# Patient Record
Sex: Female | Born: 1965 | Race: White | Hispanic: No | Marital: Married | Smoking: Current every day smoker
Health system: Southern US, Community
[De-identification: ages and names within clinical notes are randomized; demographics above are authoritative.]

## PROBLEM LIST (undated history)

## (undated) DIAGNOSIS — R319 Hematuria, unspecified: Secondary | ICD-10-CM

## (undated) DIAGNOSIS — A6000 Herpesviral infection of urogenital system, unspecified: Secondary | ICD-10-CM

## (undated) DIAGNOSIS — K219 Gastro-esophageal reflux disease without esophagitis: Secondary | ICD-10-CM

## (undated) DIAGNOSIS — N3281 Overactive bladder: Secondary | ICD-10-CM

## (undated) DIAGNOSIS — E669 Obesity, unspecified: Secondary | ICD-10-CM

## (undated) DIAGNOSIS — R0683 Snoring: Secondary | ICD-10-CM

## (undated) DIAGNOSIS — N879 Dysplasia of cervix uteri, unspecified: Secondary | ICD-10-CM

## (undated) DIAGNOSIS — N898 Other specified noninflammatory disorders of vagina: Secondary | ICD-10-CM

## (undated) DIAGNOSIS — N3946 Mixed incontinence: Secondary | ICD-10-CM

## (undated) DIAGNOSIS — I2699 Other pulmonary embolism without acute cor pulmonale: Secondary | ICD-10-CM

## (undated) DIAGNOSIS — M797 Fibromyalgia: Secondary | ICD-10-CM

## (undated) DIAGNOSIS — Z8639 Personal history of other endocrine, nutritional and metabolic disease: Secondary | ICD-10-CM

## (undated) DIAGNOSIS — I639 Cerebral infarction, unspecified: Secondary | ICD-10-CM

## (undated) DIAGNOSIS — G35 Multiple sclerosis: Secondary | ICD-10-CM

## (undated) DIAGNOSIS — J449 Chronic obstructive pulmonary disease, unspecified: Secondary | ICD-10-CM

## (undated) DIAGNOSIS — I82409 Acute embolism and thrombosis of unspecified deep veins of unspecified lower extremity: Secondary | ICD-10-CM

## (undated) DIAGNOSIS — R3129 Other microscopic hematuria: Principal | ICD-10-CM

## (undated) DIAGNOSIS — Z862 Personal history of diseases of the blood and blood-forming organs and certain disorders involving the immune mechanism: Secondary | ICD-10-CM

## (undated) DIAGNOSIS — I10 Essential (primary) hypertension: Secondary | ICD-10-CM

## (undated) DIAGNOSIS — F419 Anxiety disorder, unspecified: Secondary | ICD-10-CM

## (undated) HISTORY — DX: Personal history of other endocrine, nutritional and metabolic disease: Z86.39

## (undated) HISTORY — PX: BLADDER SUSPENSION: SHX72

## (undated) HISTORY — DX: Other microscopic hematuria: R31.29

## (undated) HISTORY — DX: Dysplasia of cervix uteri, unspecified: N87.9

## (undated) HISTORY — PX: LASER ABLATION CONDYLOMA CERVICAL / VULVAR: SUR819

## (undated) HISTORY — PX: OTHER SURGICAL HISTORY: SHX169

## (undated) HISTORY — DX: Other pulmonary embolism without acute cor pulmonale: I26.99

## (undated) HISTORY — DX: Multiple sclerosis: G35

## (undated) HISTORY — DX: Hematuria, unspecified: R31.9

## (undated) HISTORY — DX: Fibromyalgia: M79.7

## (undated) HISTORY — DX: Snoring: R06.83

## (undated) HISTORY — PX: DILATION AND CURETTAGE OF UTERUS: SHX78

## (undated) HISTORY — PX: ABDOMINAL HYSTERECTOMY: SHX81

## (undated) HISTORY — DX: Mixed incontinence: N39.46

## (undated) HISTORY — DX: Obesity, unspecified: E66.9

## (undated) HISTORY — DX: Overactive bladder: N32.81

## (undated) HISTORY — DX: Chronic obstructive pulmonary disease, unspecified: J44.9

## (undated) HISTORY — DX: Other specified noninflammatory disorders of vagina: N89.8

## (undated) HISTORY — DX: Herpesviral infection of urogenital system, unspecified: A60.00

## (undated) HISTORY — PX: CHOLECYSTECTOMY: SHX55

---

## 2013-08-22 ENCOUNTER — Ambulatory Visit: Payer: Self-pay | Admitting: Obstetrics and Gynecology

## 2013-09-30 ENCOUNTER — Emergency Department: Payer: Self-pay | Admitting: Emergency Medicine

## 2014-03-20 ENCOUNTER — Ambulatory Visit: Payer: Self-pay | Admitting: Oncology

## 2014-03-30 ENCOUNTER — Inpatient Hospital Stay: Payer: Self-pay | Admitting: Internal Medicine

## 2014-03-30 LAB — CBC WITH DIFFERENTIAL/PLATELET
BASOS ABS: 0 10*3/uL (ref 0.0–0.1)
BASOS PCT: 0.3 %
Eosinophil #: 0.1 10*3/uL (ref 0.0–0.7)
Eosinophil %: 0.7 %
HCT: 39.9 % (ref 35.0–47.0)
HGB: 13.2 g/dL (ref 12.0–16.0)
LYMPHS PCT: 24.5 %
Lymphocyte #: 2.5 10*3/uL (ref 1.0–3.6)
MCH: 30.9 pg (ref 26.0–34.0)
MCHC: 33 g/dL (ref 32.0–36.0)
MCV: 94 fL (ref 80–100)
Monocyte #: 0.7 x10 3/mm (ref 0.2–0.9)
Monocyte %: 6.9 %
NEUTROS ABS: 7 10*3/uL — AB (ref 1.4–6.5)
NEUTROS PCT: 67.6 %
PLATELETS: 150 10*3/uL (ref 150–440)
RBC: 4.27 10*6/uL (ref 3.80–5.20)
RDW: 13.4 % (ref 11.5–14.5)
WBC: 10.3 10*3/uL (ref 3.6–11.0)

## 2014-03-30 LAB — CBC
HCT: 41.1 % (ref 35.0–47.0)
HGB: 13.5 g/dL (ref 12.0–16.0)
MCH: 30.9 pg (ref 26.0–34.0)
MCHC: 32.9 g/dL (ref 32.0–36.0)
MCV: 94 fL (ref 80–100)
Platelet: 165 10*3/uL (ref 150–440)
RBC: 4.38 10*6/uL (ref 3.80–5.20)
RDW: 13.3 % (ref 11.5–14.5)
WBC: 11.5 10*3/uL — ABNORMAL HIGH (ref 3.6–11.0)

## 2014-03-30 LAB — COMPREHENSIVE METABOLIC PANEL
ALBUMIN: 3.1 g/dL — AB (ref 3.4–5.0)
ALK PHOS: 73 U/L
Anion Gap: 10 (ref 7–16)
BUN: 13 mg/dL (ref 7–18)
Bilirubin,Total: 0.4 mg/dL (ref 0.2–1.0)
CALCIUM: 8.5 mg/dL (ref 8.5–10.1)
CO2: 26 mmol/L (ref 21–32)
CREATININE: 0.94 mg/dL (ref 0.60–1.30)
Chloride: 104 mmol/L (ref 98–107)
Glucose: 88 mg/dL (ref 65–99)
Osmolality: 279 (ref 275–301)
POTASSIUM: 3.1 mmol/L — AB (ref 3.5–5.1)
SGOT(AST): 21 U/L (ref 15–37)
SGPT (ALT): 26 U/L
Sodium: 140 mmol/L (ref 136–145)
TOTAL PROTEIN: 7.1 g/dL (ref 6.4–8.2)

## 2014-03-30 LAB — PROTIME-INR
INR: 0.9
Prothrombin Time: 12.3 secs (ref 11.5–14.7)

## 2014-03-30 LAB — TROPONIN I

## 2014-03-30 LAB — APTT: Activated PTT: 115.1 secs — ABNORMAL HIGH (ref 23.6–35.9)

## 2014-03-31 DIAGNOSIS — I369 Nonrheumatic tricuspid valve disorder, unspecified: Secondary | ICD-10-CM

## 2014-03-31 LAB — BASIC METABOLIC PANEL
Anion Gap: 9 (ref 7–16)
BUN: 18 mg/dL (ref 7–18)
CO2: 26 mmol/L (ref 21–32)
Calcium, Total: 8.3 mg/dL — ABNORMAL LOW (ref 8.5–10.1)
Chloride: 104 mmol/L (ref 98–107)
Creatinine: 0.83 mg/dL (ref 0.60–1.30)
EGFR (Non-African Amer.): >60
Glucose: 111 mg/dL — ABNORMAL HIGH (ref 65–99)
Osmolality: 280 (ref 275–301)
Potassium: 3.6 mmol/L (ref 3.5–5.1)
Sodium: 139 mmol/L (ref 136–145)

## 2014-03-31 LAB — CBC WITH DIFFERENTIAL/PLATELET
BASOS PCT: 0.8 %
Basophil #: 0.1 10*3/uL (ref 0.0–0.1)
EOS ABS: 0.1 10*3/uL (ref 0.0–0.7)
Eosinophil %: 1.1 %
HCT: 39 % (ref 35.0–47.0)
HGB: 12.9 g/dL (ref 12.0–16.0)
Lymphocyte #: 2.4 10*3/uL (ref 1.0–3.6)
Lymphocyte %: 25.2 %
MCH: 31 pg (ref 26.0–34.0)
MCHC: 33.1 g/dL (ref 32.0–36.0)
MCV: 94 fL (ref 80–100)
Monocyte #: 0.7 x10 3/mm (ref 0.2–0.9)
Monocyte %: 7 %
NEUTROS PCT: 65.9 %
Neutrophil #: 6.3 10*3/uL (ref 1.4–6.5)
Platelet: 149 10*3/uL — ABNORMAL LOW (ref 150–440)
RBC: 4.18 10*6/uL (ref 3.80–5.20)
RDW: 13.4 % (ref 11.5–14.5)
WBC: 9.6 10*3/uL (ref 3.6–11.0)

## 2014-03-31 LAB — HEPARIN LEVEL (UNFRACTIONATED): Anti-Xa(Unfractionated): 0.41 IU/mL (ref 0.30–0.70)

## 2014-04-01 LAB — CBC WITH DIFFERENTIAL/PLATELET
Basophil #: 0 10*3/uL (ref 0.0–0.1)
Basophil %: 0.5 %
Eosinophil #: 0.1 10*3/uL (ref 0.0–0.7)
Eosinophil %: 2.1 %
HCT: 37.9 % (ref 35.0–47.0)
HGB: 12.9 g/dL (ref 12.0–16.0)
Lymphocyte #: 1.2 10*3/uL (ref 1.0–3.6)
Lymphocyte %: 17.6 %
MCH: 31.2 pg (ref 26.0–34.0)
MCHC: 34 g/dL (ref 32.0–36.0)
MCV: 92 fL (ref 80–100)
MONO ABS: 0.5 x10 3/mm (ref 0.2–0.9)
Monocyte %: 7 %
NEUTROS PCT: 72.8 %
Neutrophil #: 5 10*3/uL (ref 1.4–6.5)
Platelet: 144 10*3/uL — ABNORMAL LOW (ref 150–440)
RBC: 4.12 10*6/uL (ref 3.80–5.20)
RDW: 13.1 % (ref 11.5–14.5)
WBC: 6.9 10*3/uL (ref 3.6–11.0)

## 2014-04-02 LAB — CBC WITH DIFFERENTIAL/PLATELET
Basophil #: 0 10*3/uL (ref 0.0–0.1)
Basophil %: 0.3 %
EOS ABS: 0.2 10*3/uL (ref 0.0–0.7)
Eosinophil %: 1.9 %
HCT: 39 % (ref 35.0–47.0)
HGB: 13.5 g/dL (ref 12.0–16.0)
LYMPHS ABS: 1.3 10*3/uL (ref 1.0–3.6)
Lymphocyte %: 12.6 %
MCH: 32 pg (ref 26.0–34.0)
MCHC: 34.6 g/dL (ref 32.0–36.0)
MCV: 92 fL (ref 80–100)
Monocyte #: 0.6 x10 3/mm (ref 0.2–0.9)
Monocyte %: 5.7 %
NEUTROS ABS: 8.2 10*3/uL — AB (ref 1.4–6.5)
NEUTROS PCT: 79.5 %
Platelet: 144 10*3/uL — ABNORMAL LOW (ref 150–440)
RBC: 4.22 10*6/uL (ref 3.80–5.20)
RDW: 13.2 % (ref 11.5–14.5)
WBC: 10.3 10*3/uL (ref 3.6–11.0)

## 2014-04-04 LAB — CULTURE, BLOOD (SINGLE)

## 2014-04-06 DIAGNOSIS — R0683 Snoring: Secondary | ICD-10-CM | POA: Insufficient documentation

## 2014-04-06 DIAGNOSIS — I2699 Other pulmonary embolism without acute cor pulmonale: Secondary | ICD-10-CM | POA: Insufficient documentation

## 2014-04-06 DIAGNOSIS — Z8639 Personal history of other endocrine, nutritional and metabolic disease: Secondary | ICD-10-CM

## 2014-04-06 DIAGNOSIS — J449 Chronic obstructive pulmonary disease, unspecified: Secondary | ICD-10-CM | POA: Insufficient documentation

## 2014-04-06 HISTORY — DX: Personal history of other endocrine, nutritional and metabolic disease: Z86.39

## 2014-04-12 ENCOUNTER — Emergency Department: Payer: Self-pay | Admitting: Student

## 2014-04-12 LAB — CBC WITH DIFFERENTIAL/PLATELET
BASOS PCT: 1.3 %
Basophil #: 0.1 10*3/uL (ref 0.0–0.1)
EOS ABS: 0.2 10*3/uL (ref 0.0–0.7)
Eosinophil %: 3.3 %
HCT: 37 % (ref 35.0–47.0)
HGB: 12.5 g/dL (ref 12.0–16.0)
Lymphocyte #: 1.5 10*3/uL (ref 1.0–3.6)
Lymphocyte %: 22.3 %
MCH: 30.5 pg (ref 26.0–34.0)
MCHC: 33.7 g/dL (ref 32.0–36.0)
MCV: 91 fL (ref 80–100)
MONO ABS: 0.4 x10 3/mm (ref 0.2–0.9)
Monocyte %: 6.2 %
NEUTROS PCT: 66.9 %
Neutrophil #: 4.4 10*3/uL (ref 1.4–6.5)
PLATELETS: 278 10*3/uL (ref 150–440)
RBC: 4.08 10*6/uL (ref 3.80–5.20)
RDW: 13 % (ref 11.5–14.5)
WBC: 6.6 10*3/uL (ref 3.6–11.0)

## 2014-04-12 LAB — BASIC METABOLIC PANEL
Anion Gap: 8 (ref 7–16)
BUN: 13 mg/dL (ref 7–18)
CREATININE: 0.74 mg/dL (ref 0.60–1.30)
Calcium, Total: 9.1 mg/dL (ref 8.5–10.1)
Chloride: 103 mmol/L (ref 98–107)
Co2: 28 mmol/L (ref 21–32)
EGFR (African American): >60
GLUCOSE: 98 mg/dL (ref 65–99)
OSMOLALITY: 278 (ref 275–301)
Potassium: 3.9 mmol/L (ref 3.5–5.1)
SODIUM: 139 mmol/L (ref 136–145)

## 2014-04-12 LAB — WET PREP, GENITAL

## 2014-04-12 LAB — URINALYSIS, COMPLETE
Bilirubin,UR: NEGATIVE
GLUCOSE, UR: NEGATIVE mg/dL (ref 0–75)
Ketone: NEGATIVE
Nitrite: NEGATIVE
Ph: 5 (ref 4.5–8.0)
Protein: NEGATIVE
RBC,UR: 3 /HPF (ref 0–5)
SPECIFIC GRAVITY: 1.01 (ref 1.003–1.030)
Squamous Epithelial: <1
WBC UR: 7 /HPF (ref 0–5)

## 2014-04-12 LAB — PROTIME-INR
INR: 1.5
Prothrombin Time: 17.8 secs — ABNORMAL HIGH (ref 11.5–14.7)

## 2014-04-13 LAB — GC/CHLAMYDIA PROBE AMP

## 2014-04-14 LAB — URINE CULTURE

## 2014-04-19 ENCOUNTER — Ambulatory Visit: Payer: Self-pay | Admitting: Oncology

## 2014-04-24 ENCOUNTER — Ambulatory Visit: Payer: Self-pay | Admitting: Oncology

## 2014-05-20 ENCOUNTER — Ambulatory Visit: Payer: Self-pay | Admitting: Oncology

## 2014-05-29 ENCOUNTER — Ambulatory Visit: Payer: Self-pay | Admitting: Urology

## 2014-05-30 ENCOUNTER — Ambulatory Visit: Payer: Self-pay | Admitting: Specialist

## 2014-07-25 ENCOUNTER — Emergency Department: Payer: Self-pay | Admitting: Emergency Medicine

## 2014-11-10 NOTE — Consult Note (Signed)
Note Type Consult   HPI: This 49 year old Female patient presents to the clinic for initial evaluation of  pulmonary embolism, DVT.  Subjective: Chief Complaint/Diagnosis:   bilateral pulmonary embolism, DVT HPI:   Patient is a 49 year old female with past medical history significant for multiple sclerosis as well as a CVA at the age of 36. Patient taste she is noted swelling and pain in her right lower generally several weeks ago. She was evaluated at 2 different hospitals for possible DVT but reports that the ultrasound was negative. Over the past week, she became acutely short of breath and and weak. Upon further evaluation, she was noted to have bilateral PEs. Currently, she feels well and is asymptomatic. She has no neurologic complaints. She denies any recent fevers. She has a good appetite and denies weight loss. She has no nausea, vomiting, constipation, or diarrhea. Patient otherwise feels well and offers no further specific complaints.   Review of Systems:  Performance Status (ECOG): 0  Review of Systems:   As per HPI. Otherwise, 10 point system review was negative.   Allergies:  Cefaclor: Hives, Angioedema  Penicillin: Hives  Erythromycin: GI Distress  Biaxin: GI Distress  PFSH: Additional Past Medical and Surgical History: multiple sclerosis, CVA age 74, partial hysterectomy, cholecystectomy, fibromyalgia.    Family history: Negative and noncontributory. No reported history of clotting disorder.    Social history: Positive tobacco, unclear how much. Denies alcohol.   Home Medications: Medication Instructions Last Modified Date/Time  Xarelto Starter Pack 15 mg-20 mg oral tablet 1 unit(s) orally  12-Sep-15 09:34  Tylenol Caplet 325 mg oral tablet 2 tab(s) orally every 4 hours, As Needed - for Pain 11-Sep-15 13:28  Mag-Ox 400 1 tab(s)  once a day 11-Sep-15 13:26  cholecalciferol  orally  11-Sep-15 13:27  cyanocobalamin  orally  11-Sep-15 13:27  niacin  orally   11-Sep-15 13:27  albuterol 90 mcg/inh inhalation powder 2 puff(s) inhaled every 4 hours, As Needed - for Shortness of Breath 11-Sep-15 13:28  Flonase 50 mcg/inh nasal spray 1 spray(s) nasal once a day 11-Sep-15 13:28  VESIcare 5 mg oral tablet 1 tab(s) orally once a day 11-Sep-15 13:28  NexIUM 40 mg oral delayed release capsule 1 cap(s) orally once a day 11-Sep-15 13:28  Lyrica 150 mg oral capsule 1 cap(s) orally 2 times a day 11-Sep-15 13:28  Voltaren 75 milligram(s) orally 2 times a day 11-Sep-15 13:28  Zoloft 100 mg oral tablet 1 tab(s) orally once a day 11-Sep-15 13:28  Antivert 25 mg oral tablet 1 tab(s) orally 3 times a day, As Needed 11-Sep-15 13:28   Vital Signs:  :: vital signs stable, patient afebrile.   Physical Exam:  General: well developed, well nourished, and in no acute distress  Mental Status: normal affect  Eyes: anicteric sclera  Head, Ears, Nose,Throat: Normocephalic, moist mucous membranes, clear oropharynx without erythema or thrush.  Neck, Thyroid: No palpable lymphadenopathy, thyroid midline without nodules.  Respiratory: clear to auscultation bilaterally  Cardiovascular: regular rate and rhythm, no murmur, rub, or gallop  Gastrointestinal: soft, nondistended, nontender, no organomegaly.  normal active bowel sounds  Musculoskeletal: No edema  Skin: No rash or petechiae noted  Neurological: alert, answering all questions appropriately.  Cranial nerves grossly intact   Medical Imaging Results:   Review Medical Imaging   CT ANGIOGRAPHY Chest with for PE 30-Mar-2014 15:47:00: IMPRESSION:  Moderate-large volume bilateral pulmonary emboli with CT evidence of  right heart strain (RV/LV Ratio = 1.1) consistent with at  least  submassive (intermediate risk) PE. The presence of right heart  strain has been associated with an increased risk of morbidity and  mortality.    Scattered ground-glass and airspace opacities compatible with  changes from pulmonary emboli, some likely  representing developing  infarction.    Possible mild splenomegaly.    Critical Value/emergent results were called by telephone at the time  of interpretation on 03/30/2014 at 4:03 pm to Dr. Glennie Isle ,  who verbally acknowledged these results.      Electronically Signed    By: Laveda Abbe M.D.    On: 03/30/2014 16:03         Verified By: Rosendo Gros, M.D., 31-Mar-2014 12:01:00: (no IMPRESSION found) Color Flow Doppler Low Extrem Bilat (Legs) 31-Mar-2014 12:01:00: IMPRESSION:  1. No evidence of lower extremity deep vein thrombosis,left lower  extremity.    2.  Nonocclusive right popliteal vein DVT.      Electronically Signed    By: Oley Balm M.D.    On: 03/31/2014 12:19         Verified By: Philis Fendt, M.D.,  Assessment and Plan: Impression:   Bilateral pulmonary embolism with right lower generally DVT Plan:   1. Bilateral PE/DVT: Patient does have a history of stroke at age 22, but may have been secondary to birth control she was taking at the time. She has had no intermittent history of DVT or clot. She has a transient risk factor of multiple trips to [place] ranging from 3-8 hours. She also smoke cigarettes. She does not appear to have any other risk factors. A full hypercoagulable workup has been ordered and is currently pending. Agree with Xarelto. Patient will require a total of 6 months of treatment. [facility] 3 weeks to discuss her results and further evaluation. She expressed understanding and was in agreement with this plan. consult, call with questions.  Fax to Physician:  [facility] To Rec[facility] Fax: Glennie Isle, MD - [phone].  Electronic Signatures: Gerarda Fraction (MD)  (Signed 12-Sep-15 14:57)  Authored: Note Type, History of Present Illness, CC/HPI, Review of Systems, ALLERGIES, Patient Family Social History, HOME MEDICATIONS, Vital Signs, Physical Exam, Rad Results Review, Assessment and Plan, Fax to  Physician   Last Updated: 12-Sep-15 14:57 by Gerarda Fraction (MD)

## 2014-11-10 NOTE — H&P (Signed)
PATIENT NAME:  Tracey Chandler, Tracey Chandler MR#:  [id number] DATE OF BIRTH:  14-Oct-1965  DATE OF ADMISSION:  03/30/2014  PRIMARY CARE PHYSICIAN:  In [place].   REFERRING EMERGENCY ROOM PHYSICIAN: Glennie Isle, MD   CHIEF COMPLAINT: Shortness of breath with exertion.   HISTORY OF PRESENT ILLNESS: A 49 year old female who has history of stroke at 49 years of age with oral contraceptive pills.  Had a history of hypertension, but she was very obese and she lost 63 pounds and then never required the blood pressure medication; has depression and multiple sclerosis.  She went to see her family in [place] 8 days ago, and since then she was feeling short of breath over there, they drove from here, then it was only a 3-hour drive. As her insurance was from [place], [place] border to [place], [place], which is only 35 miles from where they were in [place]; that was last week and went to the Emergency Room for this shortness of breath. After doing initial work-up and chest x-ray, as everything was negative, she was told that because she is a smoker for many years, maybe she has COPD and was given inhalers and oral steroids and sent home.   In spite of taking the treatment for the COPD, the patient continued to feel worse and, as per her,  as long as she is sitting in on the couch she is feeling fine but, when she gets up and tries to do some work, she feels extremely short of breath and cannot do it, which is very unusual for her. Other than that, she denies any feeling of chest pain or dizziness. She denies any swelling on the legs or pain. She noticed some palpitations sometimes with this shortness of breath, but denies any losing weight or noticing any blood loss.  So, came to Emergency Room finally as she returned back home today and ER physician did a chest x-ray, which showed some questionable pneumonia or atelectasis and, to get further diagnosis, she did a CT  scan of the chest, which shows bilateral submassive pulmonary embolism, so given to hospitalist team as the patient is very symptomatic.   REVIEW OF SYSTEMS:   CONSTITUTIONAL: Positive for fatigue and generalized weakness with shortness of breath. No weight loss or weight gain. No pain.  EYES: No blurring, double vision, discharge, or redness.  EARS, NOSE, THROAT: No tinnitus, ear pain, or hearing loss.  RESPIRATORY: No cough, wheezing, hemoptysis, but the patient has shortness of breath.  CARDIOVASCULAR: No chest pain, orthopnea, edema, or arrhythmia, but has some palpitations feelings with shortness of breath sometimes.   GASTROINTESTINAL: No nausea, vomiting, diarrhea, abdominal pain.  GENITOURINARY: No dysuria, hematuria, or increased frequency.  ENDOCRINE: No heat or cold intolerance. No excessive sweating. No acne or rashes.  MUSCULOSKELETAL: No pain or swelling in the joints.  NEUROLOGICAL: No numbness, weakness, tremor, or vertigo.  PSYCHIATRIC: No anxiety, insomnia, bipolar disorder.   PAST MEDICAL HISTORY: 1.  Stroke at the age of 52 years when she was on oral contraceptive pills for 6 weeks and after that she was told never to take oral contraceptive pills again.   2.  Hypertension.  She was on medication for a few years, but she was morbidly obese and she lost 63 pounds and after that, she was able to come off the medication and her pressure is remaining fine.  3.  Depression.  4.  Multiple sclerosis.  She mentions  that multiple sclerosis is not very highly symptomatic issue with her.  It is  like slow progressive, and does not have many exacerbations.  5.  She had 2 miscarriages, but the reason for that was her thin cervix,  as per her.   PAST SURGICAL HISTORY:  1.  D and C was done.  2.  Cervical laser surgery was done.   3.  Hysterectomy.  4.  Bladder sling surgery.   SOCIAL HISTORY: She is a smoker of 1/2 pack of cigarettes per day for many years.   FAMILY HISTORY: No  history of blood clots in the family, but her aunt had breast cancer.   SOCIAL HISTORY: She denies alcohol or illegal drug use, and she is on disability since almost 20 years because of her multiple sclerosis.   HOME MEDICATIONS:  1.  Zoloft 100 mg oral tablet once a day.  3.  VESIcare 5 mg oral tablet once a day.  4.  Tylenol 325 mg 2 tablets every 4 hours.  5.  Nexium 40 mg oral once a day.  6.  Magnesium oxide 400 mg oral once a day.  7.  Lyrica 150 mg oral capsule 2 times a day.  8.  Flonase 1 spray once a day.  9.  Vitamin B12 once a day.  10.  Cholecalciferol 3 times a day.  11.  Antivert 25 mg oral tablet 3 times a day.  12. Albuterol inhaler 2 puff inhalation every 4 hours as needed.   PHYSICAL EXAMINATION:  VITAL SIGNS: In the ER, temperature 97.9, pulse 91, respirations 20, blood pressure was 145/72, pulse oximetry 98% on room air.  GENERAL: The patient is fully alert and oriented to time, place, and person, slightly obese, does not appear in any acute distress.  HEENT: Head and neck atraumatic. Conjunctivae pink. Oral mucosa moist.  NECK: Supple. No JVD.  RESPIRATORY: Bilateral equal and clear air entry.  CARDIOVASCULAR: S1, S2 present, regular. No murmur.  ABDOMEN: Soft, nontender, bowel sounds present. No organomegaly.  SKIN: No rashes.  JOINTS: No swelling or tenderness.  LEGS:  No edema. No calf tenderness.  NEUROLOGICAL: Power 5/5. Follows commands. Moves all 4 limbs. No tremor or rigidity. No numbness.  PSYCHIATRIC: Does not appear in any acute psychiatric illness at this time.   IMAGING: Chest x-ray, PA and lateral, shows hazy right upper lobe and left mid-lung air space disease. No other focal parenchymal opacity.  CT angiogram of the chest shows moderate large volume bilateral pulmonary emboli, right heart strain consistent with submassive pulmonary embolism, scattered ground-glass airspace opacity, compatible with changes from pulmonary emboli, possible mild  splenomegaly.   LABORATORY DATA: Glucose 88. BUN 13, creatinine 0.94, sodium 140, potassium 3.1, chloride 104, CO2 of 26, calcium 8.5. Total protein 7.1, albumin 3.1, bilirubin 0.4, alkaline phosphatase 73, SGOT 21 and SGPT 26. Troponin less than 0.02. WBC 11.5, came down to 10.3 and hemoglobin 13.2, platelet count of 150,000. INR is 0.9 and prothrombin time 12.3.   ASSESSMENT AND PLAN: A 49 year old female with history of stroke on oral contraceptive pills in a very young age has hypertension, 2 miscarriages in the past and depression, and multiple sclerosis, came to Emergency Room because of severe shortness of breath on exertion and found having submassive pulmonary embolism.  1.  Pulmonary embolism. The patient is started on hepatin IV drip by Emergency Room physician and I will continue that for now and maybe from tomorrow we will switch her to Xarelto for further  management as outpatient. Meanwhile, we need to do some work-up over here including her echocardiogram.  We will call oncology consult for further management as the patient had history of 2 miscarriages, had a stroke at a young age on oral contraceptives, and has multiple sclerosis. Thrombotic work-up has been ordered. The patient already had an appointment as outpatient with Dr. Meredeth Ide on the 28th of September. I encouraged her to continue to follow with that appointment for further management as she does not have any primary medical doctor in [place] yet.  2.  Exertional shortness of breath and right heart strain on CAT scan. We will get echocardiogram to evaluate further and, if needed, then we might have to call cardiology, but right now there is no need.   3.  History of hypertension. Blood pressure is stable. No need for medication at this time.  4.  Depression. We will continue Zoloft and Lyrica.   5.  Tobacco abuse. Smoking cessation counseling is done for 4 minutes and the patient agreed not to smoke anymore now. I encouraged her  to use nicotine patch if she requires, and she understands and agrees. Total time spent for smoking cessation counseling 4 minutes.   I discussed the plan with the patient and her husband and explained to him about the outcome.   TIME SPENT IN THIS ADMISSION:  Is 50 minutes.     ____________________________ Hope Pigeon Elisabeth Pigeon, MD [id number] D: 03/30/2014 17:03:20 ET T: 03/30/2014 19:20:09 ET JOB#: [id number]  cc: Hope Pigeon. Elisabeth Pigeon, MD, <Dictator> Altamese Dilling MD ELECTRONICALLY SIGNED 04/21/2014 12:47

## 2014-11-10 NOTE — Discharge Summary (Signed)
PATIENT NAME:  Tracey Chandler, Tracey Chandler MR#:  [id number] DATE OF BIRTH:  [birth year]  DATE OF ADMISSION:  03/30/2014  DATE OF DISCHARGE:  04/02/2014   DISCHARGE DIAGNOSES:  1.  Bilateral pulmonary embolism.  2.  Tobacco abuse.  3.  Pulmonary infarction.  4.  Acute respiratory failure.   CONSULTANTS: Dr. Orlie Dakin with hematology.   IMAGING STUDIES: Include a: 1.   Chest x-ray which showed bilateral infiltrates similar to the infarctions on CT scan.   2.  CT scan of the chest showed bilateral pulmonary embolism.   ADMITTING HISTORY AND PHYSICAL: Please see detailed H and P dictated by Dr. Elisabeth Pigeon.   In brief, a 49 year old female patient with history of prior miscarriages, a stroke at a young age, presented to the hospital complaining of shortness of breath with exertion.  The patient had a CT done which showed bilateral pulmonary embolism and admitted to hospitalist service.    HOSPITAL COURSE:   1.  Bilateral PE with pulmonary infarctions. The patient initially was started on a heparin drip which is transitioned to Xarelto. Hypercoagulable work-up has been ordered, which is pending at this time and she will follow up with hematology, Dr. Orlie Dakin as outpatient who has seen in the hospital for her hypercoagulability and further anticoagulation management. The patient did have acute respiratory failure secondary to pulmonary embolism which is slowly improving, but still needing oxygen at time of discharge due to saturation of 85% on room air on ambulation and this has been set up.  The patient does feel significantly better.  Lower extremity ultrasound was done which showed nonocclusive right popliteal deep vein thrombosis.  2.  Echocardiogram showed ejection fraction of 60% to 65%, without any RV strain.  3.  Tobacco abuse. The patient was counseled to quit smoking during the admission.   PHYSICAL EXAMINATION:  Prior to discharge, the patient's lungs sound clear. S1, S2 heard without any murmurs. No edema  in the lower extremities, has ambulated without any assistance.   DISCHARGE MEDICATIONS:  1.  Zoloft 100 mg oral once a day.  2.  Voltaren 75 mg oral 2 times a day.  3.  Lyrica 150 mg oral 2 times a day.  4.  Nexium 40 mg daily.  5.  VESIcare 5 mg daily.  6.  Antivert 25 mg oral 3 times a day as needed.  7.  Tylenol 325 mg 2 tablets every 4 hours as needed for pain.  8.  Magnesium oxide 400 mg daily.  9.  Cholecalciferol 1 tablet daily.  10. Cyanocobalamin 1 tablet daily.  11. Niacin 1 tablet daily.  12. Albuterol 2 puffs inhaled every 4 hours as needed for shortness of breath.  13. Flonase 50 mcg inhaled once a day.  14. Xarelto starter pack given. Further prescription per Dr. Orlie Dakin.  14. Norco 5/325 1 tablet every 6 hours as needed for pain.   DISCHARGE INSTRUCTIONS: Home oxygen 2 liters nasal cannula continuous, low fat, low cholesterol diet. Activity as tolerated.  Follow up with Dr. Orlie Dakin and Dr. Meredeth Ide in 1 to 2 weeks.   Time Spent ON day of discharge in discharge activity: 40 minutes.     ____________________________ Molinda Bailiff Jacody Beneke, MD srs:DT D: 04/03/2014 14:31:42 ET T: 04/03/2014 15:13:40 ET JOB#: [id number]  cc: Wardell Heath R. Randal Yepiz, MD, <Dictator> Tollie Pizza. Orlie Dakin, MD Herbon E. Meredeth Ide, MD Orie Fisherman MD ELECTRONICALLY SIGNED 04/07/2014 14:31

## 2014-11-19 DIAGNOSIS — N898 Other specified noninflammatory disorders of vagina: Secondary | ICD-10-CM

## 2014-11-19 HISTORY — DX: Other specified noninflammatory disorders of vagina: N89.8

## 2015-02-19 DIAGNOSIS — G35 Multiple sclerosis: Secondary | ICD-10-CM | POA: Diagnosis not present

## 2015-02-19 DIAGNOSIS — J449 Chronic obstructive pulmonary disease, unspecified: Secondary | ICD-10-CM | POA: Diagnosis not present

## 2015-02-19 DIAGNOSIS — Z8639 Personal history of other endocrine, nutritional and metabolic disease: Secondary | ICD-10-CM | POA: Diagnosis not present

## 2015-02-19 DIAGNOSIS — I2699 Other pulmonary embolism without acute cor pulmonale: Secondary | ICD-10-CM | POA: Diagnosis not present

## 2015-02-28 DIAGNOSIS — Z6836 Body mass index (BMI) 36.0-36.9, adult: Secondary | ICD-10-CM | POA: Diagnosis not present

## 2015-02-28 DIAGNOSIS — J00 Acute nasopharyngitis [common cold]: Secondary | ICD-10-CM | POA: Diagnosis not present

## 2015-02-28 DIAGNOSIS — R635 Abnormal weight gain: Secondary | ICD-10-CM | POA: Diagnosis not present

## 2015-02-28 DIAGNOSIS — M545 Low back pain: Secondary | ICD-10-CM | POA: Diagnosis not present

## 2015-03-03 DIAGNOSIS — J449 Chronic obstructive pulmonary disease, unspecified: Secondary | ICD-10-CM | POA: Diagnosis not present

## 2015-03-03 DIAGNOSIS — F0390 Unspecified dementia without behavioral disturbance: Secondary | ICD-10-CM | POA: Diagnosis not present

## 2015-03-03 DIAGNOSIS — I10 Essential (primary) hypertension: Secondary | ICD-10-CM | POA: Diagnosis not present

## 2015-03-03 DIAGNOSIS — F17200 Nicotine dependence, unspecified, uncomplicated: Secondary | ICD-10-CM | POA: Diagnosis not present

## 2015-04-02 DIAGNOSIS — Z6836 Body mass index (BMI) 36.0-36.9, adult: Secondary | ICD-10-CM | POA: Diagnosis not present

## 2015-04-02 DIAGNOSIS — R635 Abnormal weight gain: Secondary | ICD-10-CM | POA: Diagnosis not present

## 2015-04-30 DIAGNOSIS — Z23 Encounter for immunization: Secondary | ICD-10-CM | POA: Diagnosis not present

## 2015-04-30 DIAGNOSIS — Z0001 Encounter for general adult medical examination with abnormal findings: Secondary | ICD-10-CM | POA: Diagnosis not present

## 2015-04-30 DIAGNOSIS — G35 Multiple sclerosis: Secondary | ICD-10-CM | POA: Diagnosis not present

## 2015-04-30 DIAGNOSIS — D6852 Prothrombin gene mutation: Secondary | ICD-10-CM | POA: Diagnosis not present

## 2015-04-30 DIAGNOSIS — N938 Other specified abnormal uterine and vaginal bleeding: Secondary | ICD-10-CM | POA: Diagnosis not present

## 2015-04-30 DIAGNOSIS — E6609 Other obesity due to excess calories: Secondary | ICD-10-CM | POA: Diagnosis not present

## 2015-04-30 DIAGNOSIS — M545 Low back pain: Secondary | ICD-10-CM | POA: Diagnosis not present

## 2015-04-30 DIAGNOSIS — H9193 Unspecified hearing loss, bilateral: Secondary | ICD-10-CM | POA: Diagnosis not present

## 2015-04-30 DIAGNOSIS — Z86711 Personal history of pulmonary embolism: Secondary | ICD-10-CM | POA: Diagnosis not present

## 2015-05-10 ENCOUNTER — Other Ambulatory Visit: Payer: Self-pay | Admitting: Physician Assistant

## 2015-05-10 DIAGNOSIS — R319 Hematuria, unspecified: Secondary | ICD-10-CM

## 2015-05-16 ENCOUNTER — Ambulatory Visit
Admission: RE | Admit: 2015-05-16 | Discharge: 2015-05-16 | Disposition: A | Discharge status: Home / Self Care | Payer: Commercial Managed Care - HMO | Source: Ambulatory Visit | Attending: Physician Assistant | Admitting: Physician Assistant

## 2015-05-16 DIAGNOSIS — R3129 Other microscopic hematuria: Secondary | ICD-10-CM | POA: Diagnosis not present

## 2015-05-16 DIAGNOSIS — R319 Hematuria, unspecified: Secondary | ICD-10-CM | POA: Insufficient documentation

## 2015-05-29 ENCOUNTER — Other Ambulatory Visit: Payer: Self-pay

## 2015-05-29 DIAGNOSIS — N3281 Overactive bladder: Secondary | ICD-10-CM

## 2015-05-29 MED ORDER — SOLIFENACIN SUCCINATE 5 MG PO TABS: 5.0000 mg | ORAL_TABLET | Freq: Every day | ORAL | Status: DC

## 2015-05-29 NOTE — Progress Notes (Signed)
Pt has not had a f/u in over a year. 1 month refill was given to get pt to appt. Pt was transferred to the front to make appt.

## 2015-05-31 DIAGNOSIS — R319 Hematuria, unspecified: Secondary | ICD-10-CM | POA: Diagnosis not present

## 2015-05-31 DIAGNOSIS — D519 Vitamin B12 deficiency anemia, unspecified: Secondary | ICD-10-CM | POA: Diagnosis not present

## 2015-05-31 DIAGNOSIS — Z86711 Personal history of pulmonary embolism: Secondary | ICD-10-CM | POA: Diagnosis not present

## 2015-05-31 DIAGNOSIS — N390 Urinary tract infection, site not specified: Secondary | ICD-10-CM | POA: Diagnosis not present

## 2015-05-31 DIAGNOSIS — E6609 Other obesity due to excess calories: Secondary | ICD-10-CM | POA: Diagnosis not present

## 2015-05-31 DIAGNOSIS — Z0001 Encounter for general adult medical examination with abnormal findings: Secondary | ICD-10-CM | POA: Diagnosis not present

## 2015-06-03 ENCOUNTER — Ambulatory Visit (INDEPENDENT_AMBULATORY_CARE_PROVIDER_SITE_OTHER): Payer: Commercial Managed Care - HMO | Admitting: Obstetrics and Gynecology

## 2015-06-03 ENCOUNTER — Encounter: Payer: Self-pay | Admitting: *Deleted

## 2015-06-03 VITALS — BP 125/85 | HR 87 | Resp 16 | Ht 63.0 in | Wt 207.0 lb

## 2015-06-03 DIAGNOSIS — R3129 Other microscopic hematuria: Secondary | ICD-10-CM | POA: Diagnosis not present

## 2015-06-03 DIAGNOSIS — G35 Multiple sclerosis: Secondary | ICD-10-CM | POA: Insufficient documentation

## 2015-06-03 DIAGNOSIS — N3941 Urge incontinence: Secondary | ICD-10-CM | POA: Diagnosis not present

## 2015-06-03 DIAGNOSIS — R32 Unspecified urinary incontinence: Secondary | ICD-10-CM | POA: Diagnosis not present

## 2015-06-03 DIAGNOSIS — M797 Fibromyalgia: Secondary | ICD-10-CM | POA: Insufficient documentation

## 2015-06-03 DIAGNOSIS — N879 Dysplasia of cervix uteri, unspecified: Secondary | ICD-10-CM | POA: Insufficient documentation

## 2015-06-03 DIAGNOSIS — R87619 Unspecified abnormal cytological findings in specimens from cervix uteri: Secondary | ICD-10-CM | POA: Insufficient documentation

## 2015-06-03 HISTORY — DX: Multiple sclerosis: G35

## 2015-06-03 LAB — MICROSCOPIC EXAMINATION
Bacteria, UA: NONE SEEN
RBC, UA: NONE SEEN /hpf (ref 0–?)

## 2015-06-03 LAB — URINALYSIS, COMPLETE
BILIRUBIN UA: NEGATIVE
Glucose, UA: NEGATIVE
Ketones, UA: NEGATIVE
NITRITE UA: NEGATIVE
PH UA: 6 (ref 5.0–7.5)
Protein, UA: NEGATIVE
RBC UA: NEGATIVE
Specific Gravity, UA: 1.02 (ref 1.005–1.030)
UUROB: 0.2 mg/dL (ref 0.2–1.0)

## 2015-06-03 LAB — BLADDER SCAN AMB NON-IMAGING

## 2015-06-03 MED ORDER — MIRABEGRON ER 25 MG PO TB24: 25.0000 mg | ORAL_TABLET | Freq: Every day | ORAL | Status: DC

## 2015-06-03 NOTE — Progress Notes (Signed)
06/03/2015 2:20 PM   Tracey Chandler [birth year] [id number]  Referring provider: Geoffery Lyons, PA [address] [place], [place] [address]  Chief Complaint  Patient presents with  . Urinary Incontinence    HPI: Patient is a 49 year old female with MS and a history of GU issues including urethral discharge/bleeding and OAB symptoms including mixed urinary incontinence.  Symptoms have been well controlled with 5 mg of Vesicare daily in the past. She presents today for her yearly follow-up.  She states that she was told by her PCP recently that there was blood in her UA.  She was asymptomatic and treated with antibiotics.  She reports continued urinary urgency and feels that her Assunta Found is not working as well. She is also complaining of severe dry mouth.  Negative hematuria workup completed 1 year ago.  See below.  Previous History: She reports no significant urinary frequency, voids 3-4 times daily and nocturia x 1. She does have occasional urge incontinence, approximately 1-2 times per month as well as some mild stress incontience. She is currently on Vesicare 5 mg daily x several years, helps with fewer accidents. She does experience dry mouth related to this medication. She sometimes does have sensation of incomplete bladder empyting. PVR at last visit 105 cc.  She has developed urethral discharge x 1 year and more recently urethral bleeding after starting blood thinners. She decribes the discharge as greenish/yellow in color without a significant odor. She denies flank pain, dysuria, or UTIs (rarely, approximately once per year). She denies dysuria, fevers or chills. Recently she was admitted for DVT/ PE started on Xerelto.   She has ha history of TOT midurethral sling 2009. She also has h/o UDS in 2009 (see records, fairly unremarkable).  Ob/GYN at [facility] clinic (Dr. Saintclair Halsted). She is s/p hysterectomy.  She is a former smoker.  CT Urogram 05/29/14 IMPRESSION: No renal,  ureteral, or bladder calculi. No hydronephrosis. 10 mm posterior right upper pole renal cyst. No enhancing renal lesions. Bladder is unremarkable by CT.  Negative cystoscopy 06/05/14  PMH: Past Medical History  Diagnosis Date  . Mixed incontinence   . OAB (overactive bladder)   . Hematuria   . Herpes, genital   . Snores   . Obesity   . COPD (chronic obstructive pulmonary disease) (HCC)   . Pulmonary embolism (HCC)   . Fibromyalgia   . MS (multiple sclerosis) (HCC)   . Cervical dysplasia     Surgical History: Past Surgical History  Procedure Laterality Date  . Abdominal hysterectomy    . Bladder mesh    . Chonca batosa    . Dilation and curettage of uterus    . Laser ablation condyloma cervical / vulvar    . Cholecystectomy      Home Medications:    Medication List       This list is accurate as of: 06/03/15  2:20 PM.  Always use your most recent med list.               [employer] SALINE NASAL SPRAY 0.65 % nasal spray  Generic drug:  sodium chloride  1 spray 2 (two) times daily.     fluticasone 50 MCG/ACT nasal spray  Commonly known as:  FLONASE  Place into the nose.     HYDROcodone-acetaminophen 5-325 MG tablet  Commonly known as:  NORCO/VICODIN  Take 1 tablet by mouth every 8 (eight) hours as needed. for pain     LYRICA 150 MG capsule  Generic drug:  pregabalin  Take 150 mg by mouth 2 (two) times daily.     Magnesium 250 MG Tabs  Take by mouth.     meclizine 25 MG tablet  Commonly known as:  ANTIVERT  Take by mouth.     mirabegron ER 25 MG Tb24 tablet  Commonly known as:  MYRBETRIQ  Take 1 tablet (25 mg total) by mouth daily.     niacin 100 MG tablet  Take by mouth.     pantoprazole 40 MG tablet  Commonly known as:  PROTONIX     Phendimetrazine Tartrate 105 MG Cp24  TAKE ONE CAPSULE BY MOUTH ONCE A DAY FOR WEIGHT LOSS     phentermine 37.5 MG tablet  Commonly known as:  ADIPEX-P  TAKE 1 TABLET BY MOUTH EVERY DAY FOR WEIGHT LOSS     PROAIR  HFA 108 (90 BASE) MCG/ACT inhaler  Generic drug:  albuterol  Inhale into the lungs.     RA VITAMIN B-12 TR 1000 MCG Tbcr  Generic drug:  Cyanocobalamin  Take by mouth.     sertraline 100 MG tablet  Commonly known as:  ZOLOFT  Take by mouth.     solifenacin 5 MG tablet  Commonly known as:  VESICARE  Take 1 tablet (5 mg total) by mouth daily.     valACYclovir 500 MG tablet  Commonly known as:  VALTREX  Take by mouth.     Vitamin D3 2000 UNITS capsule  Take by mouth.     XARELTO 20 MG Tabs tablet  Generic drug:  rivaroxaban  Take 20 mg by mouth daily.        Allergies:  Allergies  Allergen Reactions  . Cefaclor     Other reaction(s): Unknown  . Penicillins     Other reaction(s): Unknown  . Phenytoin     Other reaction(s): Unknown    Family History: Family History  Problem Relation Age of Onset  . Hypertension Mother   . Hypertension Father     Social History:  reports that she has quit smoking. She does not have any smokeless tobacco history on file. She reports that she does not drink alcohol. Her drug history is not on file.  ROS: UROLOGY Frequent Urination?: No Hard to postpone urination?: Yes Burning/pain with urination?: No Get up at night to urinate?: No Leakage of urine?: No Urine stream starts and stops?: No Trouble starting stream?: No Do you have to strain to urinate?: No Blood in urine?: Yes Urinary tract infection?: No Sexually transmitted disease?: No Injury to kidneys or bladder?: No Painful intercourse?: No Weak stream?: No Currently pregnant?: No Vaginal bleeding?: No Last menstrual period?: n  Gastrointestinal Nausea?: No Vomiting?: No Indigestion/heartburn?: No Diarrhea?: No Constipation?: No  Constitutional Fever: No Night sweats?: No Weight loss?: No Fatigue?: No  Skin Skin rash/lesions?: No Itching?: No  Eyes Blurred vision?: No Double vision?: No  Ears/Nose/Throat Sore throat?: No Sinus problems?:  No  Hematologic/Lymphatic Swollen glands?: No Easy bruising?: No  Cardiovascular Leg swelling?: No Chest pain?: No  Respiratory Cough?: No Shortness of breath?: No  Endocrine Excessive thirst?: No  Musculoskeletal Back pain?: No Joint pain?: No  Neurological Headaches?: No Dizziness?: No  Psychologic Depression?: No Anxiety?: No  Physical Exam: BP 125/85 mmHg  Pulse 87  Resp 16  Ht 5\' 3"  (1.6 m)  Wt 207 lb (93.895 kg)  BMI 36.68 kg/m2  Constitutional:  Alert and oriented, No acute distress. HEENT: [place] AT, moist mucus membranes.  Trachea midline, no masses. Cardiovascular:  No clubbing, cyanosis, or edema. Respiratory: Normal respiratory effort, no increased work of breathing. Skin: No rashes, bruises or suspicious lesions. Lymph: No cervical or inguinal adenopathy. Neurologic: Grossly intact, no focal deficits, moving all 4 extremities. Psychiatric: Normal mood and affect.  Laboratory Data:   Urinalysis    Component Value Date/Time   COLORURINE Yellow 04/12/2014 1937   APPEARANCEUR Clear 04/12/2014 1937   LABSPEC 1.010 04/12/2014 1937   PHURINE 5.0 04/12/2014 1937   GLUCOSEU Negative 06/03/2015 1103   GLUCOSEU Negative 04/12/2014 1937   HGBUR 2+ 04/12/2014 1937   BILIRUBINUR Negative 06/03/2015 1103   BILIRUBINUR Negative 04/12/2014 1937   KETONESUR Negative 04/12/2014 1937   PROTEINUR Negative 04/12/2014 1937   NITRITE Negative 06/03/2015 1103   NITRITE Negative 04/12/2014 1937   LEUKOCYTESUR Trace* 06/03/2015 1103   LEUKOCYTESUR 1+ 04/12/2014 1937    Pertinent Imaging:   Assessment & Plan:    1. Urinary urge incontinence- Patient reports increasing urinary urgency with associated intermittent urge incontinence. She reports that she does not feel that Assunta Found is working as well as it used to. She has also been experiencing bothersome side effects of dry mouth. We will stop Vesicare and start Myrbetriq and have patient f/u in 3 weeks for  reassessment.   - Urinalysis, Complete - BLADDER SCAN AMB NON-IMAGING  2. Microscopic hematuria-  Patient reports she was told by her PCP that she had blood in her urine.  UA today showing 0-2 RBCs. Negative hematuria work up 1 year ago. There is no indication for further hematuria evaluation at this time. Patient understands that should she develop gross hematuria repeat imaging and cystoscopy would be indicated.  Return in about 3 weeks (around 06/24/2015).  These notes generated with voice recognition software. I apologize for typographical errors.  Earlie Lou, FNP  [facility] [address] [place], [place] [address] [phone]

## 2015-06-10 DIAGNOSIS — S61219A Laceration without foreign body of unspecified finger without damage to nail, initial encounter: Secondary | ICD-10-CM | POA: Diagnosis not present

## 2015-06-24 ENCOUNTER — Other Ambulatory Visit: Payer: Self-pay | Admitting: Obstetrics and Gynecology

## 2015-06-24 ENCOUNTER — Ambulatory Visit: Payer: Commercial Managed Care - HMO | Admitting: Obstetrics and Gynecology

## 2015-11-19 ENCOUNTER — Ambulatory Visit (INDEPENDENT_AMBULATORY_CARE_PROVIDER_SITE_OTHER): Payer: Medicare HMO | Admitting: Urology

## 2015-11-19 ENCOUNTER — Encounter: Payer: Self-pay | Admitting: Urology

## 2015-11-19 VITALS — BP 106/70 | HR 79 | Ht 63.0 in | Wt 214.8 lb

## 2015-11-19 DIAGNOSIS — N3941 Urge incontinence: Secondary | ICD-10-CM

## 2015-11-19 DIAGNOSIS — R3129 Other microscopic hematuria: Secondary | ICD-10-CM

## 2015-11-19 LAB — URINALYSIS, COMPLETE
BILIRUBIN UA: NEGATIVE
GLUCOSE, UA: NEGATIVE
KETONES UA: NEGATIVE
NITRITE UA: NEGATIVE
Protein, UA: NEGATIVE
SPEC GRAV UA: 1.025 (ref 1.005–1.030)
Urobilinogen, Ur: 0.2 mg/dL (ref 0.2–1.0)
pH, UA: 5 (ref 5.0–7.5)

## 2015-11-19 LAB — MICROSCOPIC EXAMINATION
Epithelial Cells (non renal): >10 /hpf — AB (ref 0–10)
WBC, UA: >30 /hpf — AB (ref 0–?)

## 2015-11-19 LAB — BLADDER SCAN AMB NON-IMAGING: Scan Result: 24

## 2015-11-19 NOTE — Progress Notes (Signed)
2:39 PM   Tracey Chandler 06/02/1966 [id number]  Referring provider: Geoffery Lyons, PA [address] [place], [place] [address]  Chief Complaint  Patient presents with  . Urinary Incontinence    urge urinary incontinence    HPI: Patient is a 49 year old Caucasian female with MS and a history of GU issues including urethral discharge/bleeding and OAB symptoms including mixed urinary incontinence who was given a trial Myrbetriq 25 mg daily who presents today for follow-up.  She had been on Vesicare 5 mg daily in the past with good symptom control, but started to lose its effectiveness and the dry mouth was becoming bothersome to her.  She was then given samples of Myrbetriq 25 mg daily and was instructed to follow-up in 3 weeks.  She could not keep her December appointment.    She states that she did not find the Myrbetriq samples beneficial.  She felt that the samples did not curb her urinary urge incontinence.  She felt the Vesicare provided more benefit.  Negative hematuria workup completed 2 years ago.  See below.  CT Urogram 05/29/14 IMPRESSION: No renal, ureteral, or bladder calculi. No hydronephrosis. 10 mm posterior right upper pole renal cyst. No enhancing renal lesions. Bladder is unremarkable by CT.  Negative cystoscopy 06/05/14  She has ha history of TOT midurethral sling 2009. She also has h/o UDS in 2009 (see records, fairly unremarkable).  Ob/GYN at [facility] clinic (Dr. Saintclair Halsted). She is s/p hysterectomy.   She is a smoker.  She had a negative renal ultrasound in October 2016   PMH: Past Medical History  Diagnosis Date  . Mixed incontinence   . OAB (overactive bladder)   . Hematuria   . Herpes, genital   . Snores   . Obesity   . COPD (chronic obstructive pulmonary disease) (HCC)   . Pulmonary embolism (HCC)   . Fibromyalgia   . MS (multiple sclerosis) (HCC)   . Cervical dysplasia     Surgical History: Past Surgical History  Procedure  Laterality Date  . Abdominal hysterectomy    . Bladder mesh    . Chonca batosa    . Dilation and curettage of uterus    . Laser ablation condyloma cervical / vulvar    . Cholecystectomy      Home Medications:    Medication List       This list is accurate as of: 11/19/15  2:39 PM.  Always use your most recent med list.               [employer] SALINE NASAL SPRAY 0.65 % nasal spray  Generic drug:  sodium chloride  1 spray 2 (two) times daily.     fluticasone 50 MCG/ACT nasal spray  Commonly known as:  FLONASE  Place into the nose.     HYDROcodone-acetaminophen 5-325 MG tablet  Commonly known as:  NORCO/VICODIN  Take 1 tablet by mouth every 8 (eight) hours as needed. for pain     LYRICA 150 MG capsule  Generic drug:  pregabalin  Take 150 mg by mouth 2 (two) times daily.     Magnesium 250 MG Tabs  Take by mouth.     meclizine 12.5 MG tablet  Commonly known as:  ANTIVERT  TAKE 1 TABLET BY MOUTH 3 TIMES A DAY AS NEEDED FOR DIZZINESS     MYRBETRIQ 25 MG Tb24 tablet  Generic drug:  mirabegron ER  Take by mouth.     niacin 100 MG tablet  Take by mouth.  pantoprazole 40 MG tablet  Commonly known as:  PROTONIX     Phendimetrazine Tartrate 105 MG Cp24  Reported on 11/19/2015     phentermine 37.5 MG tablet  Commonly known as:  ADIPEX-P  Reported on 11/19/2015     PROAIR HFA 108 (90 Base) MCG/ACT inhaler  Generic drug:  albuterol  Inhale into the lungs.     RA VITAMIN B-12 TR 1000 MCG Tbcr  Generic drug:  Cyanocobalamin  Take by mouth. Reported on 11/19/2015     sertraline 100 MG tablet  Commonly known as:  ZOLOFT  Take by mouth.     valACYclovir 500 MG tablet  Commonly known as:  VALTREX  Take by mouth.     Vitamin D3 2000 units capsule  Take by mouth.     XARELTO 20 MG Tabs tablet  Generic drug:  rivaroxaban  Take 20 mg by mouth daily.        Allergies:  Allergies  Allergen Reactions  . Cefaclor Other (See Comments)    Other reaction(s): Unknown  .  Penicillins     Other reaction(s): Unknown  . Phenytoin     Other reaction(s): Unknown  . Phenytoin Sodium Extended Other (See Comments)    Family History: Family History  Problem Relation Age of Onset  . Hypertension Mother   . Hypertension Father     Social History:  reports that she has been smoking Cigarettes.  She has been smoking about 0.50 packs per day. She does not have any smokeless tobacco history on file. She reports that she does not drink alcohol or use illicit drugs.  ROS: UROLOGY Frequent Urination?: Yes Hard to postpone urination?: Yes Burning/pain with urination?: No Get up at night to urinate?: Yes Leakage of urine?: No Urine stream starts and stops?: No Trouble starting stream?: No Do you have to strain to urinate?: No Blood in urine?: No Urinary tract infection?: No Sexually transmitted disease?: No Injury to kidneys or bladder?: No Painful intercourse?: No Weak stream?: No Currently pregnant?: No Vaginal bleeding?: No Last menstrual period?: n  Gastrointestinal Nausea?: No Vomiting?: No Indigestion/heartburn?: Yes Diarrhea?: No Constipation?: No  Constitutional Fever: No Night sweats?: No Weight loss?: No Fatigue?: Yes  Skin Skin rash/lesions?: No Itching?: No  Eyes Blurred vision?: No Double vision?: No  Ears/Nose/Throat Sore throat?: No Sinus problems?: Yes  Hematologic/Lymphatic Swollen glands?: No Easy bruising?: Yes  Cardiovascular Leg swelling?: No Chest pain?: No  Respiratory Cough?: Yes Shortness of breath?: No  Endocrine Excessive thirst?: No  Musculoskeletal Back pain?: Yes Joint pain?: Yes  Neurological Headaches?: No Dizziness?: No  Psychologic Depression?: No Anxiety?: Yes  Physical Exam: BP 106/70 mmHg  Pulse 79  Ht 5\' 3"  (1.6 m)  Wt 214 lb 12.8 oz (97.433 kg)  BMI 38.06 kg/m2  Constitutional:  Alert and oriented, No acute distress. HEENT: [place] AT, moist mucus membranes.  Trachea midline,  no masses. Cardiovascular: No clubbing, cyanosis, or edema. Respiratory: Normal respiratory effort, no increased work of breathing. Skin: No rashes, bruises or suspicious lesions. Lymph: No cervical or inguinal adenopathy. Neurologic: Grossly intact, no focal deficits, moving all 4 extremities. Psychiatric: Normal mood and affect.  Laboratory Data:  Urinalysis Results for orders placed or performed in visit on 11/19/15  Microscopic Examination  Result Value Ref Range   WBC, UA >30 (A) 0 -  5 /hpf   RBC, UA 3-10 (A) 0 -  2 /hpf   Epithelial Cells (non renal) >10 (A) 0 - 10 /hpf  Bacteria, UA Few None seen/Few  Urinalysis, Complete  Result Value Ref Range   Specific Gravity, UA 1.025 1.005 - 1.030   pH, UA 5.0 5.0 - 7.5   Color, UA Yellow Yellow   Appearance Ur Cloudy (A) Clear   Leukocytes, UA 2+ (A) Negative   Protein, UA Negative Negative/Trace   Glucose, UA Negative Negative   Ketones, UA Negative Negative   RBC, UA 1+ (A) Negative   Bilirubin, UA Negative Negative   Urobilinogen, Ur 0.2 0.2 - 1.0 mg/dL   Nitrite, UA Negative Negative   Microscopic Examination See below:   Bladder Scan (Post Void Residual) in office  Result Value Ref Range   Scan Result 24     Pertinent Imaging: Results for Tracey Chandler, Tracey Chandler (MRN [id number]) as of 11/23/2015 22:38  Ref. Range 11/19/2015 14:06  Scan Result Unknown 24   CLINICAL DATA: Microscopic hematuria.  EXAM: RENAL / URINARY TRACT ULTRASOUND COMPLETE  COMPARISON: Abdomen and pelvis CT dated 05/29/2014.  FINDINGS: Right Kidney:  Length: 11.8 cm. Echogenicity within normal limits. No mass or hydronephrosis visualized.  Left Kidney:  Length: 11.1 cm. Echogenicity within normal limits. No mass or hydronephrosis visualized.  Bladder:  Appears normal for degree of bladder distention.  IMPRESSION: Normal examination.   Electronically Signed  By: Beckie Salts M.D.  On: 05/16/2015 15:26  CLINICAL DATA:  Gross hematuria since 04/13/2014. Status post  hysterectomy, cholecystectomy, and bladder sling.   EXAM:  CT ABDOMEN AND PELVIS WITHOUT AND WITH CONTRAST   TECHNIQUE:  Multidetector CT imaging of the abdomen and pelvis was performed  following the standard protocol before and following the bolus  administration of intravenous contrast.   CONTRAST: 125 mL Isovue 370 IV   COMPARISON: None.   FINDINGS:  Lower chest: Lung bases are clear.   Hepatobiliary: Mild hepatic steatosis.   Status post cholecystectomy. No intrahepatic or extrahepatic ductal  dilatation.   Pancreas: Within normal limits.   Spleen: Within normal limits.   Adrenals/Urinary Tract: Adrenal glands are unremarkable.   10 mm cyst in the posterior right upper kidney. Left kidney is  within normal limits.   No renal, ureteral, or bladder calculi. No hydronephrosis.   On delayed imaging, there are no filling defects in the bilateral  opacified proximal collecting systems, ureters, or bladder.  Mid/distal left ureter remains unopacified.   Bladder is within normal limits.   Stomach/Bowel: Stomach is unremarkable.   No evidence of bowel obstruction.   Normal appendix.   Vascular/Lymphatic: No evidence of abdominal aortic aneurysm.   No suspicious abdominopelvic lymphadenopathy.   Reproductive: Status post hysterectomy.   Bilateral ovarian cysts, measuring up to 2.7 cm on the left, likely  physiologic.   Other: No abdominopelvic ascites.   Musculoskeletal: Degenerative changes of the visualized  thoracolumbar spine, most prominent at L5-S1.   IMPRESSION:  No renal, ureteral, or bladder calculi. No hydronephrosis.   10 mm posterior right upper pole renal cyst. No enhancing renal  lesions.   Bladder is unremarkable by CT.     Assessment & Plan:    1. Urinary urge incontinence:   I discussed with the patient undergoing  a trial of Myrbetriq 50 mg daily, trying a different anti-cholinergic, PTNS, PT or an appointment with Dr. Sherron Monday.  She would like to try a different anticholinergic at this time as well as having an appointment with Dr. Sherron Monday.  I have given her samples of Toviaz 8 mg daily and she'll be scheduled for an appointment with Dr.  MacDiarmid.    - Urinalysis, Complete - BLADDER SCAN AMB NON-IMAGING  2. Microscopic hematuria:  UA today showing 3-11 RBC's/hpf.  Negative hematuria work up 2 years ago. RUS in 04/2015 was negative.  I did offer her a referral to nephrology, but she has declined at this time.  If microscopic hematuria persists, she may need a repeat cystoscopy.    Return for appt. with Dr. Sherron Monday.  These notes generated with voice recognition software. I apologize for typographical errors.  Michiel Cowboy, PA-C  [facility] [address] [place], [place] [address] [phone]

## 2015-11-23 ENCOUNTER — Encounter: Payer: Self-pay | Admitting: Urology

## 2015-11-23 DIAGNOSIS — R3129 Other microscopic hematuria: Secondary | ICD-10-CM

## 2015-11-23 DIAGNOSIS — N3946 Mixed incontinence: Secondary | ICD-10-CM | POA: Insufficient documentation

## 2015-11-23 HISTORY — DX: Other microscopic hematuria: R31.29

## 2015-12-02 IMAGING — US US RENAL
1 series · 14 of 25 positions shown · non-contrast
Comparison: Abdomen and pelvis CT dated 05/29/2014.

CLINICAL DATA: Microscopic hematuria.

EXAM:
RENAL / URINARY TRACT ULTRASOUND COMPLETE

[Series 1: us renal · 0.25mm/px · 14 of 29 slices shown]
[im 1/29]
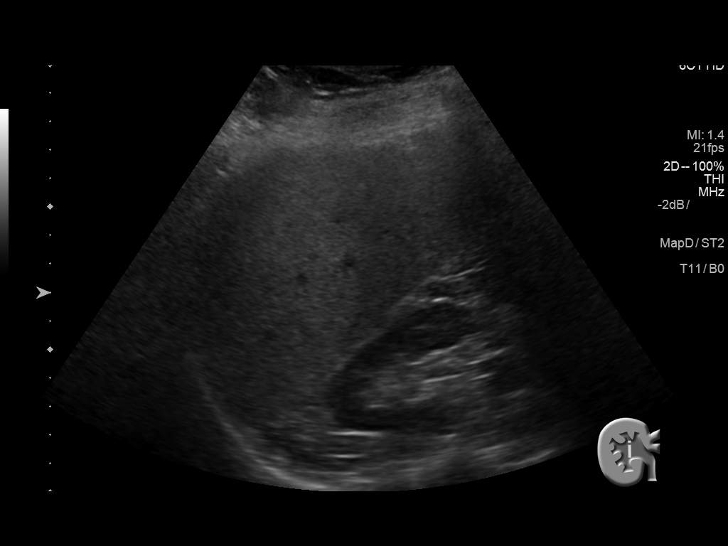
[im 3/29]
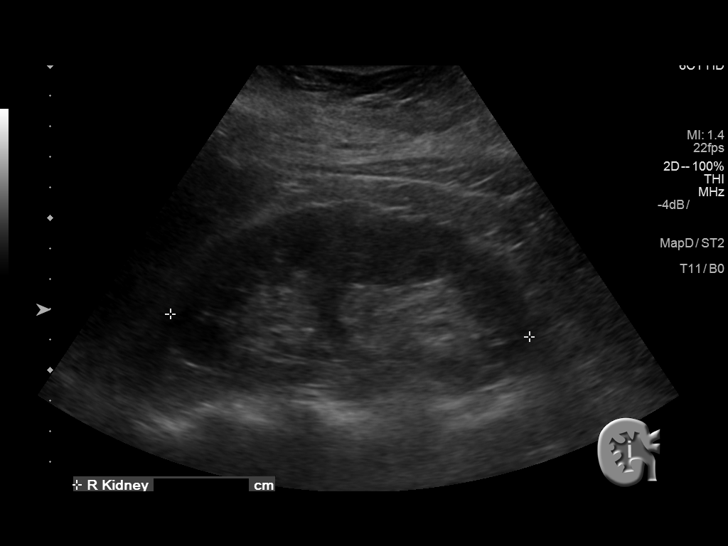
[im 5/29]
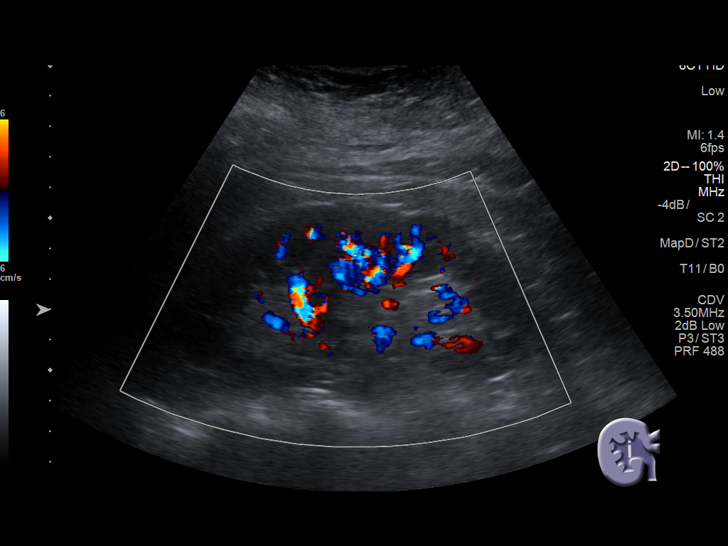
[im 8/29]
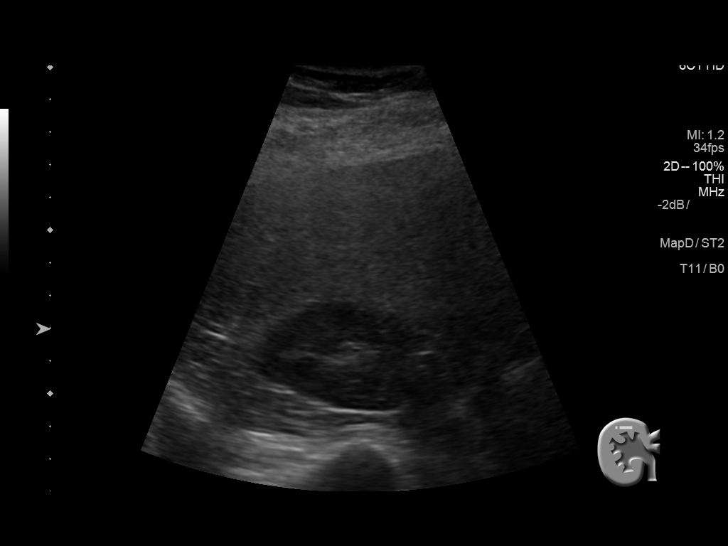
[im 10/29]
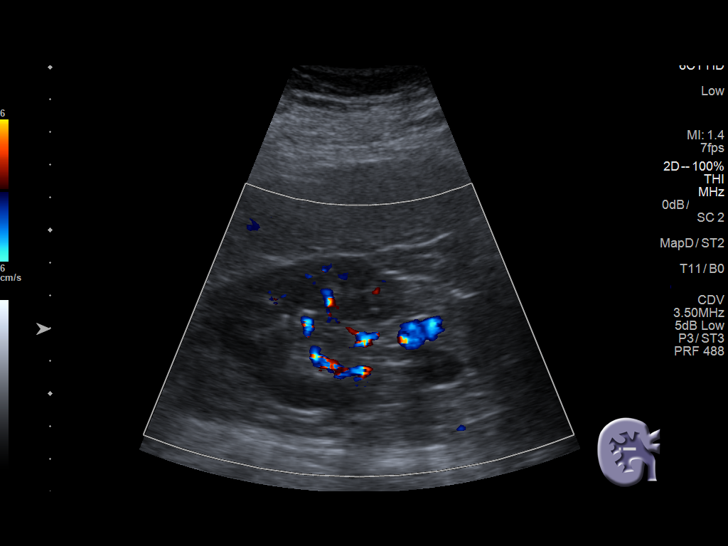
[im 11/29]
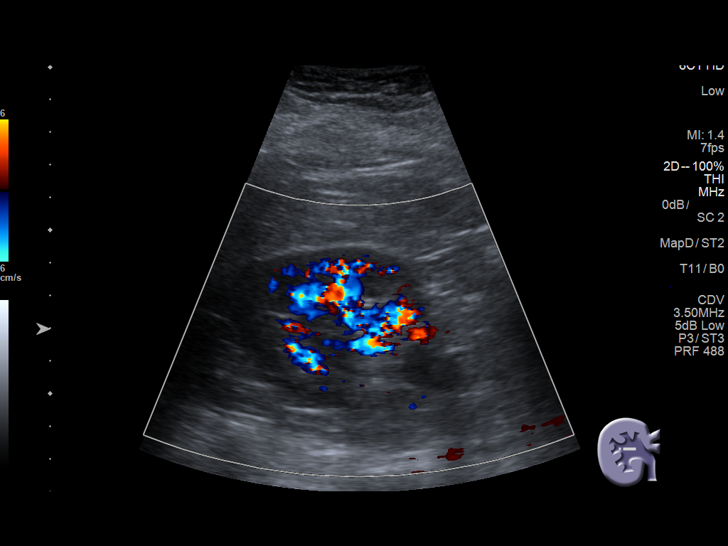
[im 13/29]
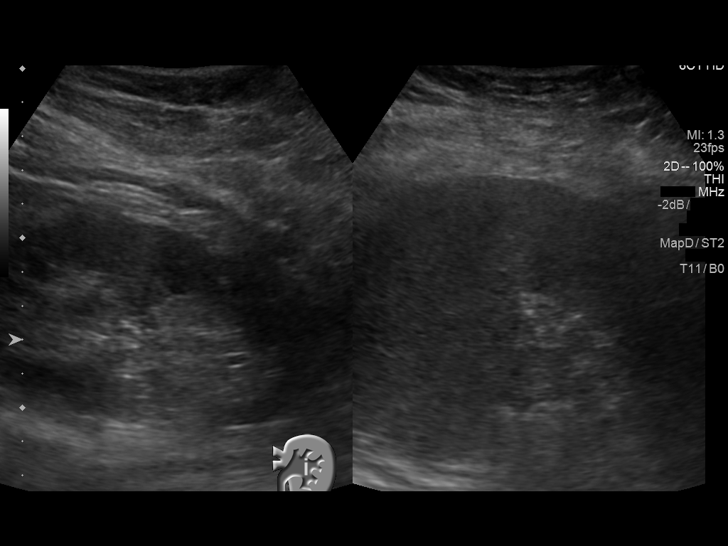
[im 16/29]
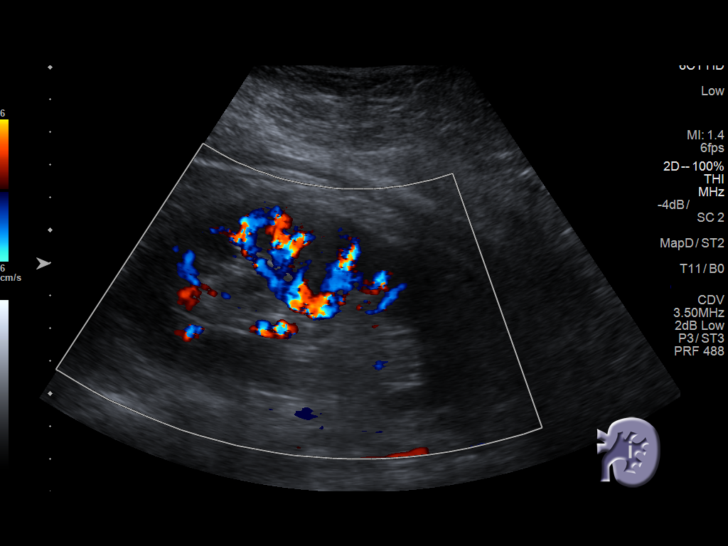
[im 18/29]
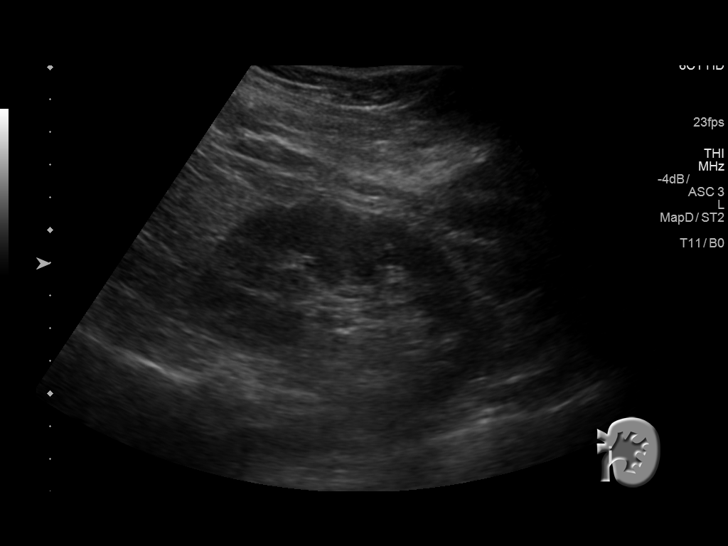
[im 19/29]
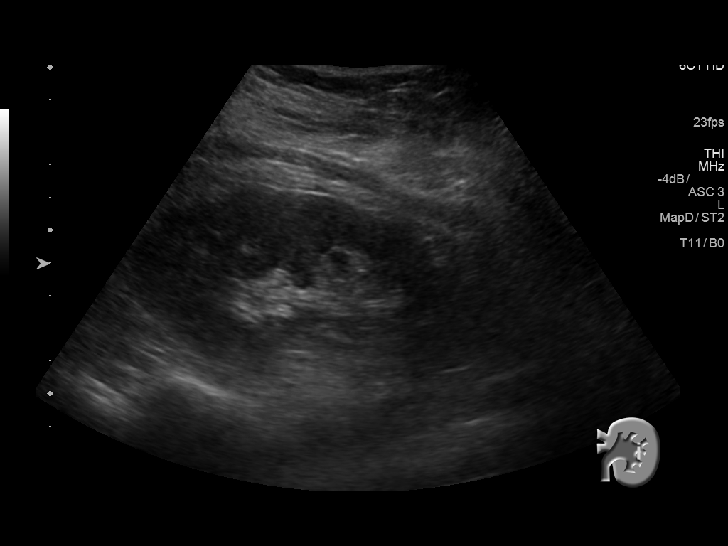
[im 22/29]
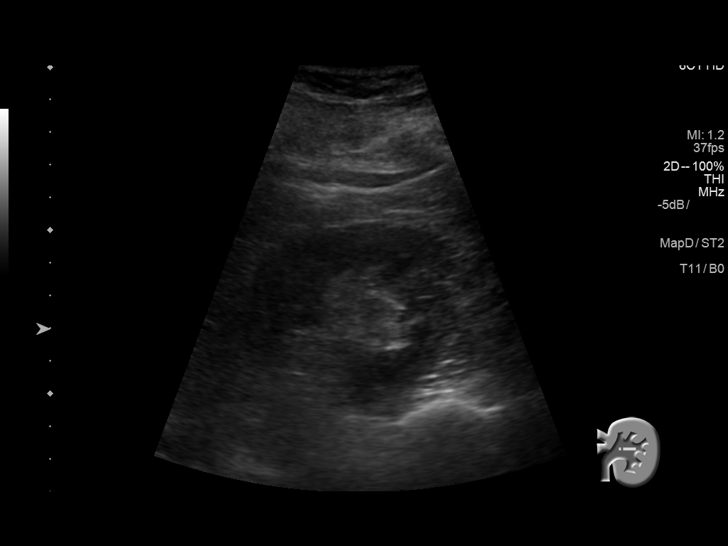
[im 24/29]
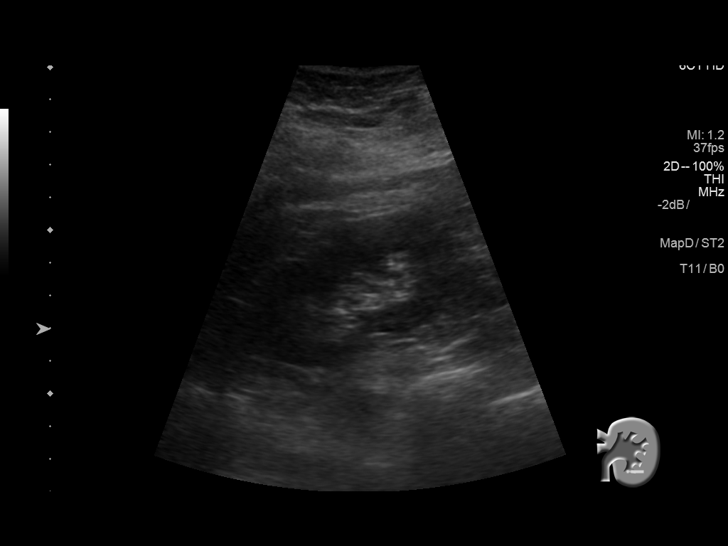
[im 26/29]
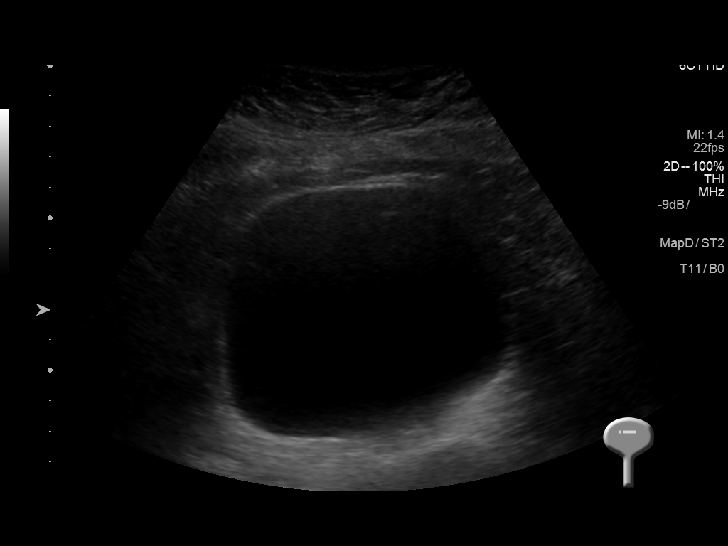
[im 29/29]
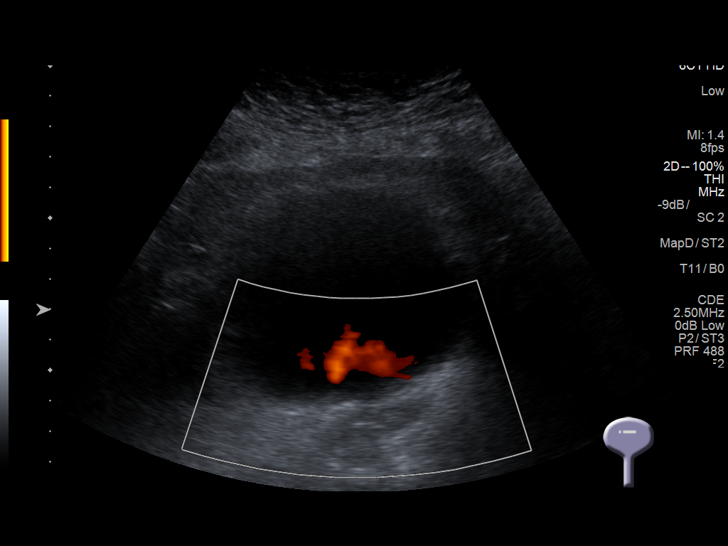

[14 of 25 positions shown; findings below may reference images not displayed]

FINDINGS: Right Kidney:

Length: 11.8 cm. Echogenicity within normal limits. No mass or
hydronephrosis visualized.

Left Kidney:

Length: 11.1 cm. Echogenicity within normal limits. No mass or
hydronephrosis visualized.

Bladder:

Appears normal for degree of bladder distention.
IMPRESSION: Normal examination.

## 2015-12-13 ENCOUNTER — Ambulatory Visit (INDEPENDENT_AMBULATORY_CARE_PROVIDER_SITE_OTHER): Payer: Medicare HMO | Admitting: Urology

## 2015-12-13 ENCOUNTER — Encounter: Payer: Self-pay | Admitting: Urology

## 2015-12-13 VITALS — BP 111/76 | HR 79 | Ht 63.0 in | Wt 218.9 lb

## 2015-12-13 DIAGNOSIS — N3941 Urge incontinence: Secondary | ICD-10-CM

## 2015-12-13 DIAGNOSIS — R3129 Other microscopic hematuria: Secondary | ICD-10-CM | POA: Diagnosis not present

## 2015-12-13 LAB — MICROSCOPIC EXAMINATION: RBC, UA: NONE SEEN /hpf (ref 0–?)

## 2015-12-13 LAB — URINALYSIS, COMPLETE
BILIRUBIN UA: NEGATIVE
GLUCOSE, UA: NEGATIVE
KETONES UA: NEGATIVE
NITRITE UA: NEGATIVE
Protein, UA: NEGATIVE
SPEC GRAV UA: 1.015 (ref 1.005–1.030)
UUROB: 0.2 mg/dL (ref 0.2–1.0)
pH, UA: 5.5 (ref 5.0–7.5)

## 2015-12-13 MED ORDER — FESOTERODINE FUMARATE ER 8 MG PO TB24: 8.0000 mg | ORAL_TABLET | Freq: Every day | ORAL | Status: DC

## 2015-12-13 NOTE — Progress Notes (Signed)
12/13/2015 3:03 PM   Tracey Chandler [birth year] [id number]  Referring provider: Geoffery Lyons, [place] [address] [place], [place] [address]  Chief Complaint  Patient presents with  . Follow-up    urinary urge incontinence, microscopic hematuria    HPI: Tracey Chandler: Patient is a 50 year old Caucasian female with MS and a history of GU issues including urethral discharge/bleeding and OAB symptoms including mixed urinary incontinence who was given a trial Myrbetriq 25 mg daily who presents today for follow-up. She had been on Vesicare 5 mg daily in the past with good symptom control, but started to lose its effectiveness and the dry mouth was becoming bothersome to her. She was then given samples of Myrbetriq 25 mg daily and was instructed to follow-up in 3 weeks. She could not keep her December appointment.  She states that she did not find the Myrbetriq samples beneficial. She felt that the samples did not curb her urinary urge incontinence. She felt the Vesicare provided more benefit.    The patient had a normal CT urogram November 2015. She had a tot mid urethral sling 2009. She just had a normal renal ultrasound. She is here with a trial of Toviaz 8 mg. There was a question with a not she should have a cystoscopy for microscopic hematuria.      PMH: Past Medical History  Diagnosis Date  . Mixed incontinence   . OAB (overactive bladder)   . Hematuria   . Herpes, genital   . Snores   . Obesity   . COPD (chronic obstructive pulmonary disease) (HCC)   . Pulmonary embolism (HCC)   . Fibromyalgia   . MS (multiple sclerosis) (HCC)   . Cervical dysplasia   . Multiple sclerosis (HCC) 06/03/2015  . Microscopic hematuria 11/23/2015  . Discharge from the vagina 11/19/2014  . H/O: obesity 04/06/2014    Surgical History: Past Surgical History  Procedure Laterality Date  . Abdominal hysterectomy    . Bladder mesh    . Chonca batosa    . Dilation and curettage of uterus    . Laser  ablation condyloma cervical / vulvar    . Cholecystectomy      Home Medications:    Medication List       This list is accurate as of: 12/13/15  3:03 PM.  Always use your most recent med list.               [employer] SALINE NASAL SPRAY 0.65 % nasal spray  Generic drug:  sodium chloride  1 spray 2 (two) times daily.     fluticasone 50 MCG/ACT nasal spray  Commonly known as:  FLONASE  Place into the nose.     HYDROcodone-acetaminophen 5-325 MG tablet  Commonly known as:  NORCO/VICODIN  Take 1 tablet by mouth every 8 (eight) hours as needed. for pain     LYRICA 150 MG capsule  Generic drug:  pregabalin  Take 150 mg by mouth 2 (two) times daily.     Magnesium 250 MG Tabs  Take by mouth.     meclizine 12.5 MG tablet  Commonly known as:  ANTIVERT  TAKE 1 TABLET BY MOUTH 3 TIMES A DAY AS NEEDED FOR DIZZINESS     MYRBETRIQ 25 MG Tb24 tablet  Generic drug:  mirabegron ER  Take by mouth.     niacin 100 MG tablet  Take by mouth.     pantoprazole 40 MG tablet  Commonly known as:  PROTONIX  Phendimetrazine Tartrate 105 MG Cp24  Reported on 11/19/2015     phentermine 37.5 MG tablet  Commonly known as:  ADIPEX-P  Reported on 11/19/2015     PROAIR HFA 108 (90 Base) MCG/ACT inhaler  Generic drug:  albuterol  Inhale into the lungs.     RA VITAMIN B-12 TR 1000 MCG Tbcr  Generic drug:  Cyanocobalamin  Take by mouth. Reported on 11/19/2015     sertraline 100 MG tablet  Commonly known as:  ZOLOFT  Take by mouth.     valACYclovir 500 MG tablet  Commonly known as:  VALTREX  Take by mouth.     Vitamin D3 2000 units capsule  Take by mouth.     XARELTO 20 MG Tabs tablet  Generic drug:  rivaroxaban  Take 20 mg by mouth daily.        Allergies:  Allergies  Allergen Reactions  . Cefaclor Other (See Comments)    Other reaction(s): Unknown  . Penicillins     Other reaction(s): Unknown  . Phenytoin     Other reaction(s): Unknown  . Phenytoin Sodium Extended Other (See  Comments)    Family History: Family History  Problem Relation Age of Onset  . Hypertension Mother   . Hypertension Father     Social History:  reports that she has been smoking Cigarettes.  She has been smoking about 0.50 packs per day. She does not have any smokeless tobacco history on file. She reports that she does not drink alcohol or use illicit drugs.  ROS: UROLOGY Frequent Urination?: Yes Hard to postpone urination?: Yes Burning/pain with urination?: No Get up at night to urinate?: Yes Leakage of urine?: Yes Urine stream starts and stops?: Yes Trouble starting stream?: No Do you have to strain to urinate?: No Blood in urine?: No Urinary tract infection?: No Sexually transmitted disease?: No Injury to kidneys or bladder?: No Painful intercourse?: No Weak stream?: No Currently pregnant?: No Vaginal bleeding?: No Last menstrual period?: No  Gastrointestinal Nausea?: No Vomiting?: No Indigestion/heartburn?: Yes Diarrhea?: No Constipation?: No  Constitutional Fever: No Night sweats?: No Weight loss?: No Fatigue?: No  Skin Skin rash/lesions?: No Itching?: No  Eyes Blurred vision?: No Double vision?: No  Ears/Nose/Throat Sore throat?: No Sinus problems?: Yes  Hematologic/Lymphatic Swollen glands?: No Easy bruising?: No  Cardiovascular Leg swelling?: No Chest pain?: No  Respiratory Cough?: No Shortness of breath?: No  Endocrine Excessive thirst?: No  Musculoskeletal Back pain?: No Joint pain?: No  Neurological Headaches?: No Dizziness?: No  Psychologic Depression?: No Anxiety?: Yes  Physical Exam: BP 111/76 mmHg  Pulse 79  Ht 5\' 3"  (1.6 m)  Wt 218 lb 14.4 oz (99.292 kg)  BMI 38.79 kg/m2    Laboratory Data: Lab Results  Component Value Date   WBC 6.6 04/12/2014   HGB 12.5 04/12/2014   HCT 37.0 04/12/2014   MCV 91 04/12/2014   PLT 278 04/12/2014    Lab Results  Component Value Date   CREATININE 0.74 04/12/2014     No results found for: PSA  No results found for: TESTOSTERONE  No results found for: HGBA1C  Urinalysis    Component Value Date/Time   COLORURINE Yellow 04/12/2014 1937   APPEARANCEUR Cloudy* 11/19/2015 1354   APPEARANCEUR Clear 04/12/2014 1937   LABSPEC 1.010 04/12/2014 1937   PHURINE 5.0 04/12/2014 1937   GLUCOSEU Negative 11/19/2015 1354   GLUCOSEU Negative 04/12/2014 1937   HGBUR 2+ 04/12/2014 1937   BILIRUBINUR Negative 11/19/2015 1354   BILIRUBINUR  Negative 04/12/2014 1937   KETONESUR Negative 04/12/2014 1937   PROTEINUR Negative 11/19/2015 1354   PROTEINUR Negative 04/12/2014 1937   NITRITE Negative 11/19/2015 1354   NITRITE Negative 04/12/2014 1937   LEUKOCYTESUR 2+* 11/19/2015 1354   LEUKOCYTESUR 1+ 04/12/2014 1937    Pertinent Imaging: NONE  Assessment & Plan:  The patient is much better and now dry on Toviaz 8 mg. She has much less urgency and frequency. Reassess in 4 months. Samples and prescription given. She no blood in the urine today.  1. Microscopic hematuria  - Urinalysis, Complete  2. Urge incontinence of urine  - Urinalysis, Complete   No Follow-up on file.  Martina Sinner, MD  [facility] [address] [place], [place] [address] [phone]

## 2015-12-27 ENCOUNTER — Other Ambulatory Visit: Payer: Self-pay

## 2015-12-27 DIAGNOSIS — N3941 Urge incontinence: Secondary | ICD-10-CM

## 2015-12-27 DIAGNOSIS — R3129 Other microscopic hematuria: Secondary | ICD-10-CM

## 2015-12-27 MED ORDER — FESOTERODINE FUMARATE ER 8 MG PO TB24: 8.0000 mg | ORAL_TABLET | Freq: Every day | ORAL | Status: DC

## 2016-01-13 ENCOUNTER — Encounter: Payer: Self-pay | Admitting: Emergency Medicine

## 2016-01-13 ENCOUNTER — Emergency Department
Admission: EM | Admit: 2016-01-13 | Discharge: 2016-01-13 | Disposition: A | Discharge status: Home / Self Care | Payer: Commercial Managed Care - HMO | Attending: Emergency Medicine | Admitting: Emergency Medicine

## 2016-01-13 ENCOUNTER — Emergency Department: Payer: Commercial Managed Care - HMO

## 2016-01-13 DIAGNOSIS — M79604 Pain in right leg: Secondary | ICD-10-CM | POA: Insufficient documentation

## 2016-01-13 DIAGNOSIS — Z79899 Other long term (current) drug therapy: Secondary | ICD-10-CM | POA: Diagnosis not present

## 2016-01-13 DIAGNOSIS — J449 Chronic obstructive pulmonary disease, unspecified: Secondary | ICD-10-CM | POA: Insufficient documentation

## 2016-01-13 DIAGNOSIS — Z7951 Long term (current) use of inhaled steroids: Secondary | ICD-10-CM | POA: Diagnosis not present

## 2016-01-13 DIAGNOSIS — F17210 Nicotine dependence, cigarettes, uncomplicated: Secondary | ICD-10-CM | POA: Diagnosis not present

## 2016-01-13 DIAGNOSIS — Z86718 Personal history of other venous thrombosis and embolism: Secondary | ICD-10-CM | POA: Insufficient documentation

## 2016-01-13 DIAGNOSIS — M79661 Pain in right lower leg: Secondary | ICD-10-CM | POA: Diagnosis not present

## 2016-01-13 NOTE — ED Provider Notes (Signed)
[facility] Emergency Department Provider Note   ____________________________________________  Time seen: ~1830  I have reviewed the triage vital signs and the nursing notes.   HISTORY  Chief Complaint Leg Pain   History limited by: Not Limited   HPI Tracey Chandler is a 50 y.o. female with history of clotting disorder, who presents to the emergency department today because of concerns for right leg pain. The pain is been going on for the past 3 days. Slit located primarily behind her knee and then radiates down towards her ankle. She has not noticed any significant swelling of her leg however states she thinks her ankle might be a little more swollen. No redness. The patient denies any fevers. No shortness of breath or chest pain. She has been taking her Xarelto.   Past Medical History  Diagnosis Date  . Mixed incontinence   . OAB (overactive bladder)   . Hematuria   . Herpes, genital   . Snores   . Obesity   . COPD (chronic obstructive pulmonary disease) (HCC)   . Pulmonary embolism (HCC)   . Fibromyalgia   . MS (multiple sclerosis) (HCC)   . Cervical dysplasia   . Multiple sclerosis (HCC) 06/03/2015  . Microscopic hematuria 11/23/2015  . Discharge from the vagina 11/19/2014  . H/O: obesity 04/06/2014    Patient Active Problem List   Diagnosis Date Noted  . Urge urinary incontinence 11/23/2015  . Microscopic hematuria 11/23/2015  . Abnormal cells of cervix 06/03/2015  . Fibromyalgia 06/03/2015  . Multiple sclerosis (HCC) 06/03/2015  . Discharge from the vagina 11/19/2014  . Pulmonary embolism (HCC) 04/06/2014  . Mild chronic obstructive pulmonary disease (HCC) 04/06/2014  . Snores 04/06/2014  . H/O: obesity 04/06/2014    Past Surgical History  Procedure Laterality Date  . Abdominal hysterectomy    . Bladder mesh    . Chonca batosa    . Dilation and curettage of uterus    . Laser ablation condyloma cervical / vulvar    . Cholecystectomy       Current Outpatient Rx  Name  Route  Sig  Dispense  Refill  . EXPIRED: albuterol (PROAIR HFA) 108 (90 BASE) MCG/ACT inhaler   Inhalation   Inhale into the lungs.         . Cholecalciferol (VITAMIN D3) 2000 UNITS capsule   Oral   Take by mouth.         . [facility]S SALINE NASAL SPRAY 0.65 % nasal spray      1 spray 2 (two) times daily.      0     Dispense as written.   . Cyanocobalamin (RA VITAMIN B-12 TR) 1000 MCG TBCR   Oral   Take by mouth. Reported on 11/19/2015         . fesoterodine (TOVIAZ) 8 MG TB24 tablet   Oral   Take 1 tablet (8 mg total) by mouth daily.   30 tablet   11   . fluticasone (FLONASE) 50 MCG/ACT nasal spray   Nasal   Place into the nose.         Marland Kitchen HYDROcodone-acetaminophen (NORCO/VICODIN) 5-325 MG tablet   Oral   Take 1 tablet by mouth every 8 (eight) hours as needed. for pain      0   . LYRICA 150 MG capsule   Oral   Take 150 mg by mouth 2 (two) times daily.      2     Dispense as written.   Marland Kitchen  Magnesium 250 MG TABS   Oral   Take by mouth.         . meclizine (ANTIVERT) 12.5 MG tablet      TAKE 1 TABLET BY MOUTH 3 TIMES A DAY AS NEEDED FOR DIZZINESS      0   . mirabegron ER (MYRBETRIQ) 25 MG TB24 tablet   Oral   Take by mouth.         . niacin 100 MG tablet   Oral   Take by mouth.         . pantoprazole (PROTONIX) 40 MG tablet               . Phendimetrazine Tartrate 105 MG CP24      Reported on 11/19/2015      0   . phentermine (ADIPEX-P) 37.5 MG tablet      Reported on 11/19/2015      0   . sertraline (ZOLOFT) 100 MG tablet   Oral   Take by mouth.         . valACYclovir (VALTREX) 500 MG tablet   Oral   Take by mouth.         Carlena Hurl 20 MG TABS tablet   Oral   Take 20 mg by mouth daily.      5     Dispense as written.     Allergies Cefaclor; Penicillins; Phenytoin; and Phenytoin sodium extended  Family History  Problem Relation Age of Onset  . Hypertension Mother   .  Hypertension Father     Social History Social History  Substance Use Topics  . Smoking status: Current Every Day Smoker -- 0.50 packs/day    Types: Cigarettes  . Smokeless tobacco: None  . Alcohol Use: No    Review of Systems  Constitutional: Negative for fever. Cardiovascular: Negative for chest pain. Respiratory: Negative for shortness of breath. Gastrointestinal: Negative for abdominal pain, vomiting and diarrhea. Genitourinary: Negative for dysuria. Musculoskeletal: Positive for right leg pain Skin: Negative for rash. Neurological: Negative for headaches, focal weakness or numbness.  10-point ROS otherwise negative.  ____________________________________________   PHYSICAL EXAM:  VITAL SIGNS: ED Triage Vitals  Enc Vitals Group     BP 01/13/16 1625 136/80 mmHg     Pulse Rate 01/13/16 1625 92     Resp 01/13/16 1625 20     Temp 01/13/16 1625 98.2 F (36.8 C)     Temp Source 01/13/16 1625 Oral     SpO2 01/13/16 1625 96 %     Weight 01/13/16 1625 210 lb (95.255 kg)     Height 01/13/16 1625 5\' 3"  (1.6 m)     Head Cir --      Peak Flow --      Pain Score 01/13/16 1628 7   Constitutional: Alert and oriented. Well appearing and in no distress. Eyes: Conjunctivae are normal. PERRL. Normal extraocular movements. ENT   Head: Normocephalic and atraumatic.   Nose: No congestion/rhinnorhea.   Mouth/Throat: Mucous membranes are moist.   Neck: No stridor. Hematological/Lymphatic/Immunilogical: No cervical lymphadenopathy. Cardiovascular: Normal rate, regular rhythm.  No murmurs, rubs, or gallops. Respiratory: Normal respiratory effort without tachypnea nor retractions. Breath sounds are clear and equal bilaterally. No wheezes/rales/rhonchi. Gastrointestinal: Soft and nontender. No distention. There is no CVA tenderness. Genitourinary: Deferred Musculoskeletal: Normal range of motion in all extremities. No joint effusions.  No lower extremity tenderness nor  edema. Right leg without any appreciable swelling. No warmth. No tenderness to the calf  or behind the knee. Dorsalis pedis 2+. Neurologic:  Normal speech and language. No gross focal neurologic deficits are appreciated.  Skin:  Skin is warm, dry and intact. No rash noted. Psychiatric: Mood and affect are normal. Speech and behavior are normal. Patient exhibits appropriate insight and judgment.  ____________________________________________    LABS (pertinent positives/negatives)  None  ____________________________________________   EKG  None  ____________________________________________    RADIOLOGY  [place] right lower extremity IMPRESSION: No evidence of right lower extremity deep vein thrombosis.  ____________________________________________   PROCEDURES  Procedure(s) performed: None  Critical Care performed: No  ____________________________________________   INITIAL IMPRESSION / ASSESSMENT AND PLAN / ED COURSE  Pertinent labs & imaging results that were available during my care of the patient were reviewed by me and considered in my medical decision making (see chart for details).  Patient with history of blood clots presents to the emergency department today because of concerns for right leg pain. On exam no swelling, redness, warmth or tenderness. Ultrasound did not show any blood clots. This point I doubt blood clot given negative ultrasound and benign physical exam. Will give patient information on Rice. Did discuss return precautions with the patient.  ____________________________________________   FINAL CLINICAL IMPRESSION(S) / ED DIAGNOSES  Final diagnoses:  Right leg pain     Note: This dictation was prepared with Dragon dictation. Any transcriptional errors that result from this process are unintentional    Phineas Semen, MD 01/13/16 1844

## 2016-01-13 NOTE — Discharge Instructions (Signed)
Apply a compressive ACE bandage. Rest and elevate the affected painful area.  Apply cold compresses intermittently as needed.  As pain recedes, begin normal activities slowly as tolerated.  Please seek medical attention for any high fevers, chest pain, shortness of breath, change in behavior, persistent vomiting, bloody stool or any other new or concerning symptoms.  Elastic Bandage and RICE WHAT DOES AN ELASTIC BANDAGE DO? Elastic bandages come in different shapes and sizes. They generally provide support to your injury and reduce swelling while you are healing, but they can perform different functions. Your health care provider will help you to decide what is best for your protection, recovery, or rehabilitation following an injury. WHAT ARE SOME GENERAL TIPS FOR USING AN ELASTIC BANDAGE?  Use the bandage as directed by the maker of the bandage that you are using.  Do not wrap the bandage too tightly. This may cut off the circulation in the arm or leg in the area below the bandage.  If part of your body beyond the bandage becomes blue, numb, cold, swollen, or is more painful, your bandage is most likely too tight. If this occurs, remove your bandage and reapply it more loosely.  See your health care provider if the bandage seems to be making your problems worse rather than better.  An elastic bandage should be removed and reapplied every 3-4 hours or as directed by your health care provider. WHAT IS RICE? The routine care of many injuries includes rest, ice, compression, and elevation (RICE therapy).  Rest Rest is required to allow your body to heal. Generally, you can resume your routine activities when you are comfortable and have been given permission by your health care provider. Ice Icing your injury helps to keep the swelling down and it reduces pain. Do not apply ice directly to your skin.  Put ice in a plastic bag.  Place a towel between your skin and the bag.  Leave the ice on  for 20 minutes, 2-3 times per day. Do this for as long as you are directed by your health care provider. Compression Compression helps to keep swelling down, gives support, and helps with discomfort. Compression may be done with an elastic bandage. Elevation Elevation helps to reduce swelling and it decreases pain. If possible, your injured area should be placed at or above the level of your heart or the center of your chest. WHEN SHOULD I SEEK MEDICAL CARE? You should seek medical care if:  You have persistent pain and swelling.  Your symptoms are getting worse rather than improving. These symptoms may indicate that further evaluation or further X-rays are needed. Sometimes, X-rays may not show a small broken bone (fracture) until a number of days later. Make a follow-up appointment with your health care provider. Ask when your X-ray results will be ready. Make sure that you get your X-ray results. WHEN SHOULD I SEEK IMMEDIATE MEDICAL CARE? You should seek immediate medical care if:  You have a sudden onset of severe pain at or below the area of your injury.  You develop redness or increased swelling around your injury.  You have tingling or numbness at or below the area of your injury that does not improve after you remove the elastic bandage.   This information is not intended to replace advice given to you by your health care provider. Make sure you discuss any questions you have with your health care provider.   Document Released: 12/26/2001 Document Revised: 03/27/2015 Document Reviewed: 02/19/2014 Elsevier  Interactive Patient Education [employer].

## 2016-01-13 NOTE — ED Notes (Addendum)
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 Pt takes  of xarelto daily.

## 2016-01-29 DIAGNOSIS — R635 Abnormal weight gain: Secondary | ICD-10-CM | POA: Diagnosis not present

## 2016-01-29 DIAGNOSIS — M170 Bilateral primary osteoarthritis of knee: Secondary | ICD-10-CM | POA: Diagnosis not present

## 2016-01-29 DIAGNOSIS — Z6838 Body mass index (BMI) 38.0-38.9, adult: Secondary | ICD-10-CM | POA: Diagnosis not present

## 2016-01-29 DIAGNOSIS — G35 Multiple sclerosis: Secondary | ICD-10-CM | POA: Diagnosis not present

## 2016-01-31 ENCOUNTER — Other Ambulatory Visit: Payer: Self-pay | Admitting: Physician Assistant

## 2016-01-31 ENCOUNTER — Ambulatory Visit
Admission: RE | Admit: 2016-01-31 | Discharge: 2016-01-31 | Disposition: A | Discharge status: Home / Self Care | Payer: Commercial Managed Care - HMO | Source: Ambulatory Visit | Attending: Physician Assistant | Admitting: Physician Assistant

## 2016-01-31 DIAGNOSIS — M170 Bilateral primary osteoarthritis of knee: Secondary | ICD-10-CM | POA: Insufficient documentation

## 2016-01-31 DIAGNOSIS — M25562 Pain in left knee: Principal | ICD-10-CM

## 2016-01-31 DIAGNOSIS — M25761 Osteophyte, right knee: Secondary | ICD-10-CM | POA: Diagnosis not present

## 2016-01-31 DIAGNOSIS — M25561 Pain in right knee: Secondary | ICD-10-CM | POA: Insufficient documentation

## 2016-01-31 DIAGNOSIS — M179 Osteoarthritis of knee, unspecified: Secondary | ICD-10-CM | POA: Diagnosis not present

## 2016-02-03 DIAGNOSIS — Z1231 Encounter for screening mammogram for malignant neoplasm of breast: Secondary | ICD-10-CM | POA: Diagnosis not present

## 2016-02-10 DIAGNOSIS — M1711 Unilateral primary osteoarthritis, right knee: Secondary | ICD-10-CM | POA: Diagnosis not present

## 2016-02-28 DIAGNOSIS — H5210 Myopia, unspecified eye: Secondary | ICD-10-CM | POA: Diagnosis not present

## 2016-02-28 DIAGNOSIS — H5200 Hypermetropia, unspecified eye: Secondary | ICD-10-CM | POA: Diagnosis not present

## 2016-03-03 DIAGNOSIS — Z0100 Encounter for examination of eyes and vision without abnormal findings: Secondary | ICD-10-CM | POA: Diagnosis not present

## 2016-04-02 DIAGNOSIS — R875 Abnormal microbiological findings in specimens from female genital organs: Secondary | ICD-10-CM | POA: Diagnosis not present

## 2016-04-02 DIAGNOSIS — Z01419 Encounter for gynecological examination (general) (routine) without abnormal findings: Secondary | ICD-10-CM | POA: Diagnosis not present

## 2016-04-02 DIAGNOSIS — Z1151 Encounter for screening for human papillomavirus (HPV): Secondary | ICD-10-CM | POA: Diagnosis not present

## 2016-04-02 DIAGNOSIS — Z1211 Encounter for screening for malignant neoplasm of colon: Secondary | ICD-10-CM | POA: Diagnosis not present

## 2016-04-02 DIAGNOSIS — R87820 Cervical low risk human papillomavirus (HPV) DNA test positive: Secondary | ICD-10-CM | POA: Diagnosis not present

## 2016-04-02 DIAGNOSIS — N760 Acute vaginitis: Secondary | ICD-10-CM | POA: Diagnosis not present

## 2016-04-02 DIAGNOSIS — Z124 Encounter for screening for malignant neoplasm of cervix: Secondary | ICD-10-CM | POA: Diagnosis not present

## 2016-04-02 DIAGNOSIS — Z1389 Encounter for screening for other disorder: Secondary | ICD-10-CM | POA: Diagnosis not present

## 2016-04-13 ENCOUNTER — Ambulatory Visit: Payer: Medicare HMO

## 2016-05-14 DIAGNOSIS — R69 Illness, unspecified: Secondary | ICD-10-CM | POA: Diagnosis not present

## 2016-05-18 DIAGNOSIS — Z23 Encounter for immunization: Secondary | ICD-10-CM | POA: Diagnosis not present

## 2016-05-18 DIAGNOSIS — G35 Multiple sclerosis: Secondary | ICD-10-CM | POA: Diagnosis not present

## 2016-05-18 DIAGNOSIS — Z86711 Personal history of pulmonary embolism: Secondary | ICD-10-CM | POA: Diagnosis not present

## 2016-05-18 DIAGNOSIS — Z0001 Encounter for general adult medical examination with abnormal findings: Secondary | ICD-10-CM | POA: Diagnosis not present

## 2016-05-18 DIAGNOSIS — H9193 Unspecified hearing loss, bilateral: Secondary | ICD-10-CM | POA: Diagnosis not present

## 2016-05-18 DIAGNOSIS — M25512 Pain in left shoulder: Secondary | ICD-10-CM | POA: Diagnosis not present

## 2016-06-01 ENCOUNTER — Ambulatory Visit: Payer: Medicare HMO | Admitting: Urology

## 2016-06-09 DIAGNOSIS — J0140 Acute pansinusitis, unspecified: Secondary | ICD-10-CM | POA: Diagnosis not present

## 2016-06-09 DIAGNOSIS — F17210 Nicotine dependence, cigarettes, uncomplicated: Secondary | ICD-10-CM | POA: Diagnosis not present

## 2016-06-09 DIAGNOSIS — H6090 Unspecified otitis externa, unspecified ear: Secondary | ICD-10-CM | POA: Diagnosis not present

## 2016-06-09 DIAGNOSIS — N390 Urinary tract infection, site not specified: Secondary | ICD-10-CM | POA: Diagnosis not present

## 2016-06-30 DIAGNOSIS — N912 Amenorrhea, unspecified: Secondary | ICD-10-CM | POA: Diagnosis not present

## 2016-07-21 ENCOUNTER — Ambulatory Visit (INDEPENDENT_AMBULATORY_CARE_PROVIDER_SITE_OTHER): Payer: Medicare HMO | Admitting: Urology

## 2016-07-21 ENCOUNTER — Emergency Department: Payer: Medicare HMO

## 2016-07-21 ENCOUNTER — Emergency Department
Admission: EM | Admit: 2016-07-21 | Discharge: 2016-07-21 | Disposition: A | Discharge status: Home / Self Care | Payer: Medicare HMO | Attending: Emergency Medicine | Admitting: Emergency Medicine

## 2016-07-21 ENCOUNTER — Encounter: Payer: Self-pay | Admitting: *Deleted

## 2016-07-21 VITALS — BP 116/82 | HR 85 | Ht 63.0 in | Wt 224.8 lb

## 2016-07-21 DIAGNOSIS — Z791 Long term (current) use of non-steroidal anti-inflammatories (NSAID): Secondary | ICD-10-CM | POA: Insufficient documentation

## 2016-07-21 DIAGNOSIS — Z79899 Other long term (current) drug therapy: Secondary | ICD-10-CM | POA: Diagnosis not present

## 2016-07-21 DIAGNOSIS — F17210 Nicotine dependence, cigarettes, uncomplicated: Secondary | ICD-10-CM | POA: Insufficient documentation

## 2016-07-21 DIAGNOSIS — M79605 Pain in left leg: Secondary | ICD-10-CM

## 2016-07-21 DIAGNOSIS — R3129 Other microscopic hematuria: Secondary | ICD-10-CM | POA: Diagnosis not present

## 2016-07-21 DIAGNOSIS — N3941 Urge incontinence: Secondary | ICD-10-CM

## 2016-07-21 DIAGNOSIS — J449 Chronic obstructive pulmonary disease, unspecified: Secondary | ICD-10-CM | POA: Diagnosis not present

## 2016-07-21 MED ORDER — FESOTERODINE FUMARATE ER 8 MG PO TB24: 8.0000 mg | ORAL_TABLET | Freq: Every day | ORAL | 3 refills | Status: DC

## 2016-07-21 NOTE — ED Notes (Signed)
Pt transported to US via stretcher.  

## 2016-07-21 NOTE — ED Triage Notes (Signed)
Pt reports pain in left leg behind the knee.  Pt reports taking xeralto for hx of dvt and pe.  No known injury to leg.  Pt states pain for 3-4 days.  Pt alert.

## 2016-07-21 NOTE — ED Notes (Signed)
Dr. Goodman at bedside.  

## 2016-07-21 NOTE — Discharge Instructions (Signed)
Please seek medical attention for any high fevers, chest pain, shortness of breath, change in behavior, persistent vomiting, bloody stool or any other new or concerning symptoms.  

## 2016-07-21 NOTE — Progress Notes (Signed)
07/21/2016 2:16 PM   Tracey Chandler 08/05/1965 [id number]  Referring provider: Geoffery Lyons, PA [address] [place], [place] [address]  Chief Complaint  Patient presents with  . Follow-up    urinary incontinence     HPI: Tracey Chandler: Patient is a 51 year old Caucasian female with MS and a history of GU issues including urethral discharge/bleeding and OAB symptoms including mixed urinary incontinence who was given a trial Myrbetriq 25 mg daily who presents today for follow-up. She had been on Vesicare 5 mg daily in Tracey past with good symptom control, but started to lose its effectiveness and Tracey dry mouth was becoming bothersome to her.  She states that she did not find Tracey Myrbetriq samples beneficial. She felt that Tracey samples did not curb her urinary urge incontinence. She felt Tracey Vesicare provided more benefit.    Today When I saw Tracey patient in May she was much better on Toviaz 8 mg and had no microscopic hematuria  Tracey Chandler is working but she still can leak when she goes from a sitting to standing position. Her frequency is stable. She does not wear pads but sometimes has to change her underclothes  She understands that we've likely maximize medical therapy and I talked her about percutaneous tibial nerve stimulation with my usual template. She would like to try this therapy in combination with Toviaz. If she does beautifully Tracey Chandler could be stopped  I will order a percutaneous tibial nerve stimulation and reassess her in 3 months   PMH: Past Medical History:  Diagnosis Date  . Cervical dysplasia   . COPD (chronic obstructive pulmonary disease) (HCC)   . Discharge from Tracey vagina 11/19/2014  . Fibromyalgia   . H/O: obesity 04/06/2014  . Hematuria   . Herpes, genital   . Microscopic hematuria 11/23/2015  . Mixed incontinence   . MS (multiple sclerosis) (HCC)   . Multiple sclerosis (HCC) 06/03/2015  . OAB (overactive bladder)   . Obesity   . Pulmonary embolism (HCC)    . Snores     Surgical History: Past Surgical History:  Procedure Laterality Date  . ABDOMINAL HYSTERECTOMY    . Bladder mesh    . CHOLECYSTECTOMY    . Chonca batosa    . DILATION AND CURETTAGE OF UTERUS    . LASER ABLATION CONDYLOMA CERVICAL / VULVAR      Home Medications:  Allergies as of 07/21/2016      Reactions   Cefaclor Other (See Comments)   Other reaction(s): Unknown   Penicillins    Other reaction(s): Unknown   Phenytoin    Other reaction(s): Unknown   Phenytoin Sodium Extended Other (See Comments)      Medication List       Accurate as of 07/21/16  2:16 PM. Always use your most recent med list.          CVS SALINE NASAL SPRAY 0.65 % nasal spray Generic drug:  sodium chloride 1 spray 2 (two) times daily.   fesoterodine 8 MG Tb24 tablet Commonly known as:  TOVIAZ Take 1 tablet (8 mg total) by mouth daily.   fluticasone 50 MCG/ACT nasal spray Commonly known as:  FLONASE Place into Tracey nose.   HYDROcodone-acetaminophen 5-325 MG tablet Commonly known as:  NORCO/VICODIN Take 1 tablet by mouth every 8 (eight) hours as needed. for pain   LYRICA 150 MG capsule Generic drug:  pregabalin Take 150 mg by mouth 2 (two) times daily.   Magnesium 250 MG Tabs Take  by mouth.   meclizine 12.5 MG tablet Commonly known as:  ANTIVERT TAKE 1 TABLET BY MOUTH 3 TIMES A DAY AS NEEDED FOR DIZZINESS   niacin 100 MG tablet Take by mouth.   pantoprazole 40 MG tablet Commonly known as:  PROTONIX   Phendimetrazine Tartrate 105 MG Cp24 Reported on 11/19/2015   phentermine 37.5 MG tablet Commonly known as:  ADIPEX-P Reported on 11/19/2015   PROAIR HFA 108 (90 Base) MCG/ACT inhaler Generic drug:  albuterol Inhale into Tracey lungs.   RA VITAMIN B-12 TR 1000 MCG Tbcr Generic drug:  Cyanocobalamin Take by mouth. Reported on 11/19/2015   sertraline 100 MG tablet Commonly known as:  ZOLOFT Take by mouth.   valACYclovir 500 MG tablet Commonly known as:  VALTREX Take by  mouth.   Vitamin D3 2000 units capsule Take by mouth.   XARELTO 20 MG Tabs tablet Generic drug:  rivaroxaban Take 20 mg by mouth daily.       Allergies:  Allergies  Allergen Reactions  . Cefaclor Other (See Comments)    Other reaction(s): Unknown  . Penicillins     Other reaction(s): Unknown  . Phenytoin     Other reaction(s): Unknown  . Phenytoin Sodium Extended Other (See Comments)    Family History: Family History  Problem Relation Age of Onset  . Hypertension Mother   . Hypertension Father     Social History:  reports that she has been smoking Cigarettes.  She has been smoking about 0.50 packs per day. She does not have any smokeless tobacco history on file. She reports that she does not drink alcohol or use drugs.  ROS:                                        Physical Exam:   Laboratory Data: Lab Results  Component Value Date   WBC 6.6 04/12/2014   HGB 12.5 04/12/2014   HCT 37.0 04/12/2014   MCV 91 04/12/2014   PLT 278 04/12/2014    Lab Results  Component Value Date   CREATININE 0.74 04/12/2014    No results found for: PSA  No results found for: TESTOSTERONE  No results found for: HGBA1C  Urinalysis    Component Value Date/Time   COLORURINE Yellow 04/12/2014 1937   APPEARANCEUR Cloudy (A) 12/13/2015 1453   LABSPEC 1.010 04/12/2014 1937   PHURINE 5.0 04/12/2014 1937   GLUCOSEU Negative 12/13/2015 1453   GLUCOSEU Negative 04/12/2014 1937   HGBUR 2+ 04/12/2014 1937   BILIRUBINUR Negative 12/13/2015 1453   BILIRUBINUR Negative 04/12/2014 1937   KETONESUR Negative 04/12/2014 1937   PROTEINUR Negative 12/13/2015 1453   PROTEINUR Negative 04/12/2014 1937   NITRITE Negative 12/13/2015 1453   NITRITE Negative 04/12/2014 1937   LEUKOCYTESUR 2+ (A) 12/13/2015 1453   LEUKOCYTESUR 1+ 04/12/2014 1937    Pertinent Imaging: none  Assessment & Plan:  See above; Gala Chandler renewed and PTNS ordered  There are no diagnoses  linked to this encounter.  Return in about 3 months (around 10/19/2016).  Martina Sinner, MD  [facility] [address] [place], [place] [address] [phone]

## 2016-07-21 NOTE — ED Provider Notes (Signed)
[facility], [place] Emergency Department Provider Note  ____________________________________________   I have reviewed the triage vital signs and the nursing notes.   HISTORY  Chief Complaint Leg Pain   History limited by: Not Limited   HPI Tracey Chandler is a 51 y.o. female who presents to the emergency department today because of leg pain. It is located on the left side behind her knee. Started 3-4 days ago. Worse when she sits down or puts pressure on it. It reminds her of her pain when she had a dvt in the past. The patient is on Xarelto.    Past Medical History:  Diagnosis Date  . Cervical dysplasia   . COPD (chronic obstructive pulmonary disease) (HCC)   . Discharge from the vagina 11/19/2014  . Fibromyalgia   . H/O: obesity 04/06/2014  . Hematuria   . Herpes, genital   . Microscopic hematuria 11/23/2015  . Mixed incontinence   . MS (multiple sclerosis) (HCC)   . Multiple sclerosis (HCC) 06/03/2015  . OAB (overactive bladder)   . Obesity   . Pulmonary embolism (HCC)   . Snores     Patient Active Problem List   Diagnosis Date Noted  . Urge urinary incontinence 11/23/2015  . Microscopic hematuria 11/23/2015  . Abnormal cells of cervix 06/03/2015  . Fibromyalgia 06/03/2015  . Multiple sclerosis (HCC) 06/03/2015  . Discharge from the vagina 11/19/2014  . Pulmonary embolism (HCC) 04/06/2014  . Mild chronic obstructive pulmonary disease (HCC) 04/06/2014  . Snores 04/06/2014  . H/O: obesity 04/06/2014    Past Surgical History:  Procedure Laterality Date  . ABDOMINAL HYSTERECTOMY    . Bladder mesh    . CHOLECYSTECTOMY    . Chonca batosa    . DILATION AND CURETTAGE OF UTERUS    . LASER ABLATION CONDYLOMA CERVICAL / VULVAR      Prior to Admission medications   Medication Sig Start Date End Date Taking? Authorizing Provider  albuterol (PROAIR HFA) 108 (90 BASE) MCG/ACT inhaler Inhale into the lungs. 07/16/14 07/16/15  Historical Provider, MD   Cholecalciferol (VITAMIN D3) 2000 UNITS capsule Take by mouth.    Historical Provider, MD  CVS SALINE NASAL SPRAY 0.65 % nasal spray 1 spray 2 (two) times daily. 04/30/15   Historical Provider, MD  Cyanocobalamin (RA VITAMIN B-12 TR) 1000 MCG TBCR Take by mouth. Reported on 11/19/2015    Historical Provider, MD  fesoterodine (TOVIAZ) 8 MG TB24 tablet Take 1 tablet (8 mg total) by mouth daily. 07/21/16   Alfredo Martinez, MD  fluticasone (FLONASE) 50 MCG/ACT nasal spray Place into the nose.    Historical Provider, MD  HYDROcodone-acetaminophen (NORCO/VICODIN) 5-325 MG tablet Take 1 tablet by mouth every 8 (eight) hours as needed. for pain 02/28/15   Historical Provider, MD  LYRICA 150 MG capsule Take 150 mg by mouth 2 (two) times daily. 04/30/15   Historical Provider, MD  Magnesium 250 MG TABS Take by mouth.    Historical Provider, MD  meclizine (ANTIVERT) 12.5 MG tablet TAKE 1 TABLET BY MOUTH 3 TIMES A DAY AS NEEDED FOR DIZZINESS 10/18/15   Historical Provider, MD  niacin 100 MG tablet Take by mouth.    Historical Provider, MD  pantoprazole (PROTONIX) 40 MG tablet  06/03/15   Historical Provider, MD  Phendimetrazine Tartrate 105 MG CP24 Reported on 11/19/2015 04/30/15   Historical Provider, MD  phentermine (ADIPEX-P) 37.5 MG tablet Reported on 11/19/2015 04/02/15   Historical Provider, MD  sertraline (ZOLOFT) 100 MG tablet Take by  mouth.    Historical Provider, MD  valACYclovir (VALTREX) 500 MG tablet Take by mouth.    Historical Provider, MD  XARELTO 20 MG TABS tablet Take 20 mg by mouth daily. 05/26/15   Historical Provider, MD    Allergies Cefaclor; Penicillins; Phenytoin; and Phenytoin sodium extended  Family History  Problem Relation Age of Onset  . Hypertension Mother   . Hypertension Father     Social History Social History  Substance Use Topics  . Smoking status: Current Every Day Smoker    Packs/day: 0.50    Types: Cigarettes  . Smokeless tobacco: Never Used  . Alcohol use No     Review of Systems  Constitutional: Negative for fever. Cardiovascular: Negative for chest pain. Respiratory: Negative for shortness of breath. Gastrointestinal: Negative for abdominal pain, vomiting and diarrhea. Musculoskeletal: Positive for left leg pain. Skin: Negative for rash.  10-point ROS otherwise negative.  ____________________________________________   PHYSICAL EXAM:  VITAL SIGNS: ED Triage Vitals  Enc Vitals Group     BP 07/21/16 1803 125/75     Pulse Rate 07/21/16 1803 86     Resp 07/21/16 1803 20     Temp 07/21/16 1803 97.9 F (36.6 C)     Temp Source 07/21/16 1803 Oral     SpO2 07/21/16 1803 97 %     Weight 07/21/16 1805 224 lb (101.6 kg)     Height 07/21/16 1805 5\' 3"  (1.6 m)     Head Circumference --      Peak Flow --      Pain Score 07/21/16 1805 7    Constitutional: Alert and oriented. Well appearing and in no distress. Eyes: Conjunctivae are normal. Normal extraocular movements. ENT   Head: Normocephalic and atraumatic.   Nose: No congestion/rhinnorhea.   Mouth/Throat: Mucous membranes are moist.   Neck: No stridor. Hematological/Lymphatic/Immunilogical: No cervical lymphadenopathy. Cardiovascular: Normal rate, regular rhythm.  No murmurs, rubs, or gallops.  Respiratory: Normal respiratory effort without tachypnea nor retractions. Breath sounds are clear and equal bilaterally. No wheezes/rales/rhonchi. Gastrointestinal: Soft and non tender. No rebound. No guarding.  Genitourinary: Deferred Musculoskeletal: Normal range of motion in all extremities. No lower extremity edema. Neurologic:  Normal speech and language. No gross focal neurologic deficits are appreciated.  Skin:  Skin is warm, dry and intact. No rash noted. Psychiatric: Mood and affect are normal. Speech and behavior are normal. Patient exhibits appropriate insight and judgment.  ____________________________________________    LABS (pertinent  positives/negatives)  None  ____________________________________________   EKG  None  ____________________________________________    RADIOLOGY  US venous LLE  IMPRESSION: No evidence of deep venous thrombosis. ____________________________________________   PROCEDURES  Procedures  ____________________________________________   INITIAL IMPRESSION / ASSESSMENT AND PLAN / ED COURSE  Pertinent labs & imaging results that were available during my care of the patient were reviewed by me and considered in my medical decision making (see chart for details).  Patient with pain in her left leg just behind her knee. Concern for DVT given history of same in the past. [place] however without DVT. No trauma to the leg. At this point think likely soft tissue related. Will have patient follow up with PCP.  ____________________________________________   FINAL CLINICAL IMPRESSION(S) / ED DIAGNOSES  Final diagnoses:  Left leg pain     Note: This dictation was prepared with Dragon dictation. Any transcriptional errors that result from this process are unintentional     Phineas Semen, MD 07/21/16 1946

## 2016-07-21 NOTE — ED Notes (Signed)
Pt returned from [place], stated she needed to urinate. Asked if she was dizzy or weak, she denied both. Asked pt if pain with walking on leg, she denied. Ambulated to bathroom with steady gait, no assistance.

## 2016-07-31 IMAGING — US US EXTREM LOW VENOUS*R*
1 series · 14 of 24 positions shown · non-contrast
Comparison: 07/25/2014

CLINICAL DATA: Pain x3 days.  History of PE.

EXAM:
RIGHT LOWER EXTREMITY VENOUS DOPPLER ULTRASOUND
TECHNIQUE: Gray-scale sonography with compression, as well as color and duplex
ultrasound, were performed to evaluate the deep venous system from
the level of the common femoral vein through the popliteal and
proximal calf veins.

[Series 1: us extrem low venous*right* · 0.08mm/px · 14 of 34 slices shown]
[im 1/34]
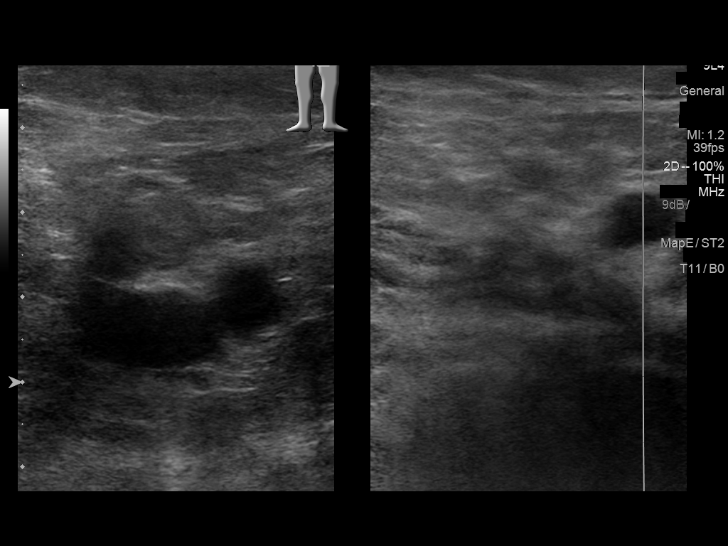
[im 3/34]
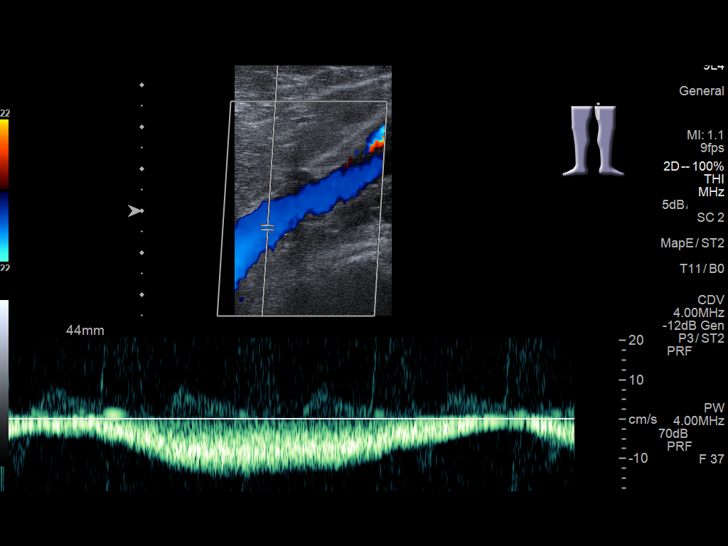
[im 6/34]
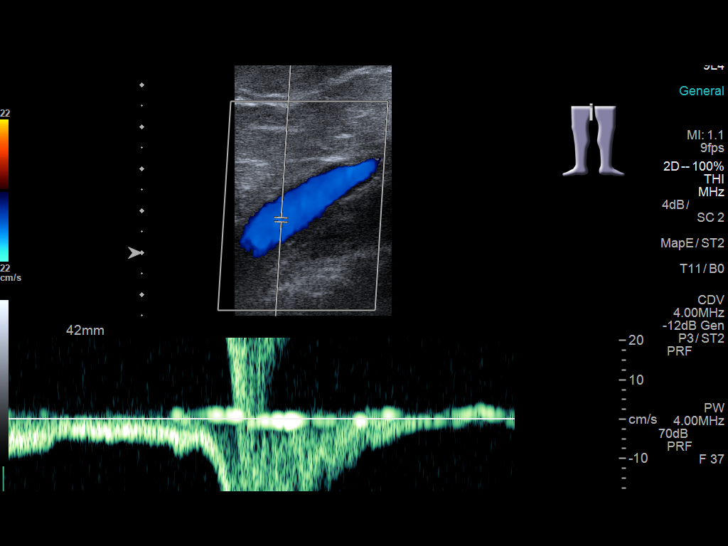
[im 9/34]
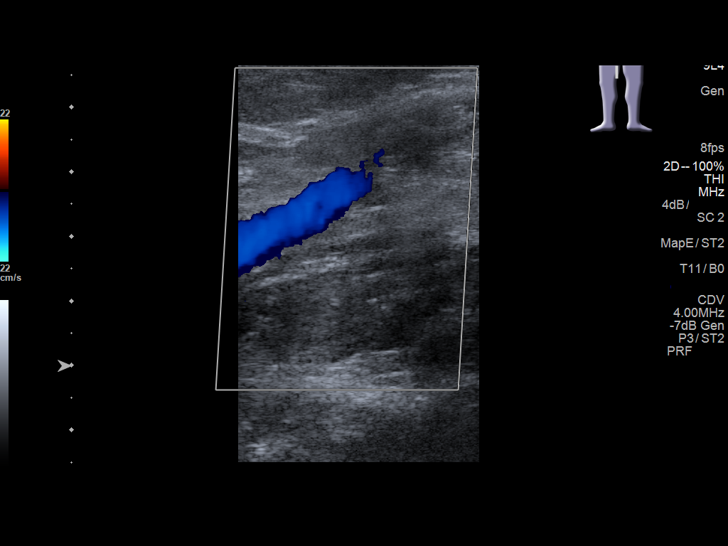
[im 11/34]
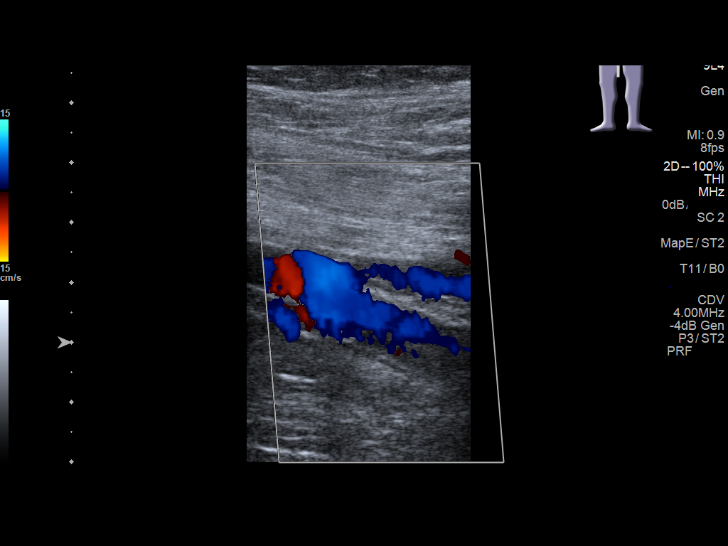
[im 13/34]
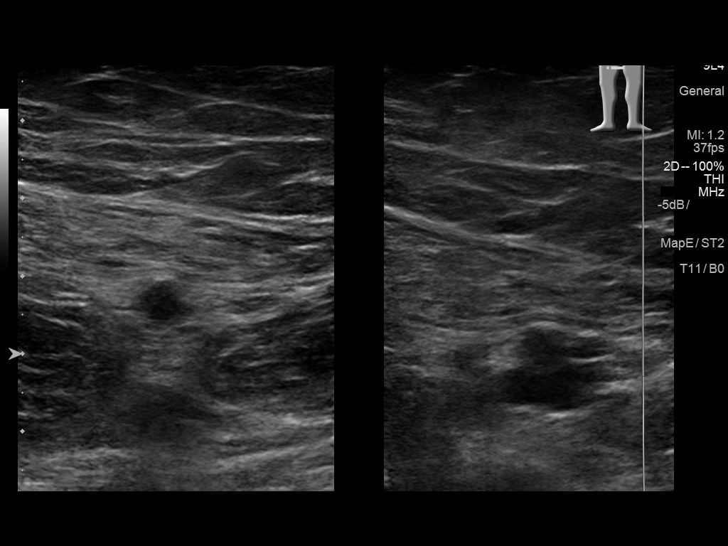
[im 16/34]
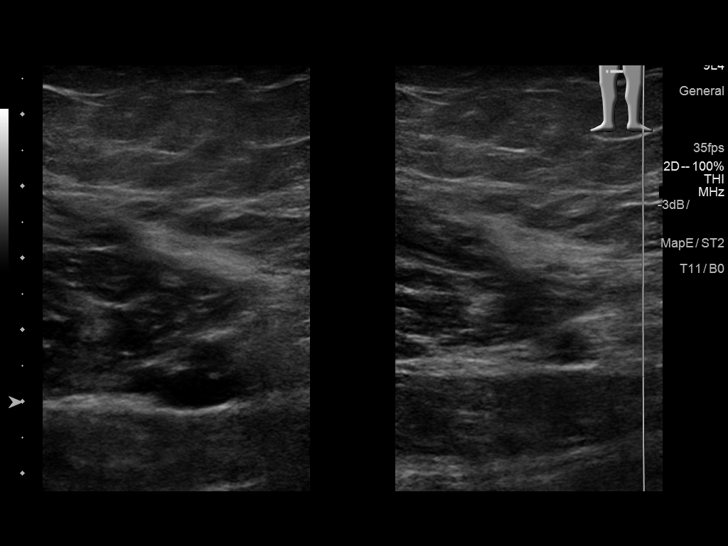
[im 18/34]
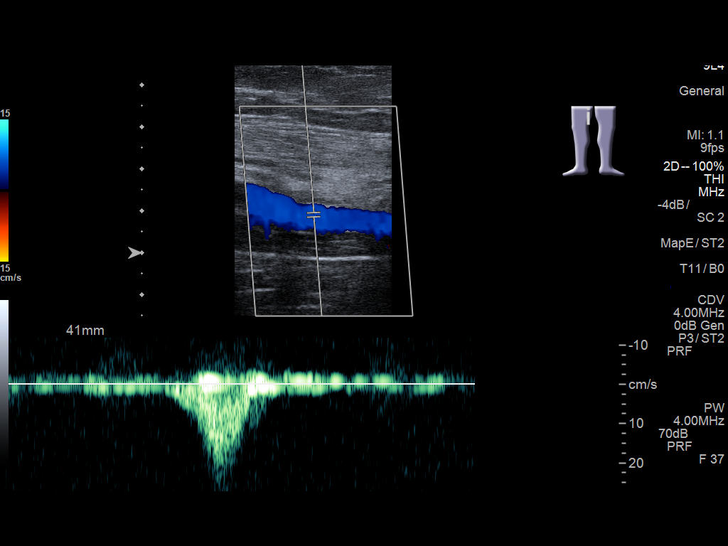
[im 21/34]
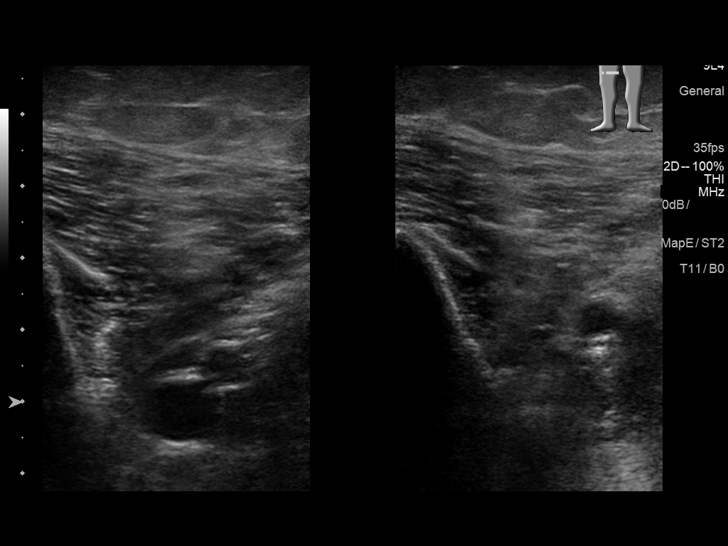
[im 23/34]
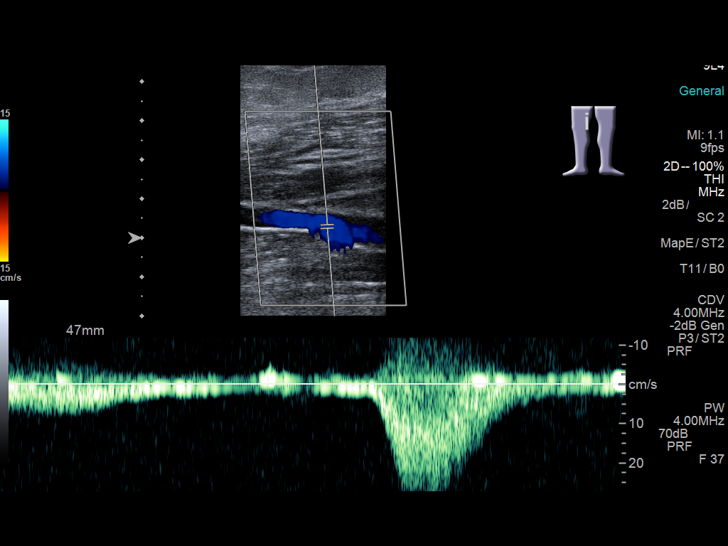
[im 26/34]
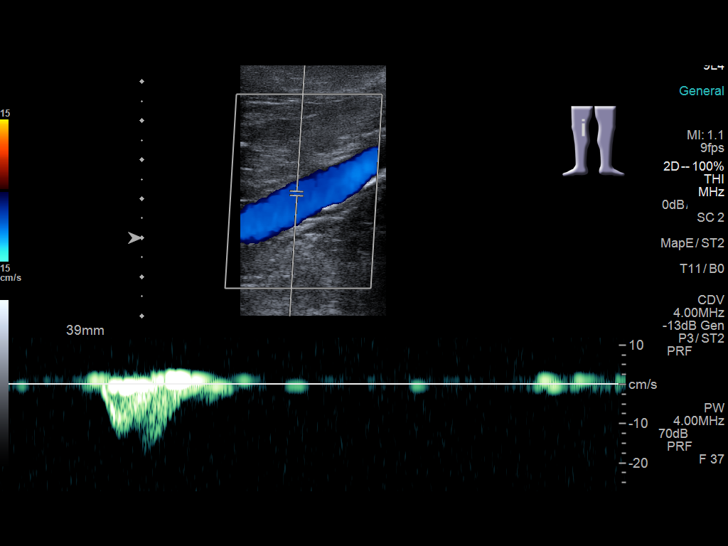
[im 28/34]
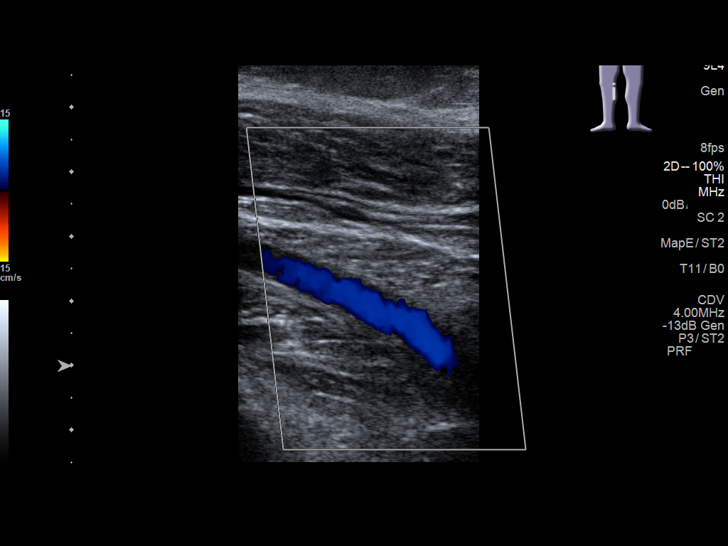
[im 31/34]
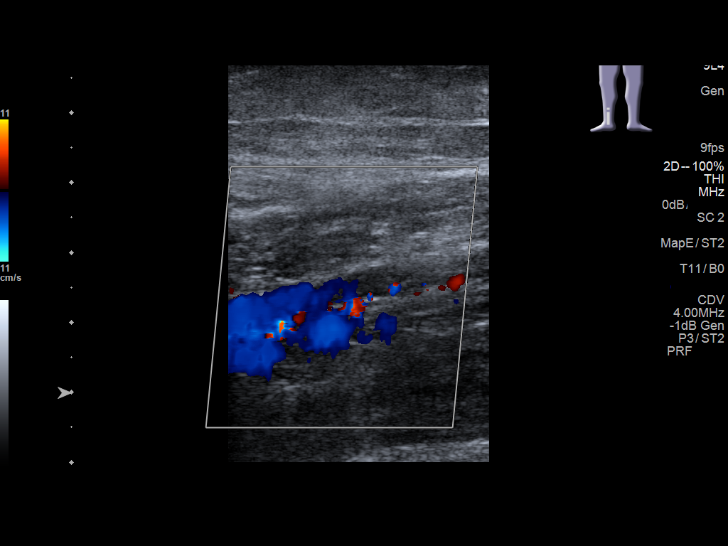
[im 34/34]
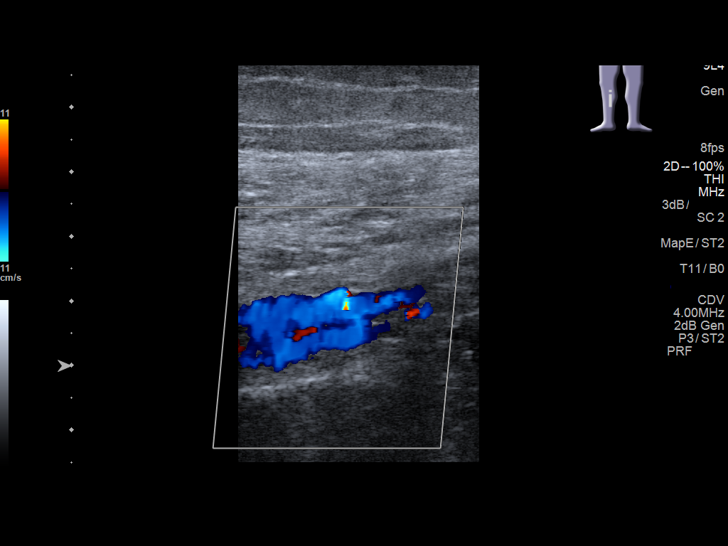

[14 of 24 positions shown; findings below may reference images not displayed]

FINDINGS: Normal compressibility of the common femoral, superficial femoral,
and popliteal veins, as well as the proximal calf veins. No filling
defects to suggest DVT on grayscale or color Doppler imaging.
Doppler waveforms show normal direction of venous flow, normal
respiratory phasicity and response to augmentation. Survey views of
the contralateral common femoral vein are unremarkable.
IMPRESSION: No evidence of right lower extremity deep vein thrombosis.

## 2016-08-03 ENCOUNTER — Telehealth: Payer: Self-pay | Admitting: *Deleted

## 2016-08-03 NOTE — Telephone Encounter (Signed)
Spoke with patient and let her know from incoming call to Tawanna Sat inquiring about PTNS that I was not told that she needed to be set up for the procedure. I let her know that I will start the authorization process and I will call her back when I find out it the procedure is approved or not. Patient ok with the plan.

## 2016-08-07 ENCOUNTER — Telehealth: Payer: Self-pay | Admitting: *Deleted

## 2016-08-07 NOTE — Telephone Encounter (Signed)
Spoke with patient and let her know that I have sent everything needed for the prior auth to her insurance company for her PTNS. I let her know as soon as I hear from them about an approval or denial I will let her know. Patient ok with plan.

## 2016-08-14 ENCOUNTER — Telehealth: Payer: Self-pay | Admitting: *Deleted

## 2016-08-14 NOTE — Telephone Encounter (Signed)
LMOM for patient to call back so we can pick a time on Wednesdays to book her twelve PTNS appointments. I have received approval for her 12 sessions.

## 2016-08-17 NOTE — Telephone Encounter (Signed)
Spoke with patient and let her know I have her approval for PTNS for the 12. Patient will need to be reapproved after the 12 to get more. Wednedays at 10:30 starting next week. Patient Transferred to [facility] to make appointments.

## 2016-08-24 DIAGNOSIS — H6090 Unspecified otitis externa, unspecified ear: Secondary | ICD-10-CM | POA: Diagnosis not present

## 2016-08-24 DIAGNOSIS — J0140 Acute pansinusitis, unspecified: Secondary | ICD-10-CM | POA: Diagnosis not present

## 2016-08-24 DIAGNOSIS — N959 Unspecified menopausal and perimenopausal disorder: Secondary | ICD-10-CM | POA: Diagnosis not present

## 2016-08-24 DIAGNOSIS — F17210 Nicotine dependence, cigarettes, uncomplicated: Secondary | ICD-10-CM | POA: Diagnosis not present

## 2016-08-28 DIAGNOSIS — E559 Vitamin D deficiency, unspecified: Secondary | ICD-10-CM | POA: Diagnosis not present

## 2016-08-28 DIAGNOSIS — Z0001 Encounter for general adult medical examination with abnormal findings: Secondary | ICD-10-CM | POA: Diagnosis not present

## 2016-08-28 DIAGNOSIS — N959 Unspecified menopausal and perimenopausal disorder: Secondary | ICD-10-CM | POA: Diagnosis not present

## 2016-08-28 DIAGNOSIS — D519 Vitamin B12 deficiency anemia, unspecified: Secondary | ICD-10-CM | POA: Diagnosis not present

## 2016-08-28 DIAGNOSIS — I10 Essential (primary) hypertension: Secondary | ICD-10-CM | POA: Diagnosis not present

## 2016-09-02 ENCOUNTER — Ambulatory Visit: Payer: Medicare HMO | Admitting: Urology

## 2016-09-09 ENCOUNTER — Ambulatory Visit (INDEPENDENT_AMBULATORY_CARE_PROVIDER_SITE_OTHER): Payer: Medicare HMO | Admitting: Urology

## 2016-09-09 ENCOUNTER — Encounter: Payer: Self-pay | Admitting: Urology

## 2016-09-09 VITALS — BP 119/87 | HR 94 | Ht 63.0 in | Wt 228.6 lb

## 2016-09-09 DIAGNOSIS — N3941 Urge incontinence: Secondary | ICD-10-CM | POA: Diagnosis not present

## 2016-09-09 LAB — PTNS-PERCUTANEOUS TIBIAL NERVE STIMULATION

## 2016-09-09 NOTE — Progress Notes (Signed)
Chief Complaint: No chief complaint on file.    HPI: 51 yo WF who presents today for her first of twelve weekly PTNS treatments.  Roshad Hack: Patient is a 51 year old Caucasian female with MS and a history of GU issues including urethral discharge/bleeding and OAB symptoms including mixed urinary incontinence who was given a trial Myrbetriq 25 mg daily who presents today for follow-up.  She had been on Vesicare 5 mg daily in the past with good symptom control, but started to lose its effectiveness and the dry mouth was becoming bothersome to her.  She states that she did not find the Myrbetriq samples beneficial. She felt that the samples did not curb her urinary urge incontinence. She felt the Vesicare provided more benefit.  Dr. Sherron Monday When I saw the patient in May she was much better on Toviaz 8 mg and had no microscopic hematuria.  The [name] is working but she still can leak when she goes from a sitting to standing position. Her frequency is stable. She does not wear pads but sometimes has to change her underclothes.  She understands that we've likely maximize medical therapy and I talked her about percutaneous tibial nerve stimulation with my usual template. She would like to try this therapy in combination with Toviaz. If she does beautifully the [name] could be stopped.  I will order a percutaneous tibial nerve stimulation and reassess her in 3 months  She is experiencing 4 daytime voids, 1 night time voids and 0 incontinence over the last week.             Previous Therapy:     Behavioral Modification Techniques            Pelvic Floor Exercises - insurance would not cover           Biofeedback - insurance would not cover           Bladder Training - insurance would not cover           Fluid Management - encourage water intake           Dietary Restrictions - encourage smoking sensation       Contraindications present for PTNS      Pacemaker - NO      Implantable  defibrillator - NO      History of abnormal bleeding - NO      History of neuropathies or nerve damage - NO  Discussed with patient possible complications of procedure, such as discomfort, bleeding at insertion/stimulation site, procedure consent signed  Patient goals:     Reduced urgency  PMH: Past Medical History:  Diagnosis Date  . Cervical dysplasia   . COPD (chronic obstructive pulmonary disease) (HCC)   . Discharge from the vagina 11/19/2014  . Fibromyalgia   . H/O: obesity 04/06/2014  . Hematuria   . Herpes, genital   . Microscopic hematuria 11/23/2015  . Mixed incontinence   . MS (multiple sclerosis) (HCC)   . Multiple sclerosis (HCC) 06/03/2015  . OAB (overactive bladder)   . Obesity   . Pulmonary embolism (HCC)   . Snores     Surgical History: Past Surgical History:  Procedure Laterality Date  . ABDOMINAL HYSTERECTOMY    . Bladder mesh    . CHOLECYSTECTOMY    . Chonca batosa    . DILATION AND CURETTAGE OF UTERUS    . LASER ABLATION CONDYLOMA CERVICAL / VULVAR      Home Medications:  Allergies as of 09/09/2016  Reactions   Cefaclor Other (See Comments)   Other reaction(s): Unknown   Penicillins    Other reaction(s): Unknown   Phenytoin    Other reaction(s): Unknown   Phenytoin Sodium Extended Other (See Comments)      Medication List       Accurate as of 09/09/16 10:51 AM. Always use your most recent med list.          CVS SALINE NASAL SPRAY 0.65 % nasal spray Generic drug:  sodium chloride 1 spray 2 (two) times daily.   fesoterodine 8 MG Tb24 tablet Commonly known as:  TOVIAZ Take 1 tablet (8 mg total) by mouth daily.   fluticasone 50 MCG/ACT nasal spray Commonly known as:  FLONASE Place into the nose.   HYDROcodone-acetaminophen 5-325 MG tablet Commonly known as:  NORCO/VICODIN Take 1 tablet by mouth every 8 (eight) hours as needed. for pain   LYRICA 150 MG capsule Generic drug:  pregabalin Take 150 mg by mouth 2 (two) times  daily.   Magnesium 250 MG Tabs Take by mouth.   meclizine 12.5 MG tablet Commonly known as:  ANTIVERT TAKE 1 TABLET BY MOUTH 3 TIMES A DAY AS NEEDED FOR DIZZINESS   niacin 100 MG tablet Take by mouth.   pantoprazole 40 MG tablet Commonly known as:  PROTONIX   Phendimetrazine Tartrate 105 MG Cp24 Reported on 11/19/2015   phentermine 37.5 MG tablet Commonly known as:  ADIPEX-P Reported on 11/19/2015   PROAIR HFA 108 (90 Base) MCG/ACT inhaler Generic drug:  albuterol Inhale into the lungs.   RA VITAMIN B-12 TR 1000 MCG Tbcr Generic drug:  Cyanocobalamin Take by mouth. Reported on 11/19/2015   sertraline 100 MG tablet Commonly known as:  ZOLOFT Take by mouth.   valACYclovir 500 MG tablet Commonly known as:  VALTREX Take by mouth.   Vitamin D3 2000 units capsule Take by mouth.   XARELTO 20 MG Tabs tablet Generic drug:  rivaroxaban Take 20 mg by mouth daily.       Allergies:  Allergies  Allergen Reactions  . Cefaclor Other (See Comments)    Other reaction(s): Unknown  . Penicillins     Other reaction(s): Unknown  . Phenytoin     Other reaction(s): Unknown  . Phenytoin Sodium Extended Other (See Comments)    Family History: Family History  Problem Relation Age of Onset  . Hypertension Mother   . Hypertension Father     Social History:  reports that she has been smoking Cigarettes.  She has been smoking about 0.50 packs per day. She has never used smokeless tobacco. She reports that she does not drink alcohol or use drugs.  ROS: UROLOGY Frequent Urination?: No Hard to postpone urination?: Yes Burning/pain with urination?: No Get up at night to urinate?: No Leakage of urine?: No Urine stream starts and stops?: Yes Trouble starting stream?: No Do you have to strain to urinate?: No Blood in urine?: No Urinary tract infection?: No Sexually transmitted disease?: No Injury to kidneys or bladder?: No Painful intercourse?: No Weak stream?: No Currently  pregnant?: No Vaginal bleeding?: No Last menstrual period?: n  Gastrointestinal Nausea?: No Vomiting?: No Indigestion/heartburn?: No Diarrhea?: No Constipation?: No  Constitutional Fever: No Night sweats?: No Weight loss?: No Fatigue?: No  Skin Skin rash/lesions?: No Itching?: No  Eyes Blurred vision?: No Double vision?: No  Ears/Nose/Throat Sore throat?: No Sinus problems?: No  Hematologic/Lymphatic Swollen glands?: No Easy bruising?: Yes  Cardiovascular Leg swelling?: No Chest pain?: No  Respiratory  Cough?: No Shortness of breath?: No  Endocrine Excessive thirst?: No  Musculoskeletal Back pain?: No Joint pain?: No  Neurological Headaches?: No Dizziness?: No  Psychologic Depression?: No Anxiety?: Yes   Physical Exam: BP 119/87   Pulse 94   Ht 5\' 3"  (1.6 m)   Wt 228 lb 9.6 oz (103.7 kg)   BMI 40.49 kg/m   Constitutional: Well nourished. Alert and oriented, No acute distress. HEENT: [place] AT, moist mucus membranes. Trachea midline, no masses. Cardiovascular: No clubbing, cyanosis, or edema. Respiratory: Normal respiratory effort, no increased work of breathing. Skin: No rashes, bruises or suspicious lesions. Lymph: No cervical or inguinal adenopathy. Neurologic: Grossly intact, no focal deficits, moving all 4 extremities. Psychiatric: Normal mood and affect.  Laboratory Data: Lab Results  Component Value Date   WBC 6.6 04/12/2014   HGB 12.5 04/12/2014   HCT 37.0 04/12/2014   MCV 91 04/12/2014   PLT 278 04/12/2014    Lab Results  Component Value Date   CREATININE 0.74 04/12/2014    Lab Results  Component Value Date   AST 21 03/30/2014   Lab Results  Component Value Date   ALT 26 03/30/2014    PTNS treatment: The needle electrode was inserted into the lower, inner aspect of the patient's left leg. The surface electrode was placed on the inside arch of the foot on the treatment leg. The lead set was connected to the  stimulator and the needle electrode clip was connected to the needle electrode. The stimulator that produces an adjustable electrical pulse that travels to the sacral nerve plexus via the tibial nerve was increased to 6 and tell the patient received a toe flex and a sensory response.     Assessment & Plan:    1. Urge incontinence  Treatment Plan:  The needle electrode was removed without difficulty to the patient.  Patient tolerated the procedure for 30 minutes.  She will return next week for # 2 out of 12 of their weekly PTNS treatment's    Return in about 1 week (around 09/16/2016) for # 2 PTNS.  These notes generated with voice recognition software. I apologize for typographical errors.  Michiel Cowboy, PA-C  [facility] [address], [address] [place], [place] [address] [phone]

## 2016-09-16 ENCOUNTER — Ambulatory Visit (INDEPENDENT_AMBULATORY_CARE_PROVIDER_SITE_OTHER): Payer: Medicare HMO | Admitting: Urology

## 2016-09-16 ENCOUNTER — Encounter: Payer: Self-pay | Admitting: Urology

## 2016-09-16 VITALS — BP 126/79 | HR 82 | Ht 63.0 in | Wt 230.8 lb

## 2016-09-16 DIAGNOSIS — N3941 Urge incontinence: Secondary | ICD-10-CM

## 2016-09-16 LAB — PTNS-PERCUTANEOUS TIBIAL NERVE STIMULATION: Scan Result: 9

## 2016-09-16 NOTE — Progress Notes (Signed)
Chief Complaint:  Chief Complaint  Patient presents with  . Urinary Incontinence     HPI: 51 yo WF who presents today for her second of twelve weekly PTNS treatments.  Vinay Ertl: Patient is a 51 year old Caucasian female with MS and a history of GU issues including urethral discharge/bleeding and OAB symptoms including mixed urinary incontinence who was given a trial Myrbetriq 25 mg daily who presents today for follow-up.  She had been on Vesicare 5 mg daily in the past with good symptom control, but started to lose its effectiveness and the dry mouth was becoming bothersome to her.  She states that she did not find the Myrbetriq samples beneficial. She felt that the samples did not curb her urinary urge incontinence. She felt the Vesicare provided more benefit.  Dr. Sherron Monday When I saw the patient in May she was much better on Toviaz 8 mg and had no microscopic hematuria.  The [name] is working but she still can leak when she goes from a sitting to standing position. Her frequency is stable. She does not wear pads but sometimes has to change her underclothes.  She understands that we've likely maximize medical therapy and I talked her about percutaneous tibial nerve stimulation with my usual template. She would like to try this therapy in combination with Toviaz. If she does beautifully the [name] could be stopped.  I will order a percutaneous tibial nerve stimulation and reassess her in 3 months  Baseline symptoms are 4 daytime voids, 1 night time voids and 0 incontinence over the last week.    Today, she is complaining of frequency, urgency and intermittency.  She is having 5 day time voids (worse), 1 night time void  (stable) and 1 incontinence episode daily (stable).   She is not having dysuria, gross hematuria and suprapubic pain.  She is not having fevers, chills, nausea and vomiting.             Previous Therapy:     Behavioral Modification Techniques            Pelvic  Floor Exercises - insurance would not cover           Biofeedback - insurance would not cover           Bladder Training - insurance would not cover           Fluid Management - encourage water intake           Dietary Restrictions - encourage smoking sensation       Contraindications present for PTNS      Pacemaker - NO      Implantable defibrillator - NO      History of abnormal bleeding - NO      History of neuropathies or nerve damage - NO  Discussed with patient possible complications of procedure, such as discomfort, bleeding at insertion/stimulation site, procedure consent signed  Patient goals:     Reduced urgency  PMH: Past Medical History:  Diagnosis Date  . Cervical dysplasia   . COPD (chronic obstructive pulmonary disease) (HCC)   . Discharge from the vagina 11/19/2014  . Fibromyalgia   . H/O: obesity 04/06/2014  . Hematuria   . Herpes, genital   . Microscopic hematuria 11/23/2015  . Mixed incontinence   . MS (multiple sclerosis) (HCC)   . Multiple sclerosis (HCC) 06/03/2015  . OAB (overactive bladder)   . Obesity   . Pulmonary embolism (HCC)   . Snores  Surgical History: Past Surgical History:  Procedure Laterality Date  . ABDOMINAL HYSTERECTOMY    . Bladder mesh    . CHOLECYSTECTOMY    . Chonca batosa    . DILATION AND CURETTAGE OF UTERUS    . LASER ABLATION CONDYLOMA CERVICAL / VULVAR      Home Medications:  Allergies as of 09/16/2016      Reactions   Cefaclor Other (See Comments)   Other reaction(s): Unknown   Penicillins    Other reaction(s): Unknown   Phenytoin    Other reaction(s): Unknown   Phenytoin Sodium Extended Other (See Comments)      Medication List       Accurate as of 09/16/16 11:27 AM. Always use your most recent med list.          [facility] SALINE NASAL SPRAY 0.65 % nasal spray Generic drug:  sodium chloride 1 spray 2 (two) times daily.   fesoterodine 8 MG Tb24 tablet Commonly known as:  TOVIAZ Take 1 tablet (8 mg total)  by mouth daily.   fluticasone 50 MCG/ACT nasal spray Commonly known as:  FLONASE Place into the nose.   HYDROcodone-acetaminophen 5-325 MG tablet Commonly known as:  NORCO/VICODIN Take 1 tablet by mouth every 8 (eight) hours as needed. for pain   LYRICA 150 MG capsule Generic drug:  pregabalin Take 150 mg by mouth 2 (two) times daily.   Magnesium 250 MG Tabs Take by mouth.   meclizine 12.5 MG tablet Commonly known as:  ANTIVERT TAKE 1 TABLET BY MOUTH 3 TIMES A DAY AS NEEDED FOR DIZZINESS   niacin 100 MG tablet Take by mouth.   pantoprazole 40 MG tablet Commonly known as:  PROTONIX   Phendimetrazine Tartrate 105 MG Cp24 Reported on 11/19/2015   phentermine 37.5 MG tablet Commonly known as:  ADIPEX-P Reported on 11/19/2015   PROAIR HFA 108 (90 Base) MCG/ACT inhaler Generic drug:  albuterol Inhale into the lungs.   RA VITAMIN B-12 TR 1000 MCG Tbcr Generic drug:  Cyanocobalamin Take by mouth. Reported on 11/19/2015   sertraline 100 MG tablet Commonly known as:  ZOLOFT Take by mouth.   valACYclovir 500 MG tablet Commonly known as:  VALTREX Take by mouth.   Vitamin D3 2000 units capsule Take by mouth.   XARELTO 20 MG Tabs tablet Generic drug:  rivaroxaban Take 20 mg by mouth daily.       Allergies:  Allergies  Allergen Reactions  . Cefaclor Other (See Comments)    Other reaction(s): Unknown  . Penicillins     Other reaction(s): Unknown  . Phenytoin     Other reaction(s): Unknown  . Phenytoin Sodium Extended Other (See Comments)    Family History: Family History  Problem Relation Age of Onset  . Hypertension Mother   . Hypertension Father     Social History:  reports that she has been smoking Cigarettes.  She has been smoking about 0.50 packs per day. She has never used smokeless tobacco. She reports that she does not drink alcohol or use drugs.  ROS: UROLOGY Frequent Urination?: Yes Hard to postpone urination?: Yes Burning/pain with urination?:  No Get up at night to urinate?: No Leakage of urine?: No Urine stream starts and stops?: Yes Trouble starting stream?: No Do you have to strain to urinate?: No Blood in urine?: No Urinary tract infection?: No Sexually transmitted disease?: No Injury to kidneys or bladder?: No Painful intercourse?: No Weak stream?: No Currently pregnant?: No Vaginal bleeding?: No Last menstrual period?:  n  Gastrointestinal Nausea?: No Vomiting?: No Indigestion/heartburn?: No Diarrhea?: No Constipation?: No  Constitutional Fever: No Night sweats?: No Weight loss?: No Fatigue?: Yes  Skin Skin rash/lesions?: No Itching?: No  Eyes Blurred vision?: No Double vision?: No  Ears/Nose/Throat Sore throat?: No Sinus problems?: No  Hematologic/Lymphatic Swollen glands?: No Easy bruising?: Yes  Cardiovascular Leg swelling?: No Chest pain?: No  Respiratory Cough?: No Shortness of breath?: No  Endocrine Excessive thirst?: No  Musculoskeletal Back pain?: Yes Joint pain?: Yes  Neurological Headaches?: No Dizziness?: No  Psychologic Depression?: No Anxiety?: Yes   Physical Exam: BP 126/79 (BP Location: Left Arm, Patient Position: Sitting, Cuff Size: Large)   Pulse 82   Ht 5\' 3"  (1.6 m)   Wt 230 lb 12.8 oz (104.7 kg)   BMI 40.88 kg/m   Constitutional: Well nourished. Alert and oriented, No acute distress. HEENT: [place] AT, moist mucus membranes. Trachea midline, no masses. Cardiovascular: No clubbing, cyanosis, or edema. Respiratory: Normal respiratory effort, no increased work of breathing. Skin: No rashes, bruises or suspicious lesions. Lymph: No cervical or inguinal adenopathy. Neurologic: Grossly intact, no focal deficits, moving all 4 extremities. Psychiatric: Normal mood and affect.  Laboratory Data: Lab Results  Component Value Date   WBC 6.6 04/12/2014   HGB 12.5 04/12/2014   HCT 37.0 04/12/2014   MCV 91 04/12/2014   PLT 278 04/12/2014    Lab Results    Component Value Date   CREATININE 0.74 04/12/2014    Lab Results  Component Value Date   AST 21 03/30/2014   Lab Results  Component Value Date   ALT 26 03/30/2014    PTNS treatment: The needle electrode was inserted into the lower, inner aspect of the patient's right leg. The surface electrode was placed on the inside arch of the foot on the treatment leg. The lead set was connected to the stimulator and the needle electrode clip was connected to the needle electrode. The stimulator that produces an adjustable electrical pulse that travels to the sacral nerve plexus via the tibial nerve was increased to 9 until the patient received a toe flex and sensory response.      Assessment & Plan:    1. Urge incontinence  Treatment Plan:  The needle electrode was removed without difficulty to the patient.  Patient tolerated the procedure for 30 minutes.  She will return next week for # 3 out of 12 of their weekly PTNS treatment's   Return in about 1 week (around 09/23/2016) for # 3 PTNS.  These notes generated with voice recognition software. I apologize for typographical errors.  Michiel Cowboy, PA-C  [facility] [address] [place], [place] [address] [phone]

## 2016-09-23 ENCOUNTER — Ambulatory Visit (INDEPENDENT_AMBULATORY_CARE_PROVIDER_SITE_OTHER): Payer: Medicare HMO | Admitting: Urology

## 2016-09-23 ENCOUNTER — Encounter: Payer: Self-pay | Admitting: Urology

## 2016-09-23 VITALS — BP 120/79 | HR 94 | Ht 63.0 in | Wt 220.0 lb

## 2016-09-23 DIAGNOSIS — N3941 Urge incontinence: Secondary | ICD-10-CM

## 2016-09-23 NOTE — Progress Notes (Signed)
Chief Complaint:  Chief Complaint  Patient presents with  . PTNS    #3     HPI: 51 yo WF who presents today for her third of twelve weekly PTNS treatments.  Jochebed Bills: Patient is a 51 year old Caucasian female with MS and a history of GU issues including urethral discharge/bleeding and OAB symptoms including mixed urinary incontinence who was given a trial Myrbetriq 25 mg daily who presents today for follow-up.  She had been on Vesicare 5 mg daily in the past with good symptom control, but started to lose its effectiveness and the dry mouth was becoming bothersome to her.  She states that she did not find the Myrbetriq samples beneficial. She felt that the samples did not curb her urinary urge incontinence. She felt the Vesicare provided more benefit.  Dr. Sherron Monday When I saw the patient in May she was much better on Toviaz 8 mg and had no microscopic hematuria.  The [name] is working but she still can leak when she goes from a sitting to standing position. Her frequency is stable. She does not wear pads but sometimes has to change her underclothes.  She understands that we've likely maximize medical therapy and I talked her about percutaneous tibial nerve stimulation with my usual template. She would like to try this therapy in combination with Toviaz. If she does beautifully the [name] could be stopped.  I will order a percutaneous tibial nerve stimulation and reassess her in 3 months  Baseline symptoms are 4 daytime voids, 1 night time voids and 0 incontinence over the last week.    Today, she is complaining of frequency, urgency and intermittency.  She is having 5 day time voids (stable), 1 night time void  (stable) and 0 incontinence episode daily (improved).   She is not having dysuria, gross hematuria and suprapubic pain.  She is not having fevers, chills, nausea and vomiting.             Previous Therapy:     Behavioral Modification Techniques            Pelvic Floor  Exercises - insurance would not cover           Biofeedback - insurance would not cover           Bladder Training - insurance would not cover           Fluid Management - encourage water intake           Dietary Restrictions - encourage smoking sensation       Contraindications present for PTNS      Pacemaker - NO      Implantable defibrillator - NO      History of abnormal bleeding - NO      History of neuropathies or nerve damage - NO  Discussed with patient possible complications of procedure, such as discomfort, bleeding at insertion/stimulation site, procedure consent signed  Patient goals:     Reduced urgency  PMH: Past Medical History:  Diagnosis Date  . Cervical dysplasia   . COPD (chronic obstructive pulmonary disease) (HCC)   . Discharge from the vagina 11/19/2014  . Fibromyalgia   . H/O: obesity 04/06/2014  . Hematuria   . Herpes, genital   . Microscopic hematuria 11/23/2015  . Mixed incontinence   . MS (multiple sclerosis) (HCC)   . Multiple sclerosis (HCC) 06/03/2015  . OAB (overactive bladder)   . Obesity   . Pulmonary embolism (HCC)   . Snores  Surgical History: Past Surgical History:  Procedure Laterality Date  . ABDOMINAL HYSTERECTOMY    . Bladder mesh    . CHOLECYSTECTOMY    . Chonca batosa    . DILATION AND CURETTAGE OF UTERUS    . LASER ABLATION CONDYLOMA CERVICAL / VULVAR      Home Medications:  Allergies as of 09/23/2016      Reactions   Cefaclor Other (See Comments)   Other reaction(s): Unknown   Penicillins    Other reaction(s): Unknown   Phenytoin    Other reaction(s): Unknown   Phenytoin Sodium Extended Other (See Comments)      Medication List       Accurate as of 09/23/16 11:59 PM. Always use your most recent med list.          [facility]S SALINE NASAL SPRAY 0.65 % nasal spray Generic drug:  sodium chloride 1 spray 2 (two) times daily.   fesoterodine 8 MG Tb24 tablet Commonly known as:  TOVIAZ Take 1 tablet (8 mg total) by mouth  daily.   fluticasone 50 MCG/ACT nasal spray Commonly known as:  FLONASE Place into the nose.   HYDROcodone-acetaminophen 5-325 MG tablet Commonly known as:  NORCO/VICODIN Take 1 tablet by mouth every 8 (eight) hours as needed. for pain   LYRICA 150 MG capsule Generic drug:  pregabalin Take 150 mg by mouth 2 (two) times daily.   Magnesium 250 MG Tabs Take by mouth.   meclizine 12.5 MG tablet Commonly known as:  ANTIVERT TAKE 1 TABLET BY MOUTH 3 TIMES A DAY AS NEEDED FOR DIZZINESS   niacin 100 MG tablet Take by mouth.   pantoprazole 40 MG tablet Commonly known as:  PROTONIX   Phendimetrazine Tartrate 105 MG Cp24 Reported on 11/19/2015   phentermine 37.5 MG tablet Commonly known as:  ADIPEX-P Reported on 11/19/2015   PROAIR HFA 108 (90 Base) MCG/ACT inhaler Generic drug:  albuterol Inhale into the lungs.   RA VITAMIN B-12 TR 1000 MCG Tbcr Generic drug:  Cyanocobalamin Take by mouth. Reported on 11/19/2015   sertraline 100 MG tablet Commonly known as:  ZOLOFT Take by mouth.   valACYclovir 500 MG tablet Commonly known as:  VALTREX Take by mouth.   Vitamin D3 2000 units capsule Take by mouth.   XARELTO 20 MG Tabs tablet Generic drug:  rivaroxaban Take 20 mg by mouth daily.       Allergies:  Allergies  Allergen Reactions  . Cefaclor Other (See Comments)    Other reaction(s): Unknown  . Penicillins     Other reaction(s): Unknown  . Phenytoin     Other reaction(s): Unknown  . Phenytoin Sodium Extended Other (See Comments)    Family History: Family History  Problem Relation Age of Onset  . Hypertension Mother   . Hypertension Father     Social History:  reports that she has been smoking Cigarettes.  She has been smoking about 0.50 packs per day. She has never used smokeless tobacco. She reports that she does not drink alcohol or use drugs.  ROS: UROLOGY Frequent Urination?: Yes Hard to postpone urination?: Yes Burning/pain with urination?: No Get  up at night to urinate?: No Leakage of urine?: No Urine stream starts and stops?: Yes Trouble starting stream?: No Do you have to strain to urinate?: No Blood in urine?: No Urinary tract infection?: No Sexually transmitted disease?: No Injury to kidneys or bladder?: No Painful intercourse?: No Weak stream?: No Currently pregnant?: No Vaginal bleeding?: No Last menstrual period?:  n  Gastrointestinal Nausea?: No Vomiting?: No Indigestion/heartburn?: No Diarrhea?: No Constipation?: No  Constitutional Fever: No Night sweats?: No Weight loss?: No Fatigue?: Yes  Skin Skin rash/lesions?: No Itching?: No  Eyes Blurred vision?: No Double vision?: No  Ears/Nose/Throat Sore throat?: Yes Sinus problems?: Yes  Hematologic/Lymphatic Swollen glands?: No Easy bruising?: Yes  Cardiovascular Leg swelling?: No Chest pain?: No  Respiratory Cough?: Yes Shortness of breath?: No  Endocrine Excessive thirst?: No  Musculoskeletal Back pain?: Yes Joint pain?: Yes  Neurological Headaches?: No Dizziness?: No  Psychologic Depression?: No Anxiety?: Yes   Physical Exam: BP 120/79   Pulse 94   Ht 5\' 3"  (1.6 m)   Wt 220 lb (99.8 kg)   BMI 38.97 kg/m   Constitutional: Well nourished. Alert and oriented, No acute distress. HEENT: [place] AT, moist mucus membranes. Trachea midline, no masses. Cardiovascular: No clubbing, cyanosis, or edema. Respiratory: Normal respiratory effort, no increased work of breathing. Skin: No rashes, bruises or suspicious lesions. Lymph: No cervical or inguinal adenopathy. Neurologic: Grossly intact, no focal deficits, moving all 4 extremities. Psychiatric: Normal mood and affect.  Laboratory Data: Lab Results  Component Value Date   WBC 6.6 04/12/2014   HGB 12.5 04/12/2014   HCT 37.0 04/12/2014   MCV 91 04/12/2014   PLT 278 04/12/2014    Lab Results  Component Value Date   CREATININE 0.74 04/12/2014    Lab Results  Component  Value Date   AST 21 03/30/2014   Lab Results  Component Value Date   ALT 26 03/30/2014    PTNS treatment: The needle electrode was inserted into the lower, inner aspect of the patient's left leg. The surface electrode was placed on the inside arch of the foot on the treatment leg. The lead set was connected to the stimulator and the needle electrode clip was connected to the needle electrode. The stimulator that produces an adjustable electrical pulse that travels to the sacral nerve plexus via the tibial nerve was increased to 1 until the patient received a toe flex and sensory response.      Assessment & Plan:    1. Urge incontinence  Treatment Plan:  The needle electrode was removed without difficulty to the patient.  Patient tolerated the procedure for 30 minutes.  She will return next week for # 4 out of 12 of their weekly PTNS treatment's   Return in about 1 week (around 09/30/2016) for # 4 PTNS.  These notes generated with voice recognition software. I apologize for typographical errors.  Michiel Cowboy, PA-C  [facility] [address], [address] [place], [place] [address] [phone]

## 2016-09-23 NOTE — Progress Notes (Signed)
PTNS  Session # 3  Health & Social Factors: 0 Caffeine: 3-4 Alcohol: 0 Daytime voids #per day: 5 Night-time voids #per night: 0-1 Urgency: strong Incontinence Episodes #per day: 0 Ankle used: left Treatment Setting: 1 Feeling/ Response: feeling   Preformed By: Rupert Stacks, LPN,, Juanell Fairly, [employer] student    Follow Up: 1 week

## 2016-09-29 NOTE — Progress Notes (Signed)
Chief Complaint:  Chief Complaint  Patient presents with  . PTNS    Urge Incontinence     HPI: 51 yo WF who presents today for her fourth of twelve weekly PTNS treatments.  Zehava Turski: Patient is a 51 year old Caucasian female with MS and a history of GU issues including urethral discharge/bleeding and OAB symptoms including mixed urinary incontinence who was given a trial Myrbetriq 25 mg daily who presents today for follow-up.  She had been on Vesicare 5 mg daily in the past with good symptom control, but started to lose its effectiveness and the dry mouth was becoming bothersome to her.  She states that she did not find the Myrbetriq samples beneficial. She felt that the samples did not curb her urinary urge incontinence. She felt the Vesicare provided more benefit.  Dr. Sherron Monday When I saw the patient in May she was much better on Toviaz 8 mg and had no microscopic hematuria.  The [name] is working but she still can leak when she goes from a sitting to standing position. Her frequency is stable. She does not wear pads but sometimes has to change her underclothes.  She understands that we've likely maximize medical therapy and I talked her about percutaneous tibial nerve stimulation with my usual template. She would like to try this therapy in combination with Toviaz. If she does beautifully the [name] could be stopped.  I will order a percutaneous tibial nerve stimulation and reassess her in 3 months  Baseline symptoms are 4 daytime voids, 1 night time voids and 0 incontinence over the last week.    Today, she is complaining of frequency, urgency and intermittency.  She is having 6 day time voids (worse), 0 night time void  (improved) and 0 incontinence episode daily (improved).   She is not having dysuria, gross hematuria and suprapubic pain.  She is not having fevers, chills, nausea and vomiting.             Previous Therapy:     Behavioral Modification Techniques   Pelvic Floor Exercises - insurance would not cover           Biofeedback - insurance would not cover           Bladder Training - insurance would not cover           Fluid Management - encourage water intake           Dietary Restrictions - encourage smoking sensation       Contraindications present for PTNS      Pacemaker - NO      Implantable defibrillator - NO      History of abnormal bleeding - NO      History of neuropathies or nerve damage - NO  Discussed with patient possible complications of procedure, such as discomfort, bleeding at insertion/stimulation site, procedure consent signed  Patient goals:     Reduced urgency  PMH: Past Medical History:  Diagnosis Date  . Cervical dysplasia   . COPD (chronic obstructive pulmonary disease) (HCC)   . Discharge from the vagina 11/19/2014  . Fibromyalgia   . H/O: obesity 04/06/2014  . Hematuria   . Herpes, genital   . Microscopic hematuria 11/23/2015  . Mixed incontinence   . MS (multiple sclerosis) (HCC)   . Multiple sclerosis (HCC) 06/03/2015  . OAB (overactive bladder)   . Obesity   . Pulmonary embolism (HCC)   . Snores     Surgical History: Past Surgical History:  Procedure Laterality Date  . ABDOMINAL HYSTERECTOMY    . Bladder mesh    . CHOLECYSTECTOMY    . Chonca batosa    . DILATION AND CURETTAGE OF UTERUS    . LASER ABLATION CONDYLOMA CERVICAL / VULVAR      Home Medications:  Allergies as of 09/30/2016      Reactions   Cefaclor Other (See Comments)   Other reaction(s): Unknown   Penicillins    Other reaction(s): Unknown   Phenytoin    Other reaction(s): Unknown   Phenytoin Sodium Extended Other (See Comments)      Medication List       Accurate as of 09/30/16 10:53 AM. Always use your most recent med list.          [facility] SALINE NASAL SPRAY 0.65 % nasal spray Generic drug:  sodium chloride 1 spray 2 (two) times daily.   fesoterodine 8 MG Tb24 tablet Commonly known as:  TOVIAZ Take 1 tablet (8 mg  total) by mouth daily.   fluticasone 50 MCG/ACT nasal spray Commonly known as:  FLONASE Place into the nose.   HYDROcodone-acetaminophen 5-325 MG tablet Commonly known as:  NORCO/VICODIN Take 1 tablet by mouth every 8 (eight) hours as needed. for pain   LYRICA 150 MG capsule Generic drug:  pregabalin Take 150 mg by mouth 2 (two) times daily.   Magnesium 250 MG Tabs Take by mouth.   meclizine 12.5 MG tablet Commonly known as:  ANTIVERT TAKE 1 TABLET BY MOUTH 3 TIMES A DAY AS NEEDED FOR DIZZINESS   niacin 100 MG tablet Take by mouth.   pantoprazole 40 MG tablet Commonly known as:  PROTONIX   Phendimetrazine Tartrate 105 MG Cp24 Reported on 11/19/2015   phentermine 37.5 MG tablet Commonly known as:  ADIPEX-P Reported on 11/19/2015   PROAIR HFA 108 (90 Base) MCG/ACT inhaler Generic drug:  albuterol Inhale into the lungs.   RA VITAMIN B-12 TR 1000 MCG Tbcr Generic drug:  Cyanocobalamin Take by mouth. Reported on 11/19/2015   sertraline 100 MG tablet Commonly known as:  ZOLOFT Take by mouth.   valACYclovir 500 MG tablet Commonly known as:  VALTREX Take by mouth.   Vitamin D3 2000 units capsule Take by mouth.   XARELTO 20 MG Tabs tablet Generic drug:  rivaroxaban Take 20 mg by mouth daily.       Allergies:  Allergies  Allergen Reactions  . Cefaclor Other (See Comments)    Other reaction(s): Unknown  . Penicillins     Other reaction(s): Unknown  . Phenytoin     Other reaction(s): Unknown  . Phenytoin Sodium Extended Other (See Comments)    Family History: Family History  Problem Relation Age of Onset  . Hypertension Mother   . Hypertension Father   . Prostate cancer Paternal Uncle   . Kidney cancer Neg Hx   . Bladder Cancer Neg Hx     Social History:  reports that she has been smoking Cigarettes.  She has been smoking about 0.50 packs per day. She has never used smokeless tobacco. She reports that she does not drink alcohol or use  drugs.  ROS: UROLOGY Frequent Urination?: Yes Hard to postpone urination?: Yes Burning/pain with urination?: No Get up at night to urinate?: No Leakage of urine?: No Urine stream starts and stops?: Yes Trouble starting stream?: No Do you have to strain to urinate?: No Blood in urine?: No Urinary tract infection?: No Sexually transmitted disease?: No Injury to kidneys or bladder?: No  Painful intercourse?: No Weak stream?: No Currently pregnant?: No Vaginal bleeding?: No Last menstrual period?: n  Gastrointestinal Nausea?: No Vomiting?: No Indigestion/heartburn?: No Diarrhea?: No Constipation?: No  Constitutional Fever: No Night sweats?: No Weight loss?: No Fatigue?: Yes  Skin Skin rash/lesions?: No Itching?: No  Eyes Blurred vision?: No Double vision?: No  Ears/Nose/Throat Sore throat?: Yes Sinus problems?: Yes  Hematologic/Lymphatic Swollen glands?: No Easy bruising?: Yes  Cardiovascular Leg swelling?: No Chest pain?: No  Respiratory Cough?: Yes Shortness of breath?: No  Endocrine Excessive thirst?: No  Musculoskeletal Back pain?: Yes Joint pain?: Yes  Neurological Headaches?: No Dizziness?: No  Psychologic Depression?: No Anxiety?: Yes   Physical Exam: BP 124/75   Pulse 86   Ht 5\' 3"  (1.6 m)   Wt 229 lb 6.4 oz (104.1 kg)   BMI 40.64 kg/m   Constitutional: Well nourished. Alert and oriented, No acute distress. HEENT: [place] AT, moist mucus membranes. Trachea midline, no masses. Cardiovascular: No clubbing, cyanosis, or edema. Respiratory: Normal respiratory effort, no increased work of breathing. Skin: No rashes, bruises or suspicious lesions. Lymph: No cervical or inguinal adenopathy. Neurologic: Grossly intact, no focal deficits, moving all 4 extremities. Psychiatric: Normal mood and affect.  Laboratory Data: Lab Results  Component Value Date   WBC 6.6 04/12/2014   HGB 12.5 04/12/2014   HCT 37.0 04/12/2014   MCV 91  04/12/2014   PLT 278 04/12/2014    Lab Results  Component Value Date   CREATININE 0.74 04/12/2014    Lab Results  Component Value Date   AST 21 03/30/2014   Lab Results  Component Value Date   ALT 26 03/30/2014    PTNS treatment: The needle electrode was inserted into the lower, inner aspect of the patient's left leg. The surface electrode was placed on the inside arch of the foot on the treatment leg. The lead set was connected to the stimulator and the needle electrode clip was connected to the needle electrode. The stimulator that produces an adjustable electrical pulse that travels to the sacral nerve plexus via the tibial nerve was increased to  until the patient received a toe flex and sensory response.      Assessment & Plan:    1. Urge incontinence  Treatment Plan:  The needle electrode was removed without difficulty to the patient.  Patient tolerated the procedure for 30 minutes.  She will return next week for # 5 out of 12 of their weekly PTNS treatment's   Return in about 1 week (around 10/07/2016) for # 5 PTNS.  These notes generated with voice recognition software. I apologize for typographical errors.  Michiel Cowboy, PA-C  [facility] [address] [place], [place] [address] [phone]

## 2016-09-30 ENCOUNTER — Encounter: Payer: Self-pay | Admitting: Urology

## 2016-09-30 ENCOUNTER — Ambulatory Visit (INDEPENDENT_AMBULATORY_CARE_PROVIDER_SITE_OTHER): Payer: Medicare HMO | Admitting: Urology

## 2016-09-30 VITALS — BP 124/75 | HR 86 | Ht 63.0 in | Wt 229.4 lb

## 2016-09-30 DIAGNOSIS — N3941 Urge incontinence: Secondary | ICD-10-CM

## 2016-09-30 LAB — PTNS-PERCUTANEOUS TIBIAL NERVE STIMULATION: SCAN RESULT: 5

## 2016-10-06 NOTE — Progress Notes (Signed)
Chief Complaint:  Chief Complaint  Patient presents with  . PTNS    urge incontinence     HPI: 51 yo WF who presents today for her fifth of twelve weekly PTNS treatments.  Tracey Chandler: Patient is a 51 year old Caucasian female with MS and a history of GU issues including urethral discharge/bleeding and OAB symptoms including mixed urinary incontinence who was given a trial Myrbetriq 25 mg daily who presents today for follow-up.  She had been on Vesicare 5 mg daily in the past with good symptom control, but started to lose its effectiveness and the dry mouth was becoming bothersome to her.  She states that she did not find the Myrbetriq samples beneficial. She felt that the samples did not curb her urinary urge incontinence. She felt the Vesicare provided more benefit.  Dr. Sherron Monday When I saw the patient in May she was much better on Toviaz 8 mg and had no microscopic hematuria.  The [name] is working but she still can leak when she goes from a sitting to standing position. Her frequency is stable. She does not wear pads but sometimes has to change her underclothes.  She understands that we've likely maximize medical therapy and I talked her about percutaneous tibial nerve stimulation with my usual template. She would like to try this therapy in combination with Toviaz. If she does beautifully the [name] could be stopped.  I will order a percutaneous tibial nerve stimulation and reassess her in 3 months  Baseline symptoms are 4 daytime voids, 1 night time voids and 0 incontinence over the last week.    Today, she is complaining of frequency, urgency and intermittency.    She is having 7 day time voids (worseing), 1 night time void  (stable) and 1 incontinence episode daily (worsening).   She is not having dysuria, gross hematuria and suprapubic pain.  She is not having fevers, chills, nausea and vomiting.             Previous Therapy:     Behavioral Modification Techniques     Pelvic Floor Exercises - insurance would not cover           Biofeedback - insurance would not cover           Bladder Training - insurance would not cover           Fluid Management - encourage water intake           Dietary Restrictions - encourage smoking sensation       Contraindications present for PTNS      Pacemaker - NO      Implantable defibrillator - NO      History of abnormal bleeding - NO      History of neuropathies or nerve damage - NO  Discussed with patient possible complications of procedure, such as discomfort, bleeding at insertion/stimulation site, procedure consent signed  Patient goals:     Reduced urgency  PMH: Past Medical History:  Diagnosis Date  . Cervical dysplasia   . COPD (chronic obstructive pulmonary disease) (HCC)   . Discharge from the vagina 11/19/2014  . Fibromyalgia   . H/O: obesity 04/06/2014  . Hematuria   . Herpes, genital   . Microscopic hematuria 11/23/2015  . Mixed incontinence   . MS (multiple sclerosis) (HCC)   . Multiple sclerosis (HCC) 06/03/2015  . OAB (overactive bladder)   . Obesity   . Pulmonary embolism (HCC)   . Snores     Surgical  History: Past Surgical History:  Procedure Laterality Date  . ABDOMINAL HYSTERECTOMY    . Bladder mesh    . CHOLECYSTECTOMY    . Chonca batosa    . DILATION AND CURETTAGE OF UTERUS    . LASER ABLATION CONDYLOMA CERVICAL / VULVAR      Home Medications:  Allergies as of 10/07/2016      Reactions   Cefaclor Other (See Comments)   Other reaction(s): Unknown   Penicillins    Other reaction(s): Unknown   Phenytoin    Other reaction(s): Unknown   Phenytoin Sodium Extended Other (See Comments)      Medication List       Accurate as of 10/07/16 11:08 AM. Always use your most recent med list.          [facility] SALINE NASAL SPRAY 0.65 % nasal spray Generic drug:  sodium chloride 1 spray 2 (two) times daily.   fesoterodine 8 MG Tb24 tablet Commonly known as:  TOVIAZ Take 1 tablet (8  mg total) by mouth daily.   fluticasone 50 MCG/ACT nasal spray Commonly known as:  FLONASE Place into the nose.   HYDROcodone-acetaminophen 5-325 MG tablet Commonly known as:  NORCO/VICODIN Take 1 tablet by mouth every 8 (eight) hours as needed. for pain   LYRICA 150 MG capsule Generic drug:  pregabalin Take 150 mg by mouth 2 (two) times daily.   Magnesium 250 MG Tabs Take by mouth.   meclizine 12.5 MG tablet Commonly known as:  ANTIVERT TAKE 1 TABLET BY MOUTH 3 TIMES A DAY AS NEEDED FOR DIZZINESS   niacin 100 MG tablet Take by mouth.   pantoprazole 40 MG tablet Commonly known as:  PROTONIX   Phendimetrazine Tartrate 105 MG Cp24 Reported on 11/19/2015   phentermine 37.5 MG tablet Commonly known as:  ADIPEX-P Reported on 11/19/2015   PROAIR HFA 108 (90 Base) MCG/ACT inhaler Generic drug:  albuterol Inhale into the lungs.   RA VITAMIN B-12 TR 1000 MCG Tbcr Generic drug:  Cyanocobalamin Take by mouth. Reported on 11/19/2015   sertraline 100 MG tablet Commonly known as:  ZOLOFT Take by mouth.   valACYclovir 500 MG tablet Commonly known as:  VALTREX Take by mouth.   Vitamin D3 2000 units capsule Take by mouth.   XARELTO 20 MG Tabs tablet Generic drug:  rivaroxaban Take 20 mg by mouth daily.       Allergies:  Allergies  Allergen Reactions  . Cefaclor Other (See Comments)    Other reaction(s): Unknown  . Penicillins     Other reaction(s): Unknown  . Phenytoin     Other reaction(s): Unknown  . Phenytoin Sodium Extended Other (See Comments)    Family History: Family History  Problem Relation Age of Onset  . Hypertension Mother   . Hypertension Father   . Prostate cancer Paternal Uncle   . Kidney cancer Neg Hx   . Bladder Cancer Neg Hx     Social History:  reports that she has been smoking Cigarettes.  She has been smoking about 0.50 packs per day. She has never used smokeless tobacco. She reports that she does not drink alcohol or use  drugs.  ROS: UROLOGY Frequent Urination?: Yes Hard to postpone urination?: Yes Burning/pain with urination?: No Get up at night to urinate?: No Leakage of urine?: No Urine stream starts and stops?: Yes Trouble starting stream?: No Do you have to strain to urinate?: No Blood in urine?: No Urinary tract infection?: No Sexually transmitted disease?: No Injury  to kidneys or bladder?: No Painful intercourse?: No Weak stream?: No Currently pregnant?: No Vaginal bleeding?: No Last menstrual period?: n  Gastrointestinal Nausea?: No Vomiting?: No Indigestion/heartburn?: No Diarrhea?: No Constipation?: No  Constitutional Fever: No Night sweats?: No Weight loss?: No Fatigue?: No  Skin Skin rash/lesions?: No Itching?: No  Eyes Blurred vision?: No Double vision?: No  Ears/Nose/Throat Sore throat?: No Sinus problems?: Yes  Hematologic/Lymphatic Swollen glands?: No Easy bruising?: Yes  Cardiovascular Leg swelling?: No Chest pain?: No  Respiratory Cough?: Yes Shortness of breath?: No  Endocrine Excessive thirst?: No  Musculoskeletal Back pain?: Yes Joint pain?: Yes  Neurological Headaches?: No Dizziness?: No  Psychologic Depression?: No Anxiety?: Yes   Physical Exam: BP 108/73   Pulse 87   Ht 5\' 3"  (1.6 m)   Wt 230 lb (104.3 kg)   BMI 40.74 kg/m   Constitutional: Well nourished. Alert and oriented, No acute distress. HEENT: [place] AT, moist mucus membranes. Trachea midline, no masses. Cardiovascular: No clubbing, cyanosis, or edema. Respiratory: Normal respiratory effort, no increased work of breathing. Skin: No rashes, bruises or suspicious lesions. Lymph: No cervical or inguinal adenopathy. Neurologic: Grossly intact, no focal deficits, moving all 4 extremities. Psychiatric: Normal mood and affect.  Laboratory Data: Lab Results  Component Value Date   WBC 6.6 04/12/2014   HGB 12.5 04/12/2014   HCT 37.0 04/12/2014   MCV 91 04/12/2014    PLT 278 04/12/2014    Lab Results  Component Value Date   CREATININE 0.74 04/12/2014    Lab Results  Component Value Date   AST 21 03/30/2014   Lab Results  Component Value Date   ALT 26 03/30/2014    PTNS treatment: The needle electrode was inserted into the lower, inner aspect of the patient's left leg. The surface electrode was placed on the inside arch of the foot on the treatment leg. The lead set was connected to the stimulator and the needle electrode clip was connected to the needle electrode. The stimulator that produces an adjustable electrical pulse that travels to the sacral nerve plexus via the tibial nerve was increased to 7 until the patient received a sensory response and      Assessment & Plan:    1. Urge incontinence  Treatment Plan:  The needle electrode was removed without difficulty to the patient.  Patient tolerated the procedure for 30 minutes.  She will return next week for # 6 out of 12 of their weekly PTNS treatment's   Return for # 6 PTNS.  These notes generated with voice recognition software. I apologize for typographical errors.  Michiel Cowboy, PA-C  [facility] [address] [place], [place] [address] [phone]

## 2016-10-07 ENCOUNTER — Encounter: Payer: Self-pay | Admitting: Urology

## 2016-10-07 ENCOUNTER — Ambulatory Visit (INDEPENDENT_AMBULATORY_CARE_PROVIDER_SITE_OTHER): Payer: Medicare HMO | Admitting: Urology

## 2016-10-07 VITALS — BP 108/73 | HR 87 | Ht 63.0 in | Wt 230.0 lb

## 2016-10-07 DIAGNOSIS — N3941 Urge incontinence: Secondary | ICD-10-CM | POA: Diagnosis not present

## 2016-10-07 LAB — PTNS-PERCUTANEOUS TIBIAL NERVE STIMULATION: Scan Result: 7

## 2016-10-13 NOTE — Progress Notes (Signed)
Chief Complaint:  Chief Complaint  Patient presents with  . PTNS    Urge incontinence     HPI: 51 yo WF who presents today for her 6th of twelve weekly PTNS treatments.  Kaizer Dissinger: Patient is a 51 year old Caucasian female with MS and a history of GU issues including urethral discharge/bleeding and OAB symptoms including mixed urinary incontinence who was given a trial Myrbetriq 25 mg daily who presents today for follow-up.  She had been on Vesicare 5 mg daily in the past with good symptom control, but started to lose its effectiveness and the dry mouth was becoming bothersome to her.  She states that she did not find the Myrbetriq samples beneficial. She felt that the samples did not curb her urinary urge incontinence. She felt the Vesicare provided more benefit.  Dr. Sherron Monday When I saw the patient in May she was much better on Toviaz 8 mg and had no microscopic hematuria.  The [name] is working but she still can leak when she goes from a sitting to standing position. Her frequency is stable. She does not wear pads but sometimes has to change her underclothes.  She understands that we've likely maximize medical therapy and I talked her about percutaneous tibial nerve stimulation with my usual template. She would like to try this therapy in combination with Toviaz. If she does beautifully the [name] could be stopped.  I will order a percutaneous tibial nerve stimulation and reassess her in 3 months  Baseline symptoms are 4 daytime voids, 1 night time voids and 0 incontinence over the last week.    Today, she is complaining of frequency, urgency and intermittency.    She is having 6 day time voids (worsening),  1 night time void  (stable) and 1 incontinence episode daily (stable).   She is not having dysuria, gross hematuria and suprapubic pain.  She is not having fevers, chills, nausea and vomiting.             Previous Therapy:     Behavioral Modification Techniques   Pelvic Floor Exercises - insurance would not cover           Biofeedback - insurance would not cover           Bladder Training - insurance would not cover           Fluid Management - encourage water intake           Dietary Restrictions - encourage smoking sensation       Contraindications present for PTNS      Pacemaker - NO      Implantable defibrillator - NO      History of abnormal bleeding - NO      History of neuropathies or nerve damage - NO  Discussed with patient possible complications of procedure, such as discomfort, bleeding at insertion/stimulation site, procedure consent signed  Patient goals:     Reduced urgency  PMH: Past Medical History:  Diagnosis Date  . Cervical dysplasia   . COPD (chronic obstructive pulmonary disease) (HCC)   . Discharge from the vagina 11/19/2014  . Fibromyalgia   . H/O: obesity 04/06/2014  . Hematuria   . Herpes, genital   . Microscopic hematuria 11/23/2015  . Mixed incontinence   . MS (multiple sclerosis) (HCC)   . Multiple sclerosis (HCC) 06/03/2015  . OAB (overactive bladder)   . Obesity   . Pulmonary embolism (HCC)   . Snores     Surgical History:  Past Surgical History:  Procedure Laterality Date  . ABDOMINAL HYSTERECTOMY    . Bladder mesh    . CHOLECYSTECTOMY    . Chonca batosa    . DILATION AND CURETTAGE OF UTERUS    . LASER ABLATION CONDYLOMA CERVICAL / VULVAR      Home Medications:  Allergies as of 10/14/2016      Reactions   Cefaclor Other (See Comments)   Other reaction(s): Unknown   Penicillins    Other reaction(s): Unknown   Phenytoin    Other reaction(s): Unknown   Phenytoin Sodium Extended Other (See Comments)      Medication List       Accurate as of 10/14/16 10:57 AM. Always use your most recent med list.          [facility] SALINE NASAL SPRAY 0.65 % nasal spray Generic drug:  sodium chloride 1 spray 2 (two) times daily.   fesoterodine 8 MG Tb24 tablet Commonly known as:  TOVIAZ Take 1 tablet (8 mg  total) by mouth daily.   fluticasone 50 MCG/ACT nasal spray Commonly known as:  FLONASE Place into the nose.   HYDROcodone-acetaminophen 5-325 MG tablet Commonly known as:  NORCO/VICODIN Take 1 tablet by mouth every 8 (eight) hours as needed. for pain   LYRICA 150 MG capsule Generic drug:  pregabalin Take 150 mg by mouth 2 (two) times daily.   Magnesium 250 MG Tabs Take by mouth.   meclizine 12.5 MG tablet Commonly known as:  ANTIVERT TAKE 1 TABLET BY MOUTH 3 TIMES A DAY AS NEEDED FOR DIZZINESS   niacin 100 MG tablet Take by mouth.   pantoprazole 40 MG tablet Commonly known as:  PROTONIX   Phendimetrazine Tartrate 105 MG Cp24 Reported on 11/19/2015   phentermine 37.5 MG tablet Commonly known as:  ADIPEX-P Reported on 11/19/2015   PROAIR HFA 108 (90 Base) MCG/ACT inhaler Generic drug:  albuterol Inhale into the lungs.   RA VITAMIN B-12 TR 1000 MCG Tbcr Generic drug:  Cyanocobalamin Take by mouth. Reported on 11/19/2015   sertraline 100 MG tablet Commonly known as:  ZOLOFT Take by mouth.   valACYclovir 500 MG tablet Commonly known as:  VALTREX Take by mouth.   Vitamin D3 2000 units capsule Take by mouth.   XARELTO 20 MG Tabs tablet Generic drug:  rivaroxaban Take 20 mg by mouth daily.       Allergies:  Allergies  Allergen Reactions  . Cefaclor Other (See Comments)    Other reaction(s): Unknown  . Penicillins     Other reaction(s): Unknown  . Phenytoin     Other reaction(s): Unknown  . Phenytoin Sodium Extended Other (See Comments)    Family History: Family History  Problem Relation Age of Onset  . Hypertension Mother   . Hypertension Father   . Prostate cancer Paternal Uncle   . Kidney cancer Neg Hx   . Bladder Cancer Neg Hx     Social History:  reports that she has been smoking Cigarettes.  She has been smoking about 0.50 packs per day. She has never used smokeless tobacco. She reports that she does not drink alcohol or use  drugs.  ROS: UROLOGY Frequent Urination?: Yes Hard to postpone urination?: Yes Burning/pain with urination?: No Get up at night to urinate?: No Leakage of urine?: No Urine stream starts and stops?: Yes Trouble starting stream?: No Do you have to strain to urinate?: No Blood in urine?: No Urinary tract infection?: No Sexually transmitted disease?: No Injury to  kidneys or bladder?: No Painful intercourse?: No Weak stream?: No Currently pregnant?: No Vaginal bleeding?: No Last menstrual period?: n  Gastrointestinal Nausea?: No Vomiting?: No Indigestion/heartburn?: No Diarrhea?: No Constipation?: No  Constitutional Fever: No Night sweats?: No Weight loss?: No Fatigue?: No  Skin Skin rash/lesions?: No Itching?: No  Eyes Blurred vision?: No Double vision?: No  Ears/Nose/Throat Sore throat?: No Sinus problems?: No  Hematologic/Lymphatic Swollen glands?: No Easy bruising?: No  Cardiovascular Leg swelling?: No Chest pain?: No  Respiratory Cough?: Yes Shortness of breath?: No  Endocrine Excessive thirst?: No  Musculoskeletal Back pain?: No Joint pain?: Yes  Neurological Headaches?: No Dizziness?: No  Psychologic Depression?: No Anxiety?: Yes   Physical Exam: BP 120/87   Pulse 85   Ht 5\' 4"  (1.626 m)   Wt 229 lb 4.8 oz (104 kg)   BMI 39.36 kg/m   Constitutional: Well nourished. Alert and oriented, No acute distress. HEENT: [place] AT, moist mucus membranes. Trachea midline, no masses. Cardiovascular: No clubbing, cyanosis, or edema. Respiratory: Normal respiratory effort, no increased work of breathing. Skin: No rashes, bruises or suspicious lesions. Lymph: No cervical or inguinal adenopathy. Neurologic: Grossly intact, no focal deficits, moving all 4 extremities. Psychiatric: Normal mood and affect.  Laboratory Data: Lab Results  Component Value Date   WBC 6.6 04/12/2014   HGB 12.5 04/12/2014   HCT 37.0 04/12/2014   MCV 91 04/12/2014    PLT 278 04/12/2014    Lab Results  Component Value Date   CREATININE 0.74 04/12/2014    Lab Results  Component Value Date   AST 21 03/30/2014   Lab Results  Component Value Date   ALT 26 03/30/2014    PTNS treatment: The needle electrode was inserted into the lower, inner aspect of the patient's right leg. The surface electrode was placed on the inside arch of the foot on the treatment leg. The lead set was connected to the stimulator and the needle electrode clip was connected to the needle electrode. The stimulator that produces an adjustable electrical pulse that travels to the sacral nerve plexus via the tibial nerve was increased to  4 until the patient received a toe flex and sensory response.     Assessment & Plan:    1. Urge incontinence  Treatment Plan:  The needle electrode was removed without difficulty to the patient.  Patient tolerated the procedure for 30 minutes.  She will return next week for # 7 out of 12 of their weekly PTNS treatment's    Return in about 1 week (around 10/21/2016) for # 7 PTNS.  These notes generated with voice recognition software. I apologize for typographical errors.  Michiel Cowboy, PA-C  [facility] [address] [place], [place] [address] [phone]

## 2016-10-14 ENCOUNTER — Encounter: Payer: Self-pay | Admitting: Urology

## 2016-10-14 ENCOUNTER — Ambulatory Visit (INDEPENDENT_AMBULATORY_CARE_PROVIDER_SITE_OTHER): Payer: Medicare HMO | Admitting: Urology

## 2016-10-14 VITALS — BP 120/87 | HR 85 | Ht 64.0 in | Wt 229.3 lb

## 2016-10-14 DIAGNOSIS — N3941 Urge incontinence: Secondary | ICD-10-CM | POA: Diagnosis not present

## 2016-10-14 LAB — PTNS-PERCUTANEOUS TIBIAL NERVE STIMULATION: Scan Result: 4

## 2016-10-20 NOTE — Progress Notes (Signed)
Chief Complaint:  Chief Complaint  Patient presents with  . PTNS    Urge incontinence     HPI: 51 yo WF who presents today for her 7th of twelve weekly PTNS treatments.  Tracey Chandler: Patient is a 51 year old Caucasian female with MS and a history of GU issues including urethral discharge/bleeding and OAB symptoms including mixed urinary incontinence who was given a trial Myrbetriq 25 mg daily who presents today for follow-up.  She had been on Vesicare 5 mg daily in the past with good symptom control, but started to lose its effectiveness and the dry mouth was becoming bothersome to her.  She states that she did not find the Myrbetriq samples beneficial. She felt that the samples did not curb her urinary urge incontinence. She felt the Vesicare provided more benefit.  Dr. Sherron Monday When I saw the patient in May she was much better on Toviaz 8 mg and had no microscopic hematuria.  The [name] is working but she still can leak when she goes from a sitting to standing position. Her frequency is stable. She does not wear pads but sometimes has to change her underclothes.  She understands that we've likely maximize medical therapy and I talked her about percutaneous tibial nerve stimulation with my usual template. She would like to try this therapy in combination with Toviaz. If she does beautifully the [name] could be stopped.  I will order a percutaneous tibial nerve stimulation and reassess her in 3 months  Baseline symptoms are 4 daytime voids, 1 night time voids and 0 incontinence over the last week.    Today, she is complaining of frequency, urgency and intermittency.    She is having 6 day time voids (worse),  0-1 night time void  (stable) and 0 incontinence episode daily (stable).   She is not having dysuria, gross hematuria and suprapubic pain.  She is not having fevers, chills, nausea and vomiting.             Previous Therapy:     Behavioral Modification Techniques   Pelvic Floor Exercises - insurance would not cover           Biofeedback - insurance would not cover           Bladder Training - insurance would not cover           Fluid Management - encourage water intake           Dietary Restrictions - encourage smoking sensation       Contraindications present for PTNS      Pacemaker - NO      Implantable defibrillator - NO      History of abnormal bleeding - NO      History of neuropathies or nerve damage - NO  Discussed with patient possible complications of procedure, such as discomfort, bleeding at insertion/stimulation site, procedure consent signed  Patient goals:     Reduced urgency  PMH: Past Medical History:  Diagnosis Date  . Cervical dysplasia   . COPD (chronic obstructive pulmonary disease) (HCC)   . Discharge from the vagina 11/19/2014  . Fibromyalgia   . H/O: obesity 04/06/2014  . Hematuria   . Herpes, genital   . Microscopic hematuria 11/23/2015  . Mixed incontinence   . MS (multiple sclerosis) (HCC)   . Multiple sclerosis (HCC) 06/03/2015  . OAB (overactive bladder)   . Obesity   . Pulmonary embolism (HCC)   . Snores     Surgical History:  Past Surgical History:  Procedure Laterality Date  . ABDOMINAL HYSTERECTOMY    . Bladder mesh    . CHOLECYSTECTOMY    . Chonca batosa    . DILATION AND CURETTAGE OF UTERUS    . LASER ABLATION CONDYLOMA CERVICAL / VULVAR      Home Medications:  Allergies as of 10/21/2016      Reactions   Cefaclor Other (See Comments)   Other reaction(s): Unknown   Penicillins    Other reaction(s): Unknown   Phenytoin    Other reaction(s): Unknown   Phenytoin Sodium Extended Other (See Comments)      Medication List       Accurate as of 10/21/16 10:43 AM. Always use your most recent med list.          [facility] SALINE NASAL SPRAY 0.65 % nasal spray Generic drug:  sodium chloride 1 spray 2 (two) times daily.   fesoterodine 8 MG Tb24 tablet Commonly known as:  TOVIAZ Take 1 tablet (8 mg  total) by mouth daily.   fluticasone 50 MCG/ACT nasal spray Commonly known as:  FLONASE Place into the nose.   HYDROcodone-acetaminophen 5-325 MG tablet Commonly known as:  NORCO/VICODIN Take 1 tablet by mouth every 8 (eight) hours as needed. for pain   LYRICA 150 MG capsule Generic drug:  pregabalin Take 150 mg by mouth 2 (two) times daily.   Magnesium 250 MG Tabs Take by mouth.   meclizine 12.5 MG tablet Commonly known as:  ANTIVERT TAKE 1 TABLET BY MOUTH 3 TIMES A DAY AS NEEDED FOR DIZZINESS   niacin 100 MG tablet Take by mouth.   pantoprazole 40 MG tablet Commonly known as:  PROTONIX   Phendimetrazine Tartrate 105 MG Cp24 Reported on 11/19/2015   phentermine 37.5 MG tablet Commonly known as:  ADIPEX-P Reported on 11/19/2015   PROAIR HFA 108 (90 Base) MCG/ACT inhaler Generic drug:  albuterol Inhale into the lungs.   RA VITAMIN B-12 TR 1000 MCG Tbcr Generic drug:  Cyanocobalamin Take by mouth. Reported on 11/19/2015   sertraline 100 MG tablet Commonly known as:  ZOLOFT Take by mouth.   valACYclovir 500 MG tablet Commonly known as:  VALTREX Take by mouth.   Vitamin D3 2000 units capsule Take by mouth.   XARELTO 20 MG Tabs tablet Generic drug:  rivaroxaban Take 20 mg by mouth daily.       Allergies:  Allergies  Allergen Reactions  . Cefaclor Other (See Comments)    Other reaction(s): Unknown  . Penicillins     Other reaction(s): Unknown  . Phenytoin     Other reaction(s): Unknown  . Phenytoin Sodium Extended Other (See Comments)    Family History: Family History  Problem Relation Age of Onset  . Hypertension Mother   . Hypertension Father   . Prostate cancer Paternal Uncle   . Kidney cancer Neg Hx   . Bladder Cancer Neg Hx     Social History:  reports that she has been smoking Cigarettes.  She has been smoking about 0.50 packs per day. She has never used smokeless tobacco. She reports that she does not drink alcohol or use  drugs.  ROS: UROLOGY Frequent Urination?: Yes Hard to postpone urination?: Yes Burning/pain with urination?: No Get up at night to urinate?: Yes Leakage of urine?: No Urine stream starts and stops?: Yes Trouble starting stream?: No Do you have to strain to urinate?: No Blood in urine?: No Urinary tract infection?: No Sexually transmitted disease?: No Injury to  kidneys or bladder?: No Painful intercourse?: No Weak stream?: No Currently pregnant?: No Vaginal bleeding?: No Last menstrual period?: n  Gastrointestinal Nausea?: No Vomiting?: No Indigestion/heartburn?: No Diarrhea?: No Constipation?: No  Constitutional Fever: No Night sweats?: No Weight loss?: No Fatigue?: No  Skin Skin rash/lesions?: No Itching?: No  Eyes Blurred vision?: No Double vision?: No  Ears/Nose/Throat Sore throat?: No Sinus problems?: No  Hematologic/Lymphatic Swollen glands?: No Easy bruising?: No  Cardiovascular Leg swelling?: No Chest pain?: No  Respiratory Cough?: No Shortness of breath?: No  Endocrine Excessive thirst?: No  Musculoskeletal Back pain?: No Joint pain?: No  Neurological Headaches?: No Dizziness?: No  Psychologic Depression?: No Anxiety?: No   Physical Exam: BP 135/85   Pulse 93   Ht 5\' 4"  (1.626 m)   Wt 230 lb 12.8 oz (104.7 kg)   BMI 39.62 kg/m   Constitutional: Well nourished. Alert and oriented, No acute distress. HEENT: [place] AT, moist mucus membranes. Trachea midline, no masses. Cardiovascular: No clubbing, cyanosis, or edema. Respiratory: Normal respiratory effort, no increased work of breathing. Skin: No rashes, bruises or suspicious lesions. Lymph: No cervical or inguinal adenopathy. Neurologic: Grossly intact, no focal deficits, moving all 4 extremities. Psychiatric: Normal mood and affect.  Laboratory Data: Lab Results  Component Value Date   WBC 6.6 04/12/2014   HGB 12.5 04/12/2014   HCT 37.0 04/12/2014   MCV 91  04/12/2014   PLT 278 04/12/2014    Lab Results  Component Value Date   CREATININE 0.74 04/12/2014    Lab Results  Component Value Date   AST 21 03/30/2014   Lab Results  Component Value Date   ALT 26 03/30/2014    PTNS treatment: The needle electrode was inserted into the lower, inner aspect of the patient's left leg. The surface electrode was placed on the inside arch of the foot on the treatment leg. The lead set was connected to the stimulator and the needle electrode clip was connected to the needle electrode. The stimulator that produces an adjustable electrical pulse that travels to the sacral nerve plexus via the tibial nerve was increased to 6 until the patient received a sensory and toe flex.     Assessment & Plan:    1. Urge incontinence  Treatment Plan:  The needle electrode was removed without difficulty to the patient.  Patient tolerated the procedure for 30 minutes.  She will return next week for # 8 out of 12 of their weekly PTNS treatment's    Return in about 1 week (around 10/28/2016) for # 8 PTNS.  These notes generated with voice recognition software. I apologize for typographical errors.  Michiel Cowboy, PA-C  [facility] [address] [place], [place] [address] [phone]

## 2016-10-21 ENCOUNTER — Ambulatory Visit (INDEPENDENT_AMBULATORY_CARE_PROVIDER_SITE_OTHER): Payer: Medicare HMO | Admitting: Urology

## 2016-10-21 ENCOUNTER — Encounter: Payer: Self-pay | Admitting: Urology

## 2016-10-21 VITALS — BP 135/85 | HR 93 | Ht 64.0 in | Wt 230.8 lb

## 2016-10-21 DIAGNOSIS — N3941 Urge incontinence: Secondary | ICD-10-CM

## 2016-10-21 LAB — PTNS-PERCUTANEOUS TIBIAL NERVE STIMULATION: SCAN RESULT: 6

## 2016-10-21 NOTE — Progress Notes (Signed)
PTNS  Session # 7  Health & Social Factors: No Change Caffeine: 4 Alcohol: 0 Daytime voids #per day: 6 Night-time voids #per night: 0-1 Urgency: Mild Incontinence Episodes #per day: 0 Ankle used: Left Treatment Setting: 6 Feeling/ Response: Both  Preformed By: Michiel Cowboy PA-C  Assistant: Dallas Schimke CMA  Follow Up: One week

## 2016-10-26 ENCOUNTER — Ambulatory Visit: Payer: Medicare HMO

## 2016-10-27 NOTE — Progress Notes (Signed)
Chief Complaint:  Chief Complaint  Patient presents with  . PTNS    Urge Incontinence     HPI: 51 yo WF who presents today for her 8th of twelve weekly PTNS treatments.  Tracey Chandler: Patient is a 51 year old Caucasian female with MS and a history of GU issues including urethral discharge/bleeding and OAB symptoms including mixed urinary incontinence who was given a trial Myrbetriq 25 mg daily who presents today for follow-up.  She had been on Vesicare 5 mg daily in the past with good symptom control, but started to lose its effectiveness and the dry mouth was becoming bothersome to her.  She states that she did not find the Myrbetriq samples beneficial. She felt that the samples did not curb her urinary urge incontinence. She felt the Vesicare provided more benefit.  Dr. Sherron Monday When I saw the patient in May she was much better on Toviaz 8 mg and had no microscopic hematuria.  The [name] is working but she still can leak when she goes from a sitting to standing position. Her frequency is stable. She does not wear pads but sometimes has to change her underclothes.  She understands that we've likely maximize medical therapy and I talked her about percutaneous tibial nerve stimulation with my usual template. She would like to try this therapy in combination with Toviaz. If she does beautifully the [name] could be stopped.  I will order a percutaneous tibial nerve stimulation and reassess her in 3 months  Baseline symptoms are 4 daytime voids, 1 night time voids and 0 incontinence.  Today, she is complaining of frequency, urgency and intermittency.    She is having 6 day time voids (stable), 1 night time void  (stable) and 0 incontinence episode daily (stable).   She is not having dysuria, gross hematuria and suprapubic pain.  She is not having fevers, chills, nausea and vomiting.             Previous Therapy:     Behavioral Modification Techniques            Pelvic Floor Exercises -  insurance would not cover           Biofeedback - insurance would not cover           Bladder Training - insurance would not cover           Fluid Management - encourage water intake           Dietary Restrictions - encourage smoking sensation       Contraindications present for PTNS      Pacemaker - NO      Implantable defibrillator - NO      History of abnormal bleeding - NO      History of neuropathies or nerve damage - NO  Discussed with patient possible complications of procedure, such as discomfort, bleeding at insertion/stimulation site, procedure consent signed  Patient goals:     Reduced urgency  PMH: Past Medical History:  Diagnosis Date  . Cervical dysplasia   . COPD (chronic obstructive pulmonary disease) (HCC)   . Discharge from the vagina 11/19/2014  . Fibromyalgia   . H/O: obesity 04/06/2014  . Hematuria   . Herpes, genital   . Microscopic hematuria 11/23/2015  . Mixed incontinence   . MS (multiple sclerosis) (HCC)   . Multiple sclerosis (HCC) 06/03/2015  . OAB (overactive bladder)   . Obesity   . Pulmonary embolism (HCC)   . Snores  Surgical History: Past Surgical History:  Procedure Laterality Date  . ABDOMINAL HYSTERECTOMY    . Bladder mesh    . CHOLECYSTECTOMY    . Chonca batosa    . DILATION AND CURETTAGE OF UTERUS    . LASER ABLATION CONDYLOMA CERVICAL / VULVAR      Home Medications:  Allergies as of 10/28/2016      Reactions   Cefaclor Other (See Comments)   Other reaction(s): Unknown   Penicillins    Other reaction(s): Unknown   Phenytoin    Other reaction(s): Unknown   Phenytoin Sodium Extended Other (See Comments)      Medication List       Accurate as of 10/28/16 11:12 AM. Always use your most recent med list.          [facility] SALINE NASAL SPRAY 0.65 % nasal spray Generic drug:  sodium chloride 1 spray 2 (two) times daily.   fesoterodine 8 MG Tb24 tablet Commonly known as:  TOVIAZ Take 1 tablet (8 mg total) by mouth daily.     fluticasone 50 MCG/ACT nasal spray Commonly known as:  FLONASE Place into the nose.   HYDROcodone-acetaminophen 5-325 MG tablet Commonly known as:  NORCO/VICODIN Take 1 tablet by mouth every 8 (eight) hours as needed. for pain   LYRICA 150 MG capsule Generic drug:  pregabalin Take 150 mg by mouth 2 (two) times daily.   Magnesium 250 MG Tabs Take by mouth.   meclizine 12.5 MG tablet Commonly known as:  ANTIVERT TAKE 1 TABLET BY MOUTH 3 TIMES A DAY AS NEEDED FOR DIZZINESS   niacin 100 MG tablet Take by mouth.   pantoprazole 40 MG tablet Commonly known as:  PROTONIX   Phendimetrazine Tartrate 105 MG Cp24 Reported on 11/19/2015   phentermine 37.5 MG tablet Commonly known as:  ADIPEX-P Reported on 11/19/2015   PROAIR HFA 108 (90 Base) MCG/ACT inhaler Generic drug:  albuterol Inhale into the lungs.   RA VITAMIN B-12 TR 1000 MCG Tbcr Generic drug:  Cyanocobalamin Take by mouth. Reported on 11/19/2015   sertraline 100 MG tablet Commonly known as:  ZOLOFT Take by mouth.   valACYclovir 500 MG tablet Commonly known as:  VALTREX Take by mouth.   Vitamin D3 2000 units capsule Take by mouth.   XARELTO 20 MG Tabs tablet Generic drug:  rivaroxaban Take 20 mg by mouth daily.       Allergies:  Allergies  Allergen Reactions  . Cefaclor Other (See Comments)    Other reaction(s): Unknown  . Penicillins     Other reaction(s): Unknown  . Phenytoin     Other reaction(s): Unknown  . Phenytoin Sodium Extended Other (See Comments)    Family History: Family History  Problem Relation Age of Onset  . Hypertension Mother   . Hypertension Father   . Prostate cancer Paternal Uncle   . Kidney cancer Neg Hx   . Bladder Cancer Neg Hx     Social History:  reports that she has been smoking Cigarettes.  She has been smoking about 0.50 packs per day. She has never used smokeless tobacco. She reports that she does not drink alcohol or use drugs.  ROS: UROLOGY Frequent  Urination?: Yes Hard to postpone urination?: Yes Burning/pain with urination?: No Get up at night to urinate?: No Leakage of urine?: No Urine stream starts and stops?: Yes Trouble starting stream?: No Do you have to strain to urinate?: No Blood in urine?: No Urinary tract infection?: No Sexually transmitted disease?:  No Injury to kidneys or bladder?: No Painful intercourse?: No Weak stream?: No Currently pregnant?: No Vaginal bleeding?: No Last menstrual period?: n  Gastrointestinal Nausea?: No Vomiting?: No Indigestion/heartburn?: No Diarrhea?: No Constipation?: No  Constitutional Fever: No Night sweats?: No Weight loss?: No Fatigue?: No  Skin Skin rash/lesions?: No Itching?: No  Eyes Blurred vision?: No Double vision?: No  Ears/Nose/Throat Sore throat?: No Sinus problems?: No  Hematologic/Lymphatic Swollen glands?: No Easy bruising?: No  Cardiovascular Leg swelling?: No Chest pain?: No  Respiratory Cough?: No Shortness of breath?: No  Endocrine Excessive thirst?: No  Musculoskeletal Back pain?: No Joint pain?: No  Neurological Headaches?: No Dizziness?: No  Psychologic Depression?: No Anxiety?: No   Physical Exam: BP 126/85   Pulse 79   Ht 5\' 3"  (1.6 m)   Wt 227 lb 9.6 oz (103.2 kg)   BMI 40.32 kg/m   Constitutional: Well nourished. Alert and oriented, No acute distress. HEENT: [place] AT, moist mucus membranes. Trachea midline, no masses. Cardiovascular: No clubbing, cyanosis, or edema. Respiratory: Normal respiratory effort, no increased work of breathing. Skin: No rashes, bruises or suspicious lesions. Lymph: No cervical or inguinal adenopathy. Neurologic: Grossly intact, no focal deficits, moving all 4 extremities. Psychiatric: Normal mood and affect.  Laboratory Data: Lab Results  Component Value Date   WBC 6.6 04/12/2014   HGB 12.5 04/12/2014   HCT 37.0 04/12/2014   MCV 91 04/12/2014   PLT 278 04/12/2014    Lab  Results  Component Value Date   CREATININE 0.74 04/12/2014    Lab Results  Component Value Date   AST 21 03/30/2014   Lab Results  Component Value Date   ALT 26 03/30/2014    PTNS treatment: The needle electrode was inserted into the lower, inner aspect of the patient's right leg. The surface electrode was placed on the inside arch of the foot on the treatment leg. The lead set was connected to the stimulator and the needle electrode clip was connected to the needle electrode. The stimulator that produces an adjustable electrical pulse that travels to the sacral nerve plexus via the tibial nerve was increased to 4 until the patient received a sensory respsonse.     Assessment & Plan:    1. Urge incontinence  Treatment Plan:  The needle electrode was removed without difficulty to the patient.  Patient tolerated the procedure for 30 minutes.  She will return next week for # 9 out of 12 of their weekly PTNS treatment's    Return in about 1 week (around 11/04/2016) for # 9 PTNS.  These notes generated with voice recognition software. I apologize for typographical errors.  Michiel Cowboy, PA-C  [facility] [address], [address] [place], [place] [address] [phone]

## 2016-10-28 ENCOUNTER — Ambulatory Visit (INDEPENDENT_AMBULATORY_CARE_PROVIDER_SITE_OTHER): Payer: Medicare HMO | Admitting: Urology

## 2016-10-28 ENCOUNTER — Encounter: Payer: Self-pay | Admitting: Urology

## 2016-10-28 VITALS — BP 126/85 | HR 79 | Ht 63.0 in | Wt 227.6 lb

## 2016-10-28 DIAGNOSIS — N3941 Urge incontinence: Secondary | ICD-10-CM

## 2016-10-28 LAB — PTNS-PERCUTANEOUS TIBIAL NERVE STIMULATION: SCAN RESULT: 4

## 2016-10-28 NOTE — Progress Notes (Addendum)
PTNS  Session # 8  Health & Social Factors: No Change  Caffeine: 4 Alcohol: 0 Daytime voids #per day: 6 Night-time voids #per night:1  Urgency: Mild Incontinence Episodes #per day:0  Ankle used: Right Treatment Setting: 4 Feeling/ Response: Sensory  Preformed By: Michiel Cowboy PA-C  Assistant: Dallas Schimke CMA  Follow Up: One week

## 2016-11-03 NOTE — Progress Notes (Signed)
Chief Complaint:  Chief Complaint  Patient presents with  . PTNS    Urge Incontinence     HPI: 51 yo WF who presents today for her 9th of twelve weekly PTNS treatments.  Jhania Etherington: Patient is a 51 year old Caucasian female with MS and a history of GU issues including urethral discharge/bleeding and OAB symptoms including mixed urinary incontinence who was given a trial Myrbetriq 25 mg daily who presents today for follow-up.  She had been on Vesicare 5 mg daily in the past with good symptom control, but started to lose its effectiveness and the dry mouth was becoming bothersome to her.  She states that she did not find the Myrbetriq samples beneficial. She felt that the samples did not curb her urinary urge incontinence. She felt the Vesicare provided more benefit.  Dr. Sherron Monday When I saw the patient in May she was much better on Toviaz 8 mg and had no microscopic hematuria.  The [name] is working but she still can leak when she goes from a sitting to standing position. Her frequency is stable. She does not wear pads but sometimes has to change her underclothes.  She understands that we've likely maximize medical therapy and I talked her about percutaneous tibial nerve stimulation with my usual template. She would like to try this therapy in combination with Toviaz. If she does beautifully the [name] could be stopped.  I will order a percutaneous tibial nerve stimulation and reassess her in 3 months  Baseline symptoms are 4 daytime voids, 1 night time voids and 0 incontinence.  Today, she is complaining of frequency, urgency and intermittency.    She is having 6 day time voids (worse), 0 night time void  (improved) and 0 incontinence episode daily (stable).   She is not having dysuria, gross hematuria and suprapubic pain.  She is not having fevers, chills, nausea and vomiting.             Previous Therapy:     Behavioral Modification Techniques            Pelvic Floor Exercises -  insurance would not cover           Biofeedback - insurance would not cover           Bladder Training - insurance would not cover           Fluid Management - encourage water intake           Dietary Restrictions - encourage smoking sensation       Contraindications present for PTNS      Pacemaker - NO      Implantable defibrillator - NO      History of abnormal bleeding - NO      History of neuropathies or nerve damage - NO  Discussed with patient possible complications of procedure, such as discomfort, bleeding at insertion/stimulation site, procedure consent signed  Patient goals:     Reduced urgency  PMH: Past Medical History:  Diagnosis Date  . Cervical dysplasia   . COPD (chronic obstructive pulmonary disease) (HCC)   . Discharge from the vagina 11/19/2014  . Fibromyalgia   . H/O: obesity 04/06/2014  . Hematuria   . Herpes, genital   . Microscopic hematuria 11/23/2015  . Mixed incontinence   . MS (multiple sclerosis) (HCC)   . Multiple sclerosis (HCC) 06/03/2015  . OAB (overactive bladder)   . Obesity   . Pulmonary embolism (HCC)   . Snores  Surgical History: Past Surgical History:  Procedure Laterality Date  . ABDOMINAL HYSTERECTOMY    . Bladder mesh    . CHOLECYSTECTOMY    . Chonca batosa    . DILATION AND CURETTAGE OF UTERUS    . LASER ABLATION CONDYLOMA CERVICAL / VULVAR      Home Medications:  Allergies as of 11/04/2016      Reactions   Cefaclor Other (See Comments)   Other reaction(s): Unknown   Penicillins    Other reaction(s): Unknown   Phenytoin    Other reaction(s): Unknown   Phenytoin Sodium Extended Other (See Comments)      Medication List       Accurate as of 11/04/16 11:29 AM. Always use your most recent med list.          [facility]S SALINE NASAL SPRAY 0.65 % nasal spray Generic drug:  sodium chloride 1 spray 2 (two) times daily.   fesoterodine 8 MG Tb24 tablet Commonly known as:  TOVIAZ Take 1 tablet (8 mg total) by mouth daily.     fluticasone 50 MCG/ACT nasal spray Commonly known as:  FLONASE Place into the nose.   HYDROcodone-acetaminophen 5-325 MG tablet Commonly known as:  NORCO/VICODIN Take 1 tablet by mouth every 8 (eight) hours as needed. for pain   LYRICA 150 MG capsule Generic drug:  pregabalin Take 150 mg by mouth 2 (two) times daily.   Magnesium 250 MG Tabs Take by mouth.   meclizine 12.5 MG tablet Commonly known as:  ANTIVERT TAKE 1 TABLET BY MOUTH 3 TIMES A DAY AS NEEDED FOR DIZZINESS   niacin 100 MG tablet Take by mouth.   pantoprazole 40 MG tablet Commonly known as:  PROTONIX   Phendimetrazine Tartrate 105 MG Cp24 Reported on 11/19/2015   phentermine 37.5 MG tablet Commonly known as:  ADIPEX-P Reported on 11/19/2015   PROAIR HFA 108 (90 Base) MCG/ACT inhaler Generic drug:  albuterol Inhale into the lungs.   RA VITAMIN B-12 TR 1000 MCG Tbcr Generic drug:  Cyanocobalamin Take by mouth. Reported on 11/19/2015   sertraline 100 MG tablet Commonly known as:  ZOLOFT Take by mouth.   valACYclovir 500 MG tablet Commonly known as:  VALTREX Take by mouth.   Vitamin D3 2000 units capsule Take by mouth.   XARELTO 20 MG Tabs tablet Generic drug:  rivaroxaban Take 20 mg by mouth daily.       Allergies:  Allergies  Allergen Reactions  . Cefaclor Other (See Comments)    Other reaction(s): Unknown  . Penicillins     Other reaction(s): Unknown  . Phenytoin     Other reaction(s): Unknown  . Phenytoin Sodium Extended Other (See Comments)    Family History: Family History  Problem Relation Age of Onset  . Hypertension Mother   . Hypertension Father   . Prostate cancer Paternal Uncle   . Kidney cancer Neg Hx   . Bladder Cancer Neg Hx     Social History:  reports that she has been smoking Cigarettes.  She has been smoking about 0.50 packs per day. She has never used smokeless tobacco. She reports that she does not drink alcohol or use drugs.  ROS: UROLOGY Frequent  Urination?: Yes Hard to postpone urination?: Yes Burning/pain with urination?: No Get up at night to urinate?: No Leakage of urine?: No Urine stream starts and stops?: Yes Trouble starting stream?: No Do you have to strain to urinate?: No Blood in urine?: No Urinary tract infection?: No Sexually transmitted disease?:  No Injury to kidneys or bladder?: No Painful intercourse?: No Weak stream?: No Currently pregnant?: No Vaginal bleeding?: No Last menstrual period?: n  Gastrointestinal Nausea?: No Vomiting?: No Indigestion/heartburn?: No Diarrhea?: No Constipation?: No  Constitutional Fever: No Night sweats?: No Weight loss?: No Fatigue?: No  Skin Skin rash/lesions?: No Itching?: No  Eyes Blurred vision?: No Double vision?: No  Ears/Nose/Throat Sore throat?: No Sinus problems?: No  Hematologic/Lymphatic Swollen glands?: No Easy bruising?: No  Cardiovascular Leg swelling?: No Chest pain?: No  Respiratory Cough?: No Shortness of breath?: No  Endocrine Excessive thirst?: No  Musculoskeletal Back pain?: No Joint pain?: No  Neurological Headaches?: No Dizziness?: No  Psychologic Depression?: No Anxiety?: No   Physical Exam: BP 129/77   Pulse 78   Ht 5\' 3"  (1.6 m)   Wt 229 lb 4.8 oz (104 kg)   BMI 40.62 kg/m   Constitutional: Well nourished. Alert and oriented, No acute distress. HEENT: [place] AT, moist mucus membranes. Trachea midline, no masses. Cardiovascular: No clubbing, cyanosis, or edema. Respiratory: Normal respiratory effort, no increased work of breathing. Skin: No rashes, bruises or suspicious lesions. Lymph: No cervical or inguinal adenopathy. Neurologic: Grossly intact, no focal deficits, moving all 4 extremities. Psychiatric: Normal mood and affect.  Laboratory Data: Lab Results  Component Value Date   WBC 6.6 04/12/2014   HGB 12.5 04/12/2014   HCT 37.0 04/12/2014   MCV 91 04/12/2014   PLT 278 04/12/2014    Lab  Results  Component Value Date   CREATININE 0.74 04/12/2014    Lab Results  Component Value Date   AST 21 03/30/2014   Lab Results  Component Value Date   ALT 26 03/30/2014    PTNS treatment: The needle electrode was inserted into the lower, inner aspect of the patient's left leg. The surface electrode was placed on the inside arch of the foot on the treatment leg. The lead set was connected to the stimulator and the needle electrode clip was connected to the needle electrode. The stimulator that produces an adjustable electrical pulse that travels to the sacral nerve plexus via the tibial nerve was increased to 10 until the patient received a toe flex and a sensory response.     Assessment & Plan:    1. Urge incontinence  Treatment Plan:  The needle electrode was removed without difficulty to the patient.  Patient tolerated the procedure for 30 minutes.  She will return next week for # 10 out of 12 of their weekly PTNS treatment's    Return in about 1 week (around 11/11/2016) for # 10 PTNS.  These notes generated with voice recognition software. I apologize for typographical errors.  Michiel Cowboy, PA-C  [facility] [address], [address] [place], [place] [address] [phone]

## 2016-11-04 ENCOUNTER — Encounter: Payer: Self-pay | Admitting: Urology

## 2016-11-04 ENCOUNTER — Ambulatory Visit (INDEPENDENT_AMBULATORY_CARE_PROVIDER_SITE_OTHER): Payer: Medicare HMO | Admitting: Urology

## 2016-11-04 VITALS — BP 129/77 | HR 78 | Ht 63.0 in | Wt 229.3 lb

## 2016-11-04 DIAGNOSIS — N3941 Urge incontinence: Secondary | ICD-10-CM | POA: Diagnosis not present

## 2016-11-04 NOTE — Progress Notes (Signed)
PTNS  Session # 9  Health & Social Factors: No Change Caffeine: 3 Alcohol: 0 Daytime voids #per day:6  Night-time voids #per night: 0 Urgency: Mild Incontinence Episodes #per day: 0 Ankle used: Left Treatment Setting: 10 Feeling/ Response:Both   Preformed By: Michiel Cowboy PA-C  Assistant: Dallas Schimke CMA  Follow Up: One week  Patient given 1 month samples of Toviaz 8 mg.

## 2016-11-10 NOTE — Progress Notes (Signed)
Chief Complaint:  Chief Complaint  Patient presents with  . PTNS     HPI: 51 yo WF who presents today for her 10th of twelve weekly PTNS treatments.  Tracey Chandler: Patient is a 51 year old Caucasian female with MS and a history of GU issues including urethral discharge/bleeding and OAB symptoms including mixed urinary incontinence who was given a trial Myrbetriq 25 mg daily who presents today for follow-up.  She had been on Vesicare 5 mg daily in the past with good symptom control, but started to lose its effectiveness and the dry mouth was becoming bothersome to her.  She states that she did not find the Myrbetriq samples beneficial. She felt that the samples did not curb her urinary urge incontinence. She felt the Vesicare provided more benefit.  Dr. Sherron Chandler When I saw the patient in May she was much better on Toviaz 8 mg and had no microscopic hematuria.  The Tracey Chandler is working but she still can leak when she goes from a sitting to standing position. Her frequency is stable. She does not wear pads but sometimes has to change her underclothes.  She understands that we've likely maximize medical therapy and I talked her about percutaneous tibial nerve stimulation with my usual template. She would like to try this therapy in combination with Toviaz. If she does beautifully the Tracey Chandler could be stopped.  I will order a percutaneous tibial nerve stimulation and reassess her in 3 months  Baseline symptoms are 4 daytime voids, 1 night time voids and 0 incontinence.  Today, she is complaining of frequency, urgency and intermittency.    She is having 6 day time voids (worsening), 0 night time void  (improved) and 0 incontinence episode daily (stable).   She is not having dysuria, gross hematuria and suprapubic pain.  She is not having fevers, chills, nausea and vomiting.             Previous Therapy:     Behavioral Modification Techniques            Pelvic Floor Exercises - insurance would  not cover           Biofeedback - insurance would not cover           Bladder Training - insurance would not cover           Fluid Management - encourage water intake           Dietary Restrictions - encourage smoking sensation       Contraindications present for PTNS      Pacemaker - NO      Implantable defibrillator - NO      History of abnormal bleeding - NO      History of neuropathies or nerve damage - NO  Discussed with patient possible complications of procedure, such as discomfort, bleeding at insertion/stimulation site, procedure consent signed  Patient goals:     Reduced urgency  PMH: Past Medical History:  Diagnosis Date  . Cervical dysplasia   . COPD (chronic obstructive pulmonary disease) (HCC)   . Discharge from the vagina 11/19/2014  . Fibromyalgia   . H/O: obesity 04/06/2014  . Hematuria   . Herpes, genital   . Microscopic hematuria 11/23/2015  . Mixed incontinence   . MS (multiple sclerosis) (HCC)   . Multiple sclerosis (HCC) 06/03/2015  . OAB (overactive bladder)   . Obesity   . Pulmonary embolism (HCC)   . Snores     Surgical History: Past Surgical  History:  Procedure Laterality Date  . ABDOMINAL HYSTERECTOMY    . Bladder mesh    . CHOLECYSTECTOMY    . Chonca batosa    . DILATION AND CURETTAGE OF UTERUS    . LASER ABLATION CONDYLOMA CERVICAL / VULVAR      Home Medications:  Allergies as of 11/11/2016      Reactions   Cefaclor Other (See Comments)   Other reaction(s): Unknown   Penicillins    Other reaction(s): Unknown   Phenytoin    Other reaction(s): Unknown   Phenytoin Sodium Extended Other (See Comments)      Medication List       Accurate as of 11/11/16 10:41 AM. Always use your most recent med list.          fesoterodine 8 MG Tb24 tablet Commonly known as:  TOVIAZ Take 1 tablet (8 mg total) by mouth daily.   fluticasone 50 MCG/ACT nasal spray Commonly known as:  FLONASE Place into the nose.   HYDROcodone-acetaminophen 5-325  MG tablet Commonly known as:  NORCO/VICODIN Take 1 tablet by mouth every 8 (eight) hours as needed. for pain   LYRICA 150 MG capsule Generic drug:  pregabalin Take 150 mg by mouth 2 (two) times daily.   Magnesium 250 MG Tabs Take by mouth.   meclizine 12.5 MG tablet Commonly known as:  ANTIVERT TAKE 1 TABLET BY MOUTH 3 TIMES A DAY AS NEEDED FOR DIZZINESS   niacin 100 MG tablet Take by mouth.   pantoprazole 40 MG tablet Commonly known as:  PROTONIX   PROAIR HFA 108 (90 Base) MCG/ACT inhaler Generic drug:  albuterol Inhale into the lungs.   RA VITAMIN B-12 TR 1000 MCG Tbcr Generic drug:  Cyanocobalamin Take by mouth. Reported on 11/19/2015   sertraline 100 MG tablet Commonly known as:  ZOLOFT Take by mouth.   valACYclovir 500 MG tablet Commonly known as:  VALTREX Take by mouth.   Vitamin D3 2000 units capsule Take by mouth.   XARELTO 20 MG Tabs tablet Generic drug:  rivaroxaban Take 20 mg by mouth daily.       Allergies:  Allergies  Allergen Reactions  . Cefaclor Other (See Comments)    Other reaction(s): Unknown  . Penicillins     Other reaction(s): Unknown  . Phenytoin     Other reaction(s): Unknown  . Phenytoin Sodium Extended Other (See Comments)    Family History: Family History  Problem Relation Age of Onset  . Hypertension Mother   . Hypertension Father   . Prostate cancer Paternal Uncle   . Kidney cancer Neg Hx   . Bladder Cancer Neg Hx     Social History:  reports that she has been smoking Cigarettes.  She has been smoking about 0.50 packs per day. She has never used smokeless tobacco. She reports that she does not drink alcohol or use drugs.  ROS: UROLOGY Frequent Urination?: Yes Hard to postpone urination?: Yes Burning/pain with urination?: No Get up at night to urinate?: No Leakage of urine?: No Urine stream starts and stops?: Yes Trouble starting stream?: No Do you have to strain to urinate?: No Blood in urine?: No Urinary  tract infection?: No Sexually transmitted disease?: No Injury to kidneys or bladder?: No Painful intercourse?: No Weak stream?: No Currently pregnant?: No Vaginal bleeding?: No Last menstrual period?: n  Gastrointestinal Nausea?: No Vomiting?: No Indigestion/heartburn?: No Diarrhea?: No Constipation?: No  Constitutional Fever: No Night sweats?: No Weight loss?: No Fatigue?: No  Skin Skin  rash/lesions?: No Itching?: No  Eyes Blurred vision?: No Double vision?: No  Ears/Nose/Throat Sore throat?: No Sinus problems?: No  Hematologic/Lymphatic Swollen glands?: No Easy bruising?: No  Cardiovascular Leg swelling?: No Chest pain?: No  Respiratory Cough?: No Shortness of breath?: No  Endocrine Excessive thirst?: No  Musculoskeletal Back pain?: No Joint pain?: No  Neurological Headaches?: No Dizziness?: No  Psychologic Depression?: No Anxiety?: No   Physical Exam: BP 129/82 (BP Location: Left Arm, Patient Position: Sitting, Cuff Size: Large)   Pulse 81   Ht 5\' 3"  (1.6 m)   Wt 230 lb 9.6 oz (104.6 kg)   BMI 40.85 kg/m   Constitutional: Well nourished. Alert and oriented, No acute distress. HEENT: [place] AT, moist mucus membranes. Trachea midline, no masses. Cardiovascular: No clubbing, cyanosis, or edema. Respiratory: Normal respiratory effort, no increased work of breathing. Skin: No rashes, bruises or suspicious lesions. Lymph: No cervical or inguinal adenopathy. Neurologic: Grossly intact, no focal deficits, moving all 4 extremities. Psychiatric: Normal mood and affect.  Laboratory Data: Lab Results  Component Value Date   WBC 6.6 04/12/2014   HGB 12.5 04/12/2014   HCT 37.0 04/12/2014   MCV 91 04/12/2014   PLT 278 04/12/2014    Lab Results  Component Value Date   CREATININE 0.74 04/12/2014    Lab Results  Component Value Date   AST 21 03/30/2014   Lab Results  Component Value Date   ALT 26 03/30/2014    PTNS treatment: The  needle electrode was inserted into the lower, inner aspect of the patient's right leg. The surface electrode was placed on the inside arch of the foot on the treatment leg. The lead set was connected to the stimulator and the needle electrode clip was connected to the needle electrode. The stimulator that produces an adjustable electrical pulse that travels to the sacral nerve plexus via the tibial nerve was increased to 19 until the patient received a sensory response.     Assessment & Plan:    1. Urge incontinence  Treatment Plan:  The needle electrode was removed without difficulty to the patient.  Patient tolerated the procedure for 30 minutes.  She will return next week for # 11 out of 12 of their weekly PTNS treatment's    Return in about 1 week (around 11/18/2016) for # 11 PTNS.  These notes generated with voice recognition software. I apologize for typographical errors.  Michiel Cowboy, PA-C  [facility] [address] [place], [place] [address] [phone]

## 2016-11-11 ENCOUNTER — Encounter: Payer: Self-pay | Admitting: Urology

## 2016-11-11 ENCOUNTER — Ambulatory Visit (INDEPENDENT_AMBULATORY_CARE_PROVIDER_SITE_OTHER): Payer: Medicare HMO | Admitting: Urology

## 2016-11-11 VITALS — BP 129/82 | HR 81 | Ht 63.0 in | Wt 230.6 lb

## 2016-11-11 DIAGNOSIS — N3941 Urge incontinence: Secondary | ICD-10-CM

## 2016-11-11 NOTE — Progress Notes (Signed)
PTNS  Session # 10  Health & Social Factors: no change Caffeine: 3 Alcohol: 0 Daytime voids #per day: 6 Night-time voids #per night: 0 Urgency: mild Incontinence Episodes #per day: 0 Ankle used: right Treatment Setting: 19 Feeling/ Response: sensory Comments: patient tolerated well  Preformed By: Michiel Cowboy, PA-C  Assistant:  Teressa Lower, CMA  Follow Up: 1 week

## 2016-11-17 NOTE — Progress Notes (Signed)
Chief Complaint:  Chief Complaint  Patient presents with  . PTNS    Urge incontinence     HPI: 51 yo WF who presents today for her 11th of twelve weekly PTNS treatments.  Bronc Brosseau: Patient is a 51 year old Caucasian female with MS and a history of GU issues including urethral discharge/bleeding and OAB symptoms including mixed urinary incontinence who was given a trial Myrbetriq 25 mg daily who presents today for follow-up.  She had been on Vesicare 5 mg daily in the past with good symptom control, but started to lose its effectiveness and the dry mouth was becoming bothersome to her.  She states that she did not find the Myrbetriq samples beneficial. She felt that the samples did not curb her urinary urge incontinence. She felt the Vesicare provided more benefit.  Dr. Sherron Monday When I saw the patient in May she was much better on Toviaz 8 mg and had no microscopic hematuria.  The [name] is working but she still can leak when she goes from a sitting to standing position. Her frequency is stable. She does not wear pads but sometimes has to change her underclothes.  She understands that we've likely maximize medical therapy and I talked her about percutaneous tibial nerve stimulation with my usual template. She would like to try this therapy in combination with Toviaz. If she does beautifully the [name] could be stopped.  I will order a percutaneous tibial nerve stimulation and reassess her in 3 months  Baseline symptoms are 4 daytime voids, 1 night time voids and 0 incontinence.  Today, she is complaining of frequency, urgency and intermittency.    She is having 6 day time voids (worse), 0 night time void  (worse) and 0 incontinence episode daily (stable).   Her urgency is mild.  She is not having dysuria, gross hematuria and suprapubic pain.  She is not having fevers, chills, nausea and vomiting.             Previous Therapy:     Behavioral Modification Techniques   Pelvic Floor Exercises - insurance would not cover           Biofeedback - insurance would not cover           Bladder Training - insurance would not cover           Fluid Management - encourage water intake           Dietary Restrictions - encourage smoking sensation       Contraindications present for PTNS      Pacemaker - NO      Implantable defibrillator - NO      History of abnormal bleeding - NO      History of neuropathies or nerve damage - NO  Discussed with patient possible complications of procedure, such as discomfort, bleeding at insertion/stimulation site, procedure consent signed  Patient goals:     Reduced urgency  PMH: Past Medical History:  Diagnosis Date  . Cervical dysplasia   . COPD (chronic obstructive pulmonary disease) (HCC)   . Discharge from the vagina 11/19/2014  . Fibromyalgia   . H/O: obesity 04/06/2014  . Hematuria   . Herpes, genital   . Microscopic hematuria 11/23/2015  . Mixed incontinence   . MS (multiple sclerosis) (HCC)   . Multiple sclerosis (HCC) 06/03/2015  . OAB (overactive bladder)   . Obesity   . Pulmonary embolism (HCC)   . Snores     Surgical History: Past Surgical  History:  Procedure Laterality Date  . ABDOMINAL HYSTERECTOMY    . Bladder mesh    . CHOLECYSTECTOMY    . Chonca batosa    . DILATION AND CURETTAGE OF UTERUS    . LASER ABLATION CONDYLOMA CERVICAL / VULVAR      Home Medications:  Allergies as of 11/18/2016      Reactions   Cefaclor Other (See Comments)   Other reaction(s): Unknown   Penicillins    Other reaction(s): Unknown   Phenytoin    Other reaction(s): Unknown   Phenytoin Sodium Extended Other (See Comments)      Medication List       Accurate as of 11/18/16 10:59 AM. Always use your most recent med list.          fesoterodine 8 MG Tb24 tablet Commonly known as:  TOVIAZ Take 1 tablet (8 mg total) by mouth daily.   fluticasone 50 MCG/ACT nasal spray Commonly known as:  FLONASE Place into the  nose.   HYDROcodone-acetaminophen 5-325 MG tablet Commonly known as:  NORCO/VICODIN Take 1 tablet by mouth every 8 (eight) hours as needed. for pain   LYRICA 150 MG capsule Generic drug:  pregabalin Take 150 mg by mouth 2 (two) times daily.   Magnesium 250 MG Tabs Take by mouth.   meclizine 12.5 MG tablet Commonly known as:  ANTIVERT TAKE 1 TABLET BY MOUTH 3 TIMES A DAY AS NEEDED FOR DIZZINESS   niacin 100 MG tablet Take by mouth.   pantoprazole 40 MG tablet Commonly known as:  PROTONIX   PROAIR HFA 108 (90 Base) MCG/ACT inhaler Generic drug:  albuterol Inhale into the lungs.   RA VITAMIN B-12 TR 1000 MCG Tbcr Generic drug:  Cyanocobalamin Take by mouth. Reported on 11/19/2015   sertraline 100 MG tablet Commonly known as:  ZOLOFT Take by mouth.   valACYclovir 500 MG tablet Commonly known as:  VALTREX Take by mouth.   Vitamin D3 2000 units capsule Take by mouth.   XARELTO 20 MG Tabs tablet Generic drug:  rivaroxaban Take 20 mg by mouth daily.       Allergies:  Allergies  Allergen Reactions  . Cefaclor Other (See Comments)    Other reaction(s): Unknown  . Penicillins     Other reaction(s): Unknown  . Phenytoin     Other reaction(s): Unknown  . Phenytoin Sodium Extended Other (See Comments)    Family History: Family History  Problem Relation Age of Onset  . Hypertension Mother   . Hypertension Father   . Prostate cancer Paternal Uncle   . Kidney cancer Neg Hx   . Bladder Cancer Neg Hx     Social History:  reports that she has been smoking Cigarettes.  She has been smoking about 0.50 packs per day. She has never used smokeless tobacco. She reports that she does not drink alcohol or use drugs.  ROS: UROLOGY Frequent Urination?: Yes Hard to postpone urination?: Yes Burning/pain with urination?: No Get up at night to urinate?: No Leakage of urine?: No Urine stream starts and stops?: Yes Trouble starting stream?: No Do you have to strain to  urinate?: No Blood in urine?: No Urinary tract infection?: No Sexually transmitted disease?: No Injury to kidneys or bladder?: No Painful intercourse?: No Weak stream?: No Currently pregnant?: No Vaginal bleeding?: No Last menstrual period?: n  Gastrointestinal Nausea?: No Vomiting?: No Indigestion/heartburn?: No Diarrhea?: No Constipation?: No  Constitutional Fever: No Night sweats?: No Weight loss?: No Fatigue?: No  Skin Skin  rash/lesions?: No Itching?: No  Eyes Blurred vision?: No Double vision?: No  Ears/Nose/Throat Sore throat?: No Sinus problems?: No  Hematologic/Lymphatic Swollen glands?: No Easy bruising?: No  Cardiovascular Leg swelling?: No Chest pain?: No  Respiratory Cough?: No Shortness of breath?: No  Endocrine Excessive thirst?: No  Musculoskeletal Back pain?: No Joint pain?: No  Neurological Headaches?: No Dizziness?: No  Psychologic Depression?: No Anxiety?: No   Physical Exam: BP 122/81   Pulse 87   Ht 5\' 3"  (1.6 m)   Wt 227 lb 12.8 oz (103.3 kg)   BMI 40.35 kg/m   Constitutional: Well nourished. Alert and oriented, No acute distress. HEENT: [place] AT, moist mucus membranes. Trachea midline, no masses. Cardiovascular: No clubbing, cyanosis, or edema. Respiratory: Normal respiratory effort, no increased work of breathing. Skin: No rashes, bruises or suspicious lesions. Lymph: No cervical or inguinal adenopathy. Neurologic: Grossly intact, no focal deficits, moving all 4 extremities. Psychiatric: Normal mood and affect.  Laboratory Data: Lab Results  Component Value Date   WBC 6.6 04/12/2014   HGB 12.5 04/12/2014   HCT 37.0 04/12/2014   MCV 91 04/12/2014   PLT 278 04/12/2014    Lab Results  Component Value Date   CREATININE 0.74 04/12/2014    Lab Results  Component Value Date   AST 21 03/30/2014   Lab Results  Component Value Date   ALT 26 03/30/2014    PTNS treatment: The needle electrode was  inserted into the lower, inner aspect of the patient's left leg. The surface electrode was placed on the inside arch of the foot on the treatment leg. The lead set was connected to the stimulator and the needle electrode clip was connected to the needle electrode. The stimulator that produces an adjustable electrical pulse that travels to the sacral nerve plexus via the tibial nerve was increased to 5 until the patient received a sensory.      Assessment & Plan:    1. Urge incontinence  Treatment Plan:  The needle electrode was removed without difficulty to the patient.  Patient tolerated the procedure for 30 minutes.  She will return next week for # 12 out of 12 of their weekly PTNS treatment's    Return in about 1 week (around 11/25/2016) for # 12 PTNS.  These notes generated with voice recognition software. I apologize for typographical errors.  Michiel Cowboy, PA-C  [facility] [address] [place], [place] [address] [phone]

## 2016-11-18 ENCOUNTER — Ambulatory Visit (INDEPENDENT_AMBULATORY_CARE_PROVIDER_SITE_OTHER): Payer: Medicare HMO | Admitting: Urology

## 2016-11-18 ENCOUNTER — Encounter: Payer: Self-pay | Admitting: Urology

## 2016-11-18 VITALS — BP 122/81 | HR 87 | Ht 63.0 in | Wt 227.8 lb

## 2016-11-18 DIAGNOSIS — N3941 Urge incontinence: Secondary | ICD-10-CM | POA: Diagnosis not present

## 2016-11-18 LAB — PTNS-PERCUTANEOUS TIBIAL NERVE STIMULATION: SCAN RESULT: 5

## 2016-11-18 NOTE — Progress Notes (Signed)
PTNS  Session # 11  Health & Social Factors: No Change Caffeine: 3 Alcohol: 0 Daytime voids #per day: 6 Night-time voids #per night: 0 Urgency: Mild Incontinence Episodes #per day: 0 Ankle used: Left Treatment Setting: 5 Feeling/ Response: Sensory  Preformed By: Michiel Cowboy PA-C  Assistant: Ashley Mariner CMA  Follow Up: One week

## 2016-11-24 NOTE — Progress Notes (Signed)
Chief Complaint:  Chief Complaint  Patient presents with  . PTNS    urge incontinence     HPI: 51 yo WF who presents today for her 12th of twelve weekly PTNS treatments.  Tracey Chandler: Patient is a 51 year old Caucasian female with MS and a history of GU issues including urethral discharge/bleeding and OAB symptoms including mixed urinary incontinence who was given a trial Myrbetriq 25 mg daily who presents today for follow-up.  She had been on Vesicare 5 mg daily in Tracey past with good symptom control, but started to lose its effectiveness and Tracey dry mouth was becoming bothersome to her.  She states that she did not find Tracey Myrbetriq samples beneficial. She felt that Tracey samples did not curb her urinary urge incontinence. She felt Tracey Vesicare provided more benefit.  Dr. Sherron Monday When I saw Tracey patient in May she was much better on Toviaz 8 mg and had no microscopic hematuria.  Tracey Chandler is working but she still can leak when she goes from a sitting to standing position. Her frequency is stable. She does not wear pads but sometimes has to change her underclothes.  She understands that we've likely maximize medical therapy and I talked her about percutaneous tibial nerve stimulation with my usual template. She would like to try this therapy in combination with Toviaz. If she does beautifully Tracey Chandler could be stopped.  I will order a percutaneous tibial nerve stimulation and reassess her in 3 months  Baseline symptoms are 4 daytime voids, 1 night time voids and 0 incontinence.  Today, she is complaining of frequency, urgency and intermittency.    She is having 6 day time voids (worse), 0 night time void  (improved) and 0 incontinence episode daily (stable).   Her urgency is mild.  She is not having dysuria, gross hematuria and suprapubic pain.  She is not having fevers, chills, nausea and vomiting.             Previous Therapy:     Behavioral Modification Techniques   Pelvic Floor Exercises - insurance would not cover           Biofeedback - insurance would not cover           Bladder Training - insurance would not cover           Fluid Management - encourage water intake           Dietary Restrictions - encourage smoking sensation       Contraindications present for PTNS      Pacemaker - NO      Implantable defibrillator - NO      History of abnormal bleeding - NO      History of neuropathies or nerve damage - NO  Discussed with patient possible complications of procedure, such as discomfort, bleeding at insertion/stimulation site, procedure consent signed  Patient goals:     Reduced urgency  PMH: Past Medical History:  Diagnosis Date  . Cervical dysplasia   . COPD (chronic obstructive pulmonary disease) (HCC)   . Discharge from Tracey vagina 11/19/2014  . Fibromyalgia   . H/O: obesity 04/06/2014  . Hematuria   . Herpes, genital   . Microscopic hematuria 11/23/2015  . Mixed incontinence   . MS (multiple sclerosis) (HCC)   . Multiple sclerosis (HCC) 06/03/2015  . OAB (overactive bladder)   . Obesity   . Pulmonary embolism (HCC)   . Snores     Surgical History: Past Surgical  History:  Procedure Laterality Date  . ABDOMINAL HYSTERECTOMY    . Bladder mesh    . CHOLECYSTECTOMY    . Chonca batosa    . DILATION AND CURETTAGE OF UTERUS    . LASER ABLATION CONDYLOMA CERVICAL / VULVAR      Home Medications:  Allergies as of 11/25/2016      Reactions   Cefaclor Other (See Comments)   Other reaction(s): Unknown   Penicillins    Other reaction(s): Unknown   Phenytoin    Other reaction(s): Unknown   Phenytoin Sodium Extended Other (See Comments)      Medication List       Accurate as of 11/25/16  2:00 PM. Always use your most recent med list.          fesoterodine 8 MG Tb24 tablet Commonly known as:  TOVIAZ Take 1 tablet (8 mg total) by mouth daily.   fluticasone 50 MCG/ACT nasal spray Commonly known as:  FLONASE Place into Tracey  nose.   HYDROcodone-acetaminophen 5-325 MG tablet Commonly known as:  NORCO/VICODIN Take 1 tablet by mouth every 8 (eight) hours as needed. for pain   LYRICA 150 MG capsule Generic drug:  pregabalin Take 150 mg by mouth 2 (two) times daily.   Magnesium 250 MG Tabs Take by mouth.   meclizine 12.5 MG tablet Commonly known as:  ANTIVERT TAKE 1 TABLET BY MOUTH 3 TIMES A DAY AS NEEDED FOR DIZZINESS   niacin 100 MG tablet Take by mouth.   pantoprazole 40 MG tablet Commonly known as:  PROTONIX   PROAIR HFA 108 (90 Base) MCG/ACT inhaler Generic drug:  albuterol Inhale into Tracey lungs.   RA VITAMIN B-12 TR 1000 MCG Tbcr Generic drug:  Cyanocobalamin Take by mouth. Reported on 11/19/2015   sertraline 100 MG tablet Commonly known as:  ZOLOFT Take by mouth.   valACYclovir 500 MG tablet Commonly known as:  VALTREX Take by mouth.   Vitamin D3 2000 units capsule Take by mouth.   XARELTO 20 MG Tabs tablet Generic drug:  rivaroxaban Take 20 mg by mouth daily.       Allergies:  Allergies  Allergen Reactions  . Cefaclor Other (See Comments)    Other reaction(s): Unknown  . Penicillins     Other reaction(s): Unknown  . Phenytoin     Other reaction(s): Unknown  . Phenytoin Sodium Extended Other (See Comments)    Family History: Family History  Problem Relation Age of Onset  . Hypertension Mother   . Hypertension Father   . Prostate cancer Paternal Uncle   . Kidney cancer Neg Hx   . Bladder Cancer Neg Hx     Social History:  reports that she has been smoking Cigarettes.  She has been smoking about 0.50 packs per day. She has never used smokeless tobacco. She reports that she does not drink alcohol or use drugs.  ROS: UROLOGY Frequent Urination?: Yes Hard to postpone urination?: Yes Burning/pain with urination?: No Get up at night to urinate?: No Leakage of urine?: No Urine stream starts and stops?: Yes Trouble starting stream?: No Do you have to strain to  urinate?: No Blood in urine?: No Urinary tract infection?: No Sexually transmitted disease?: No Injury to kidneys or bladder?: No Painful intercourse?: No Weak stream?: No Currently pregnant?: No Vaginal bleeding?: No Last menstrual period?: n  Gastrointestinal Nausea?: No Vomiting?: No Indigestion/heartburn?: No Diarrhea?: No Constipation?: No  Constitutional Fever: No Night sweats?: No Weight loss?: No Fatigue?: No  Skin  Skin rash/lesions?: No Itching?: No  Eyes Blurred vision?: No Double vision?: No  Ears/Nose/Throat Sore throat?: No Sinus problems?: No  Hematologic/Lymphatic Swollen glands?: No Easy bruising?: No  Cardiovascular Leg swelling?: No Chest pain?: No  Respiratory Cough?: No Shortness of breath?: No  Endocrine Excessive thirst?: No  Musculoskeletal Back pain?: No Joint pain?: No  Neurological Headaches?: No Dizziness?: No  Psychologic Depression?: No Anxiety?: No   Physical Exam: BP 138/76   Pulse 88   Ht 5\' 3"  (1.6 m)   Wt 230 lb 1.6 oz (104.4 kg)   BMI 40.76 kg/m   Constitutional: Well nourished. Alert and oriented, No acute distress. HEENT:  AT, moist mucus membranes. Trachea midline, no masses. Cardiovascular: No clubbing, cyanosis, or edema. Respiratory: Normal respiratory effort, no increased work of breathing. Skin: No rashes, bruises or suspicious lesions. Lymph: No cervical or inguinal adenopathy. Neurologic: Grossly intact, no focal deficits, moving all 4 extremities. Psychiatric: Normal mood and affect.  Laboratory Data: Lab Results  Component Value Date   WBC 6.6 04/12/2014   HGB 12.5 04/12/2014   HCT 37.0 04/12/2014   MCV 91 04/12/2014   PLT 278 04/12/2014    Lab Results  Component Value Date   CREATININE 0.74 04/12/2014    Lab Results  Component Value Date   AST 21 03/30/2014   Lab Results  Component Value Date   ALT 26 03/30/2014    PTNS treatment: Tracey needle electrode was  inserted into Tracey lower, inner aspect of Tracey patient's right leg. Tracey surface electrode was placed on Tracey inside arch of Tracey foot on Tracey treatment leg. Tracey lead set was connected to Tracey stimulator and Tracey needle electrode clip was connected to Tracey needle electrode. Tracey stimulator that produces an adjustable electrical pulse that travels to Tracey sacral nerve plexus via Tracey tibial nerve was increased to 4 until Tracey patient received a toe flex and a sensory response.     Assessment & Plan:    1. Urge incontinence  Treatment Plan:  Tracey needle electrode was removed without difficulty to Tracey patient.  Patient tolerated Tracey procedure for 30 minutes.  She will return next month for Tracey Chandler   2. Bloody vaginal discharge  -  Patient will make an appointment    Return for Maintenance PTNS.  These notes generated with voice recognition software. I apologize for typographical errors.  Michiel Cowboy, PA-C  [facility] [address], [address] [place], [place] [address] [phone]

## 2016-11-25 ENCOUNTER — Encounter: Payer: Self-pay | Admitting: Urology

## 2016-11-25 ENCOUNTER — Ambulatory Visit (INDEPENDENT_AMBULATORY_CARE_PROVIDER_SITE_OTHER): Payer: Medicare HMO | Admitting: Urology

## 2016-11-25 VITALS — BP 138/76 | HR 88 | Ht 63.0 in | Wt 230.1 lb

## 2016-11-25 DIAGNOSIS — N3941 Urge incontinence: Secondary | ICD-10-CM

## 2016-11-25 LAB — PTNS-PERCUTANEOUS TIBIAL NERVE STIMULATION: SCAN RESULT: 4

## 2016-11-25 NOTE — Progress Notes (Signed)
PTNS  Session # 12  Health & Social Factors: No Change Caffeine: 3 Alcohol: 0 Daytime voids #per day: 6 Night-time voids #per night: 0 Urgency: Mild Incontinence Episodes #per day: 0 Ankle used: Right Treatment Setting: 4 Feeling/ Response: Both   Preformed By: Michiel Cowboy PA-C  Assistant: Dallas Schimke CMA  Follow Up: One Month

## 2016-11-30 ENCOUNTER — Ambulatory Visit (INDEPENDENT_AMBULATORY_CARE_PROVIDER_SITE_OTHER): Payer: Medicare HMO | Admitting: Urology

## 2016-11-30 ENCOUNTER — Encounter: Payer: Self-pay | Admitting: Urology

## 2016-11-30 VITALS — BP 116/75 | HR 92 | Ht 63.0 in | Wt 228.4 lb

## 2016-11-30 DIAGNOSIS — N898 Other specified noninflammatory disorders of vagina: Secondary | ICD-10-CM | POA: Diagnosis not present

## 2016-11-30 LAB — URINALYSIS, COMPLETE
BILIRUBIN UA: NEGATIVE
Glucose, UA: NEGATIVE
Ketones, UA: NEGATIVE
Leukocytes, UA: NEGATIVE
Nitrite, UA: NEGATIVE
PH UA: 5.5 (ref 5.0–7.5)
PROTEIN UA: NEGATIVE
RBC UA: NEGATIVE
SPEC GRAV UA: 1.02 (ref 1.005–1.030)
Urobilinogen, Ur: 0.2 mg/dL (ref 0.2–1.0)

## 2016-11-30 LAB — MICROSCOPIC EXAMINATION
RBC, UA: NONE SEEN /hpf (ref 0–?)
WBC, UA: NONE SEEN /hpf (ref 0–?)

## 2016-11-30 NOTE — Progress Notes (Signed)
In and Out Catheterization  Patient is present today for a I & O catheterization due to vaginal discharge/bleeding. Patient was cleaned and prepped in a sterile fashion with betadine and Lidocaine 2% jelly was instilled into the urethra.  A 14FR cath was inserted no complications were noted , of urine return was noted, urine was yellow in color. A clean urine sample was collected for Urinalysis. Bladder was drained and catheter was removed with out difficulty.    Preformed by: Dallas Schimke CMA

## 2016-11-30 NOTE — Progress Notes (Signed)
11/30/2016 2:10 PM   Tracey Chandler [birth year] [id number]  Referring provider: Lyndon Code, MD [address] [place], [place] [address]  Chief Complaint  Patient presents with  . Vaginal Discharge    HPI: Patient is a 51 year old WF who is requesting an urgent appointment for a bloody discharge that has been occurring on and off for the last two years.    She has had a hysterectomy and a bladder sling > 10 years ago.    She states that when she walks at seems to bring on the brownish discharge.  She is sexually active, but she states she is monogamous with her husband.  She does not have vaginal burning or itching associated with the discharge.  She stated she had been discharged 2 days ago, but she has not seen the discharge today.    She has urgency and frequency of urination but these are baseline symptoms.   Her UA today was unremarkable. It was a catheterized specimen.  She states that her gynecologist has also evaluated her for this brownish discharge, but they could not find an etiology for the discharge.  She underwent a hematuria work up in 2015 with CTU and cystoscopy.  CTU did not identify a reason for her hematuria. Patient said she underwent cystoscopy, but I do not have a record of this. She states she was told her cystoscopy was normal.     PMH: Past Medical History:  Diagnosis Date  . Cervical dysplasia   . COPD (chronic obstructive pulmonary disease) (HCC)   . Discharge from the vagina 11/19/2014  . Fibromyalgia   . H/O: obesity 04/06/2014  . Hematuria   . Herpes, genital   . Microscopic hematuria 11/23/2015  . Mixed incontinence   . MS (multiple sclerosis) (HCC)   . Multiple sclerosis (HCC) 06/03/2015  . OAB (overactive bladder)   . Obesity   . Pulmonary embolism (HCC)   . Snores     Surgical History: Past Surgical History:  Procedure Laterality Date  . ABDOMINAL HYSTERECTOMY    . Bladder mesh    . CHOLECYSTECTOMY    . Chonca batosa    . DILATION  AND CURETTAGE OF UTERUS    . LASER ABLATION CONDYLOMA CERVICAL / VULVAR      Home Medications:  Allergies as of 11/30/2016      Reactions   Cefaclor Other (See Comments)   Other reaction(s): Unknown   Penicillins    Other reaction(s): Unknown   Phenytoin    Other reaction(s): Unknown   Phenytoin Sodium Extended Other (See Comments)      Medication List       Accurate as of 11/30/16  2:10 PM. Always use your most recent med list.          ALLEGRA-D 12 HOUR PO Allegra-D 12 Hour 60 mg-120 mg tablet,extended release  TAKE 1 TABLET BY MOUTH TWICE A DAY AS NEEDED FOR CONGESTION   diclofenac sodium 1 % Gel Commonly known as:  VOLTAREN diclofenac 1 % topical gel   esomeprazole 40 MG capsule Commonly known as:  NEXIUM esomeprazole magnesium 40 mg capsule,delayed release   fesoterodine 8 MG Tb24 tablet Commonly known as:  TOVIAZ Take 1 tablet (8 mg total) by mouth daily.   fluticasone 50 MCG/ACT nasal spray Commonly known as:  FLONASE Place into the nose.   HYDROcodone-acetaminophen 5-325 MG tablet Commonly known as:  NORCO/VICODIN Take 1 tablet by mouth every 8 (eight) hours as needed. for pain  LYRICA 150 MG capsule Generic drug:  pregabalin Take 150 mg by mouth 2 (two) times daily.   Magnesium 250 MG Tabs Take by mouth.   meclizine 12.5 MG tablet Commonly known as:  ANTIVERT TAKE 1 TABLET BY MOUTH 3 TIMES A DAY AS NEEDED FOR DIZZINESS   niacin 100 MG tablet Take by mouth.   pantoprazole 40 MG tablet Commonly known as:  PROTONIX   phentermine 37.5 MG tablet Commonly known as:  ADIPEX-P phentermine 37.5 mg tablet  TAKE 1 TABLET BY MOUTH ONCE A DAY FOR WEIGHT LOSS   PROAIR HFA 108 (90 Base) MCG/ACT inhaler Generic drug:  albuterol Inhale into the lungs.   RA VITAMIN B-12 TR 1000 MCG Tbcr Generic drug:  Cyanocobalamin Take by mouth. Reported on 11/19/2015   SALINE NASAL MIST NA Saline Nasal 0.65 % spray aerosol  USE 1 SPRAY IN EACH NOSTRIL TWICE A DAY     sertraline 100 MG tablet Commonly known as:  ZOLOFT Take by mouth.   valACYclovir 500 MG tablet Commonly known as:  VALTREX Take by mouth.   Vitamin D3 2000 units capsule Take by mouth.   XARELTO 20 MG Tabs tablet Generic drug:  rivaroxaban Take 20 mg by mouth daily.       Allergies:  Allergies  Allergen Reactions  . Cefaclor Other (See Comments)    Other reaction(s): Unknown  . Penicillins     Other reaction(s): Unknown  . Phenytoin     Other reaction(s): Unknown  . Phenytoin Sodium Extended Other (See Comments)    Family History: Family History  Problem Relation Age of Onset  . Hypertension Mother   . Hypertension Father   . Prostate cancer Paternal Uncle   . Kidney cancer Neg Hx   . Bladder Cancer Neg Hx     Social History:  reports that she has been smoking Cigarettes.  She has been smoking about 0.50 packs per day. She has never used smokeless tobacco. She reports that she does not drink alcohol or use drugs.  ROS: UROLOGY Frequent Urination?: Yes Hard to postpone urination?: Yes Burning/pain with urination?: No Get up at night to urinate?: No Leakage of urine?: No Urine stream starts and stops?: Yes Trouble starting stream?: No Do you have to strain to urinate?: No Blood in urine?: No Urinary tract infection?: No Sexually transmitted disease?: No Injury to kidneys or bladder?: No Painful intercourse?: No Weak stream?: No Currently pregnant?: No Vaginal bleeding?: Yes Last menstrual period?: n  Gastrointestinal Nausea?: No Vomiting?: No Indigestion/heartburn?: No Diarrhea?: No Constipation?: No  Constitutional Fever: No Night sweats?: No Weight loss?: No Fatigue?: No  Skin Skin rash/lesions?: No Itching?: No  Eyes Blurred vision?: No Double vision?: No  Ears/Nose/Throat Sore throat?: No Sinus problems?: No  Hematologic/Lymphatic Swollen glands?: No Easy bruising?: Yes  Cardiovascular Leg swelling?: No Chest pain?:  No  Respiratory Cough?: No Shortness of breath?: No  Endocrine Excessive thirst?: No  Musculoskeletal Back pain?: No Joint pain?: No  Neurological Headaches?: No Dizziness?: No  Psychologic Depression?: No Anxiety?: No  Physical Exam: BP 116/75   Pulse 92   Ht 5\' 3"  (1.6 m)   Wt 228 lb 6.4 oz (103.6 kg)   BMI 40.46 kg/m   Constitutional: Well nourished. Alert and oriented, No acute distress. HEENT: [place] AT, moist mucus membranes. Trachea midline, no masses. Cardiovascular: No clubbing, cyanosis, or edema. Respiratory: Normal respiratory effort, no increased work of breathing. GI: Abdomen is soft, non tender, non distended, no abdominal masses.  Liver and spleen not palpable.  No hernias appreciated.  Stool sample for occult testing is not indicated.   GU: No CVA tenderness.  No bladder fullness or masses.  Normal external genitalia, normal pubic hair distribution, no lesions.  Normal urethral meatus, no lesions, no prolapse, no discharge.   No urethral masses, tenderness and/or tenderness. No bladder fullness, tenderness or masses. Normal vagina mucosa, good estrogen effect, no discharge, no lesions, good pelvic support, no cystocele or rectocele noted.  Cervix and uterus are surgically absent.  No adnexal/parametria masses or tenderness noted.  Anus and perineum are without rashes or lesions.    Skin: No rashes, bruises or suspicious lesions. Lymph: No cervical or inguinal adenopathy. Neurologic: Grossly intact, no focal deficits, moving all 4 extremities. Psychiatric: Normal mood and affect.  Laboratory Data: Lab Results  Component Value Date   WBC 6.6 04/12/2014   HGB 12.5 04/12/2014   HCT 37.0 04/12/2014   MCV 91 04/12/2014   PLT 278 04/12/2014    Lab Results  Component Value Date   CREATININE 0.74 04/12/2014    Lab Results  Component Value Date   AST 21 03/30/2014   Lab Results  Component Value Date   ALT 26 03/30/2014     Urinalysis Unremarkable.   See EPIC.    Assessment & Plan:    1. Vaginal discharge  - advised patient to contact her gynecologist for further evaluation of vaginal discharge  - Urinalysis, Complete   Return in about 1 month (around 12/31/2016) for Maintenance PTNS.  These notes generated with voice recognition software. I apologize for typographical errors.  Michiel Cowboy, PA-C  [facility] [address], [address] [place], [place] [address] [phone]

## 2016-12-02 ENCOUNTER — Ambulatory Visit: Payer: Medicare HMO | Admitting: Urology

## 2016-12-23 ENCOUNTER — Telehealth: Payer: Self-pay | Admitting: Urology

## 2016-12-23 NOTE — Telephone Encounter (Signed)
Pt called to cancel her monthly PTNS.  She doesn't wish to continue PTNS.  She is having problems with her ankles.  Just F.Y.I.

## 2016-12-30 DIAGNOSIS — B9689 Other specified bacterial agents as the cause of diseases classified elsewhere: Secondary | ICD-10-CM | POA: Diagnosis not present

## 2016-12-30 DIAGNOSIS — B379 Candidiasis, unspecified: Secondary | ICD-10-CM | POA: Diagnosis not present

## 2016-12-30 DIAGNOSIS — N760 Acute vaginitis: Secondary | ICD-10-CM | POA: Diagnosis not present

## 2016-12-30 DIAGNOSIS — N898 Other specified noninflammatory disorders of vagina: Secondary | ICD-10-CM | POA: Diagnosis not present

## 2017-01-04 ENCOUNTER — Ambulatory Visit: Payer: Medicare HMO | Admitting: Urology

## 2017-01-11 DIAGNOSIS — J449 Chronic obstructive pulmonary disease, unspecified: Secondary | ICD-10-CM | POA: Diagnosis not present

## 2017-01-11 DIAGNOSIS — Z6841 Body Mass Index (BMI) 40.0 and over, adult: Secondary | ICD-10-CM | POA: Diagnosis not present

## 2017-01-11 DIAGNOSIS — I2699 Other pulmonary embolism without acute cor pulmonale: Secondary | ICD-10-CM | POA: Diagnosis not present

## 2017-01-18 DIAGNOSIS — M1710 Unilateral primary osteoarthritis, unspecified knee: Secondary | ICD-10-CM | POA: Insufficient documentation

## 2017-01-18 DIAGNOSIS — M179 Osteoarthritis of knee, unspecified: Secondary | ICD-10-CM | POA: Insufficient documentation

## 2017-01-18 DIAGNOSIS — M170 Bilateral primary osteoarthritis of knee: Secondary | ICD-10-CM | POA: Diagnosis not present

## 2017-01-26 DIAGNOSIS — R1031 Right lower quadrant pain: Secondary | ICD-10-CM | POA: Diagnosis not present

## 2017-01-26 DIAGNOSIS — B9689 Other specified bacterial agents as the cause of diseases classified elsewhere: Secondary | ICD-10-CM | POA: Diagnosis not present

## 2017-01-26 DIAGNOSIS — N760 Acute vaginitis: Secondary | ICD-10-CM | POA: Diagnosis not present

## 2017-01-26 DIAGNOSIS — N898 Other specified noninflammatory disorders of vagina: Secondary | ICD-10-CM | POA: Diagnosis not present

## 2017-01-26 DIAGNOSIS — Z113 Encounter for screening for infections with a predominantly sexual mode of transmission: Secondary | ICD-10-CM | POA: Diagnosis not present

## 2017-01-27 DIAGNOSIS — R1031 Right lower quadrant pain: Secondary | ICD-10-CM | POA: Diagnosis not present

## 2017-02-06 IMAGING — US US EXTREM LOW VENOUS*L*
1 series · 13 of 24 positions shown · non-contrast
Comparison: None available.

CLINICAL DATA: Initial evaluation for acute left leg pain.



[Series 1: us extrem low venous*left* · 0.08mm/px · 13 of 35 slices shown]
[im 1/35]
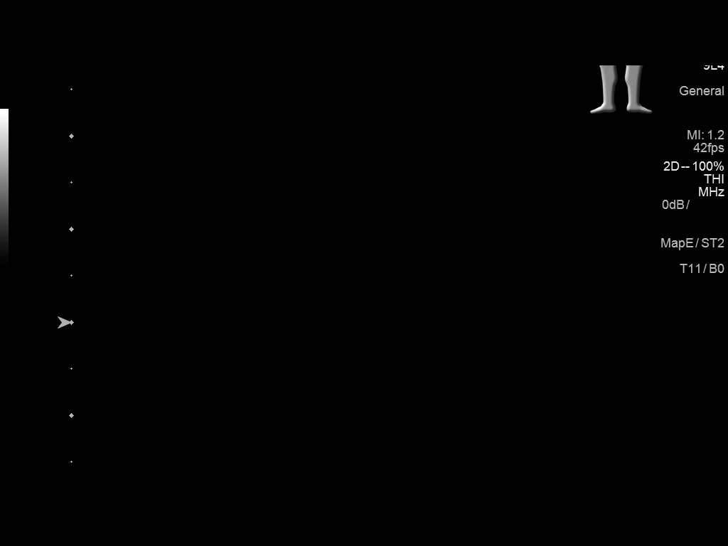
[im 3/35]
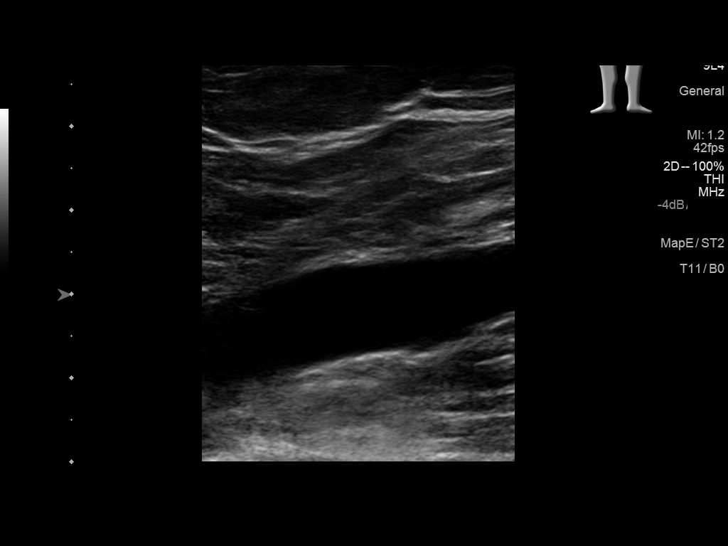
[im 6/35]
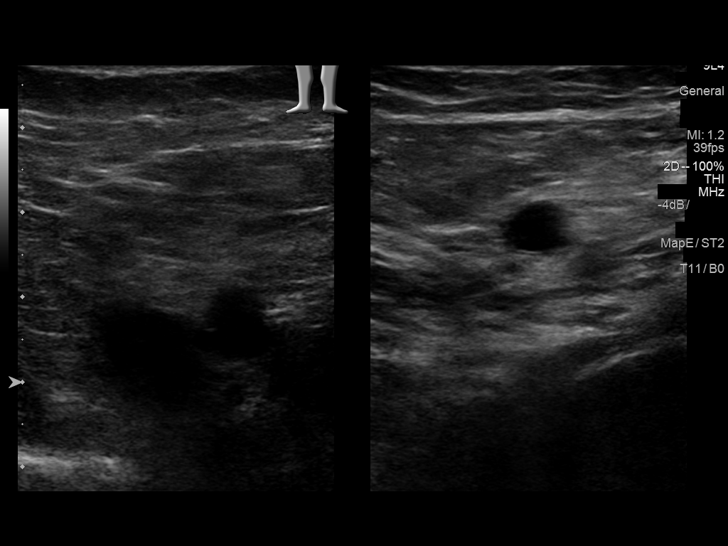
[im 9/35]
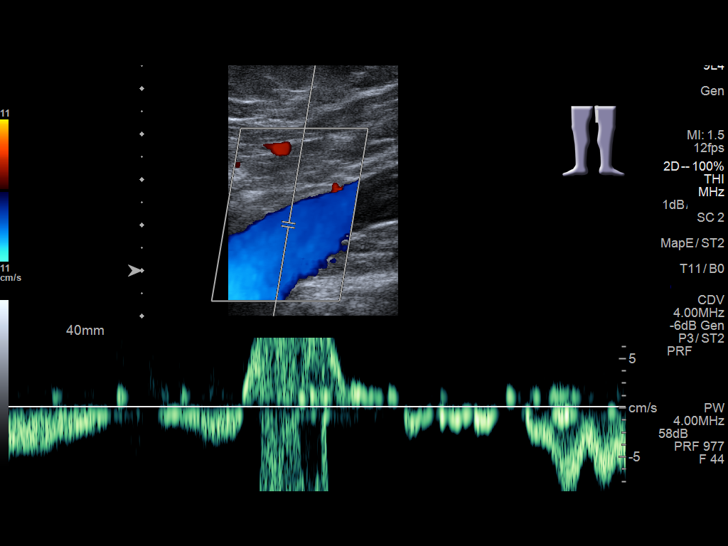
[im 12/35]
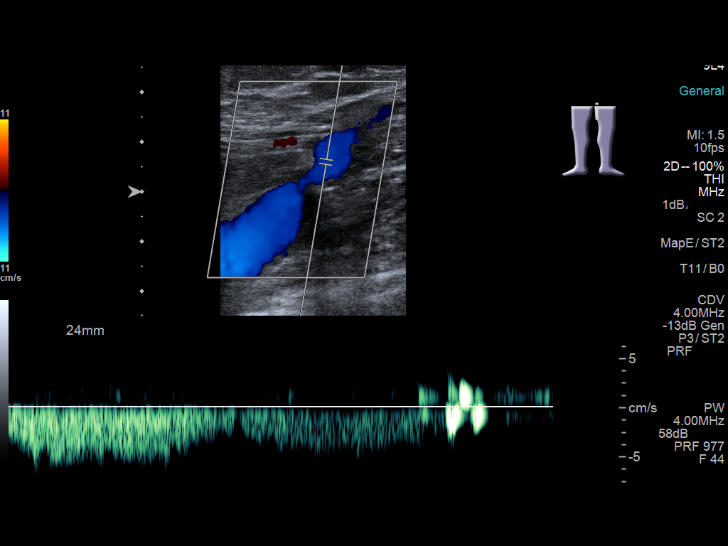
[im 15/35]
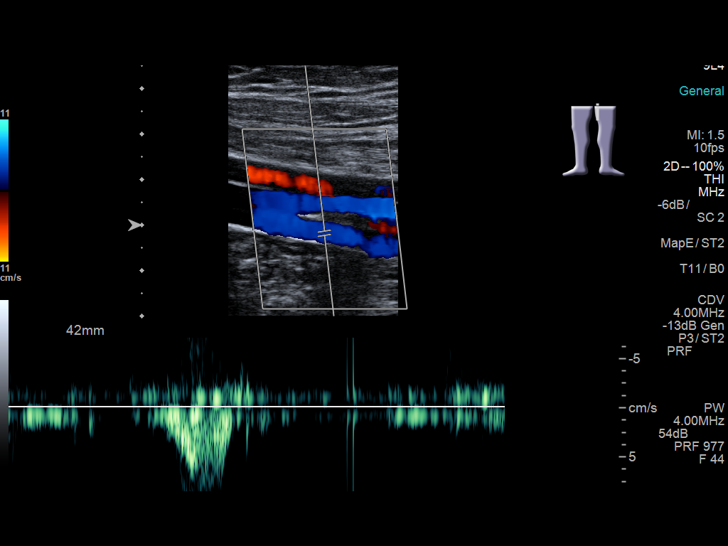
[im 18/35]
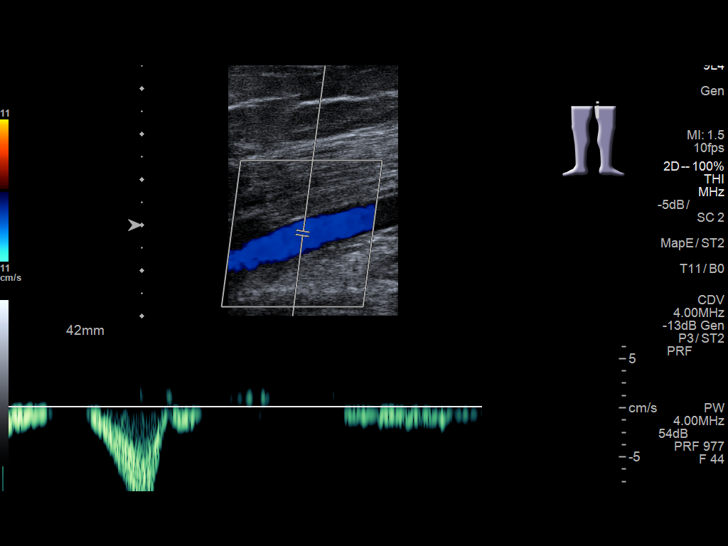
[im 20/35]
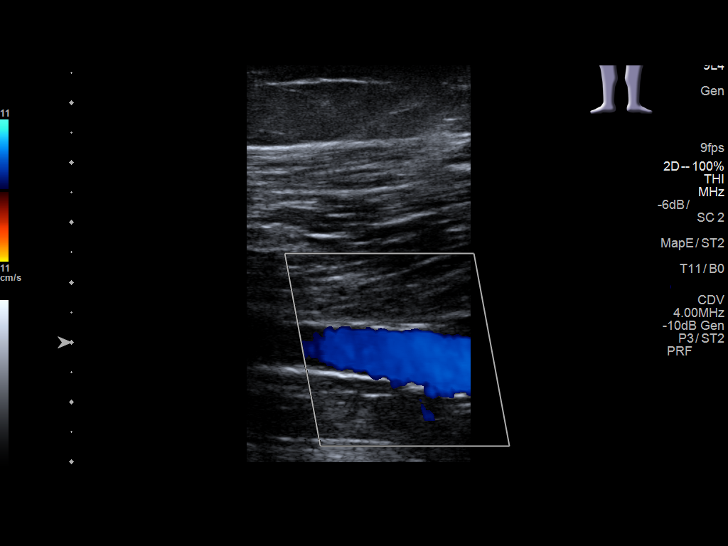
[im 23/35]
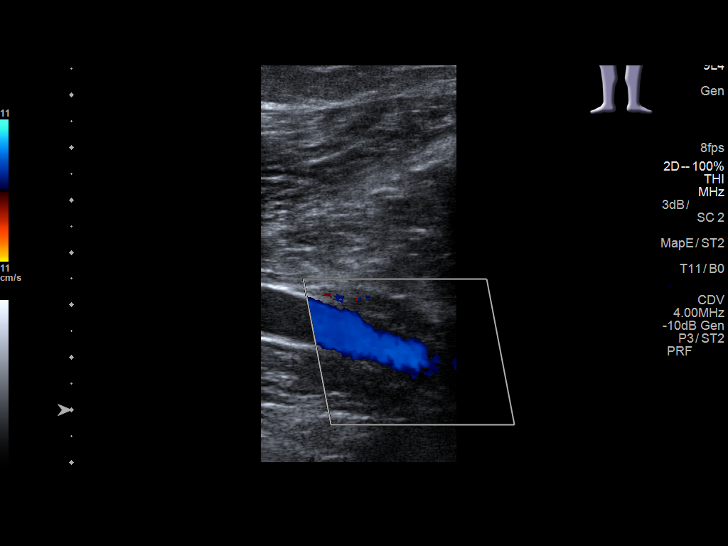
[im 26/35]
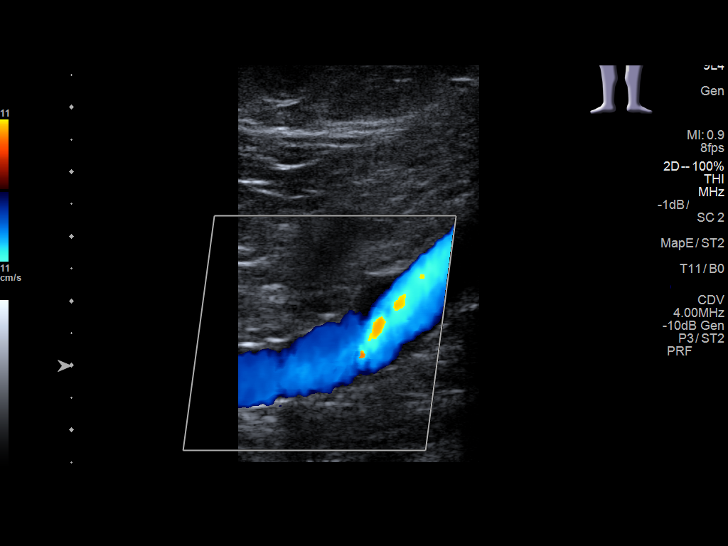
[im 29/35]
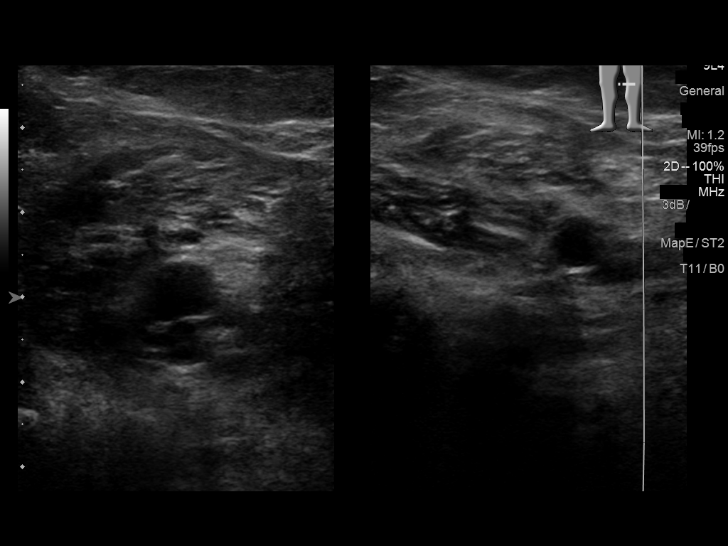
[im 32/35]
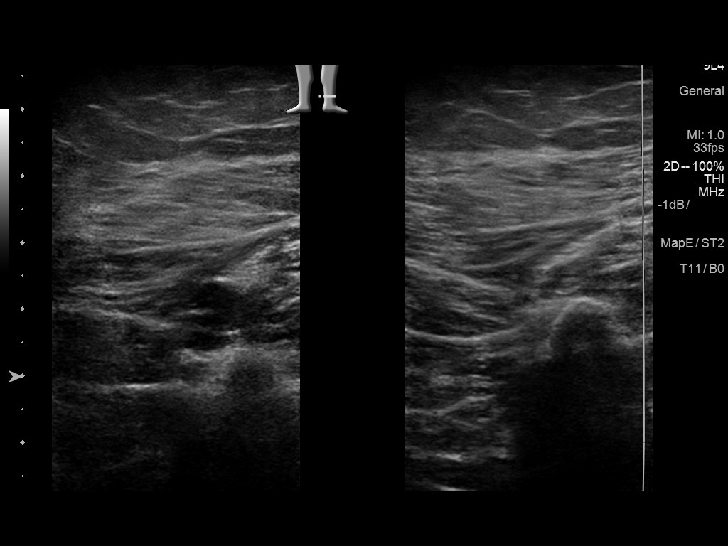
[im 35/35]
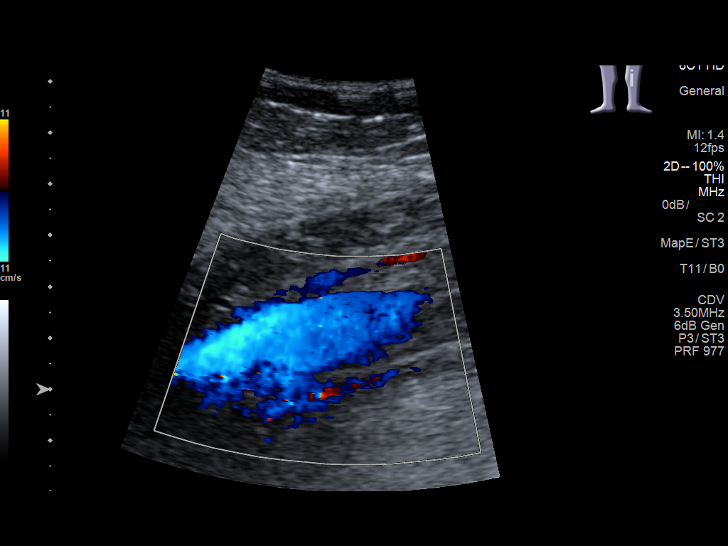

[13 of 24 positions shown; findings below may reference images not displayed]

FINDINGS: Contralateral Common Femoral Vein: Respiratory phasicity is normal
and symmetric with the symptomatic side. No evidence of thrombus.
Normal compressibility.

Common Femoral Vein: No evidence of thrombus. Normal
compressibility, respiratory phasicity and response to augmentation.

Saphenofemoral Junction: No evidence of thrombus. Normal
compressibility and flow on color Doppler imaging.

Profunda Femoral Vein: No evidence of thrombus. Normal
compressibility and flow on color Doppler imaging.

Femoral Vein: No evidence of thrombus. Normal compressibility,
respiratory phasicity and response to augmentation.

Popliteal Vein: No evidence of thrombus. Normal compressibility,
respiratory phasicity and response to augmentation.

Calf Veins: No evidence of thrombus. Normal compressibility and flow
on color Doppler imaging.

Superficial Great Saphenous Vein: No evidence of thrombus. Normal
compressibility and flow on color Doppler imaging.

Venous Reflux:  None.

Other Findings:  None.
IMPRESSION: No evidence of deep venous thrombosis.

## 2017-02-09 DIAGNOSIS — Z87440 Personal history of urinary (tract) infections: Secondary | ICD-10-CM | POA: Diagnosis not present

## 2017-02-09 DIAGNOSIS — N760 Acute vaginitis: Secondary | ICD-10-CM | POA: Diagnosis not present

## 2017-02-09 DIAGNOSIS — B373 Candidiasis of vulva and vagina: Secondary | ICD-10-CM | POA: Diagnosis not present

## 2017-03-01 DIAGNOSIS — N760 Acute vaginitis: Secondary | ICD-10-CM | POA: Diagnosis not present

## 2017-03-01 DIAGNOSIS — B9689 Other specified bacterial agents as the cause of diseases classified elsewhere: Secondary | ICD-10-CM | POA: Diagnosis not present

## 2017-03-01 DIAGNOSIS — N898 Other specified noninflammatory disorders of vagina: Secondary | ICD-10-CM | POA: Diagnosis not present

## 2017-03-09 DIAGNOSIS — M1711 Unilateral primary osteoarthritis, right knee: Secondary | ICD-10-CM | POA: Diagnosis not present

## 2017-03-09 DIAGNOSIS — S8001XA Contusion of right knee, initial encounter: Secondary | ICD-10-CM | POA: Diagnosis not present

## 2017-03-09 DIAGNOSIS — M25461 Effusion, right knee: Secondary | ICD-10-CM | POA: Diagnosis not present

## 2017-03-23 DIAGNOSIS — M1711 Unilateral primary osteoarthritis, right knee: Secondary | ICD-10-CM | POA: Diagnosis not present

## 2017-03-23 DIAGNOSIS — M25461 Effusion, right knee: Secondary | ICD-10-CM | POA: Diagnosis not present

## 2017-04-06 DIAGNOSIS — M25461 Effusion, right knee: Secondary | ICD-10-CM | POA: Diagnosis not present

## 2017-04-06 DIAGNOSIS — M1711 Unilateral primary osteoarthritis, right knee: Secondary | ICD-10-CM | POA: Diagnosis not present

## 2017-04-07 DIAGNOSIS — Z01419 Encounter for gynecological examination (general) (routine) without abnormal findings: Secondary | ICD-10-CM | POA: Diagnosis not present

## 2017-04-07 DIAGNOSIS — Z1211 Encounter for screening for malignant neoplasm of colon: Secondary | ICD-10-CM | POA: Diagnosis not present

## 2017-04-07 DIAGNOSIS — Z124 Encounter for screening for malignant neoplasm of cervix: Secondary | ICD-10-CM | POA: Diagnosis not present

## 2017-04-07 DIAGNOSIS — R87610 Atypical squamous cells of undetermined significance on cytologic smear of cervix (ASC-US): Secondary | ICD-10-CM | POA: Diagnosis not present

## 2017-04-08 DIAGNOSIS — S83261A Peripheral tear of lateral meniscus, current injury, right knee, initial encounter: Secondary | ICD-10-CM | POA: Diagnosis not present

## 2017-04-08 DIAGNOSIS — M25461 Effusion, right knee: Secondary | ICD-10-CM | POA: Diagnosis not present

## 2017-04-08 DIAGNOSIS — M1711 Unilateral primary osteoarthritis, right knee: Secondary | ICD-10-CM | POA: Diagnosis not present

## 2017-04-12 DIAGNOSIS — M25561 Pain in right knee: Secondary | ICD-10-CM | POA: Diagnosis not present

## 2017-04-12 DIAGNOSIS — M6281 Muscle weakness (generalized): Secondary | ICD-10-CM | POA: Diagnosis not present

## 2017-04-12 DIAGNOSIS — M25661 Stiffness of right knee, not elsewhere classified: Secondary | ICD-10-CM | POA: Diagnosis not present

## 2017-04-15 DIAGNOSIS — G35 Multiple sclerosis: Secondary | ICD-10-CM | POA: Diagnosis not present

## 2017-04-15 DIAGNOSIS — M25561 Pain in right knee: Secondary | ICD-10-CM | POA: Diagnosis not present

## 2017-04-20 DIAGNOSIS — R609 Edema, unspecified: Secondary | ICD-10-CM | POA: Diagnosis not present

## 2017-04-20 DIAGNOSIS — M6281 Muscle weakness (generalized): Secondary | ICD-10-CM | POA: Diagnosis not present

## 2017-04-20 DIAGNOSIS — M25561 Pain in right knee: Secondary | ICD-10-CM | POA: Diagnosis not present

## 2017-04-23 DIAGNOSIS — Z1231 Encounter for screening mammogram for malignant neoplasm of breast: Secondary | ICD-10-CM | POA: Diagnosis not present

## 2017-04-23 DIAGNOSIS — R609 Edema, unspecified: Secondary | ICD-10-CM | POA: Diagnosis not present

## 2017-04-23 DIAGNOSIS — M25561 Pain in right knee: Secondary | ICD-10-CM | POA: Diagnosis not present

## 2017-04-23 DIAGNOSIS — M1711 Unilateral primary osteoarthritis, right knee: Secondary | ICD-10-CM | POA: Diagnosis not present

## 2017-04-23 DIAGNOSIS — M25661 Stiffness of right knee, not elsewhere classified: Secondary | ICD-10-CM | POA: Diagnosis not present

## 2017-04-27 DIAGNOSIS — R609 Edema, unspecified: Secondary | ICD-10-CM | POA: Diagnosis not present

## 2017-04-27 DIAGNOSIS — M25661 Stiffness of right knee, not elsewhere classified: Secondary | ICD-10-CM | POA: Diagnosis not present

## 2017-04-27 DIAGNOSIS — M25561 Pain in right knee: Secondary | ICD-10-CM | POA: Diagnosis not present

## 2017-05-05 DIAGNOSIS — M79645 Pain in left finger(s): Secondary | ICD-10-CM | POA: Diagnosis not present

## 2017-05-05 DIAGNOSIS — M79644 Pain in right finger(s): Secondary | ICD-10-CM | POA: Diagnosis not present

## 2017-05-17 DIAGNOSIS — R05 Cough: Secondary | ICD-10-CM | POA: Diagnosis not present

## 2017-05-17 DIAGNOSIS — J069 Acute upper respiratory infection, unspecified: Secondary | ICD-10-CM | POA: Diagnosis not present

## 2017-06-24 ENCOUNTER — Telehealth: Payer: Self-pay

## 2017-06-24 NOTE — Telephone Encounter (Signed)
Pt called stating her insurance will no longer cover toviaz. Pt stated that she has failed therapy on vesicare and myrbetriq. Pt requested another medication. Please advise.

## 2017-06-25 NOTE — Telephone Encounter (Signed)
She can have a script of oxybutynin 15 mg XL and we will need to see her in 6 weeks.

## 2017-06-29 ENCOUNTER — Other Ambulatory Visit: Payer: Self-pay

## 2017-06-29 ENCOUNTER — Other Ambulatory Visit: Payer: Self-pay | Admitting: Family Medicine

## 2017-06-29 MED ORDER — OXYBUTYNIN CHLORIDE ER 15 MG PO TB24: 15.0000 mg | ORAL_TABLET | Freq: Every day | ORAL | 3 refills | Status: DC

## 2017-06-29 NOTE — Telephone Encounter (Signed)
Done  Maralyn Sago e scribed medication I called patient but no vm set up App made

## 2017-06-30 ENCOUNTER — Ambulatory Visit (INDEPENDENT_AMBULATORY_CARE_PROVIDER_SITE_OTHER): Payer: Medicare Other | Admitting: Nurse Practitioner

## 2017-06-30 ENCOUNTER — Encounter: Payer: Self-pay | Admitting: Nurse Practitioner

## 2017-06-30 VITALS — BP 140/80 | HR 88 | Resp 16 | Ht 63.0 in | Wt 227.0 lb

## 2017-06-30 DIAGNOSIS — N3941 Urge incontinence: Secondary | ICD-10-CM | POA: Diagnosis not present

## 2017-06-30 DIAGNOSIS — F339 Major depressive disorder, recurrent, unspecified: Secondary | ICD-10-CM

## 2017-06-30 DIAGNOSIS — B009 Herpesviral infection, unspecified: Secondary | ICD-10-CM | POA: Diagnosis not present

## 2017-06-30 DIAGNOSIS — J45991 Cough variant asthma: Secondary | ICD-10-CM

## 2017-06-30 DIAGNOSIS — M797 Fibromyalgia: Secondary | ICD-10-CM | POA: Diagnosis not present

## 2017-06-30 DIAGNOSIS — K219 Gastro-esophageal reflux disease without esophagitis: Secondary | ICD-10-CM | POA: Diagnosis not present

## 2017-06-30 DIAGNOSIS — F411 Generalized anxiety disorder: Secondary | ICD-10-CM | POA: Diagnosis not present

## 2017-06-30 DIAGNOSIS — I2699 Other pulmonary embolism without acute cor pulmonale: Secondary | ICD-10-CM

## 2017-06-30 DIAGNOSIS — F410 Panic disorder [episodic paroxysmal anxiety] without agoraphobia: Secondary | ICD-10-CM

## 2017-06-30 MED ORDER — SERTRALINE HCL 100 MG PO TABS: 150.0000 mg | ORAL_TABLET | Freq: Every day | ORAL | 4 refills | Status: DC

## 2017-06-30 MED ORDER — HYDROCODONE-ACETAMINOPHEN 5-325 MG PO TABS: 1.0000 | ORAL_TABLET | Freq: Three times a day (TID) | ORAL | 0 refills | Status: DC | PRN

## 2017-06-30 MED ORDER — PANTOPRAZOLE SODIUM 40 MG PO TBEC: 40.0000 mg | DELAYED_RELEASE_TABLET | Freq: Every day | ORAL | 4 refills | Status: DC

## 2017-06-30 MED ORDER — XARELTO 20 MG PO TABS: 20.0000 mg | ORAL_TABLET | Freq: Every day | ORAL | 4 refills | Status: DC

## 2017-06-30 MED ORDER — LYRICA 150 MG PO CAPS: 150.0000 mg | ORAL_CAPSULE | Freq: Two times a day (BID) | ORAL | 1 refills | Status: DC

## 2017-06-30 MED ORDER — OXYBUTYNIN CHLORIDE ER 15 MG PO TB24: 15.0000 mg | ORAL_TABLET | Freq: Every day | ORAL | 4 refills | Status: DC

## 2017-06-30 MED ORDER — VALACYCLOVIR HCL 500 MG PO TABS: 500.0000 mg | ORAL_TABLET | Freq: Every day | ORAL | 4 refills | Status: DC

## 2017-06-30 MED ORDER — BUSPIRONE HCL 5 MG PO TABS: 5.0000 mg | ORAL_TABLET | Freq: Three times a day (TID) | ORAL | 1 refills | Status: DC

## 2017-06-30 NOTE — Progress Notes (Signed)
Subjective:     Patient ID: Tracey Chandler, female   DOB: 11/07/1965, 51 y.o.   MRN: [id number]  1.The patient has cough. Has been going on for a few weeks. Was treated at prior visit with antibiotics. Feeling much better, but still has the congested cough.  2. Having panic attacks. Severe. Can't breathe, can't move. Currently on zoloft 150mg . Does not have any medication to take as needed . 3 continues to be treated for fibromyalgia. Takes lyrica and hdrocodone/APAP as needed for severe pain. She needs to have a refill for her hydrocodone today.    Outpatient Encounter Medications as of 06/30/2017  Medication Sig Note  . albuterol (PROAIR HFA) 108 (90 BASE) MCG/ACT inhaler Inhale into the lungs. 06/03/2015: [facility]  . Cholecalciferol (VITAMIN D3) 2000 UNITS capsule Take by mouth. 06/03/2015: Received from: [facility]  . Cyanocobalamin (RA VITAMIN B-12 TR) 1000 MCG TBCR Take by mouth. Reported on 11/19/2015 06/03/2015: [facility]  . diclofenac sodium (VOLTAREN) 1 % GEL diclofenac 1 % topical gel 11/30/2016: PRN  . esomeprazole (NEXIUM) 40 MG capsule esomeprazole magnesium 40 mg capsule,delayed release   . fesoterodine (TOVIAZ) 8 MG TB24 tablet Take 1 tablet (8 mg total) by mouth daily.   Marland Kitchen. Fexofenadine-Pseudoephedrine (ALLEGRA-D 12 HOUR PO) Allegra-D 12 Hour 60 mg-120 mg tablet,extended release  TAKE 1 TABLET BY MOUTH TWICE A DAY AS NEEDED FOR CONGESTION 11/30/2016: PRN  . fluticasone (FLONASE) 50 MCG/ACT nasal spray Place into the nose. 06/03/2015: [facility]  . HYDROcodone-acetaminophen (NORCO/VICODIN) 5-325 MG tablet Take 1 tablet by mouth every 8 (eight) hours as needed. for pain 09/30/2016: PRN  . LYRICA 150 MG capsule Take 150 mg by mouth 2 (two) times daily. 06/03/2015: Received from: External Pharmacy  . Magnesium 250 MG TABS Take by mouth. 06[facility]03/2015: [facility]  . meclizine (ANTIVERT) 12.5 MG tablet TAKE 1 TABLET BY MOUTH 3 TIMES A DAY AS NEEDED FOR DIZZINESS 09/30/2016: PRN  . niacin 100 MG tablet Take by mouth. 06/03/2015: [facility]  . oxybutynin (DITROPAN XL) 15 MG 24 hr tablet Take 1 tablet (15 mg total) by mouth daily.   . pantoprazole (PROTONIX) 40 MG tablet  06/03/2015: Received from: External Pharmacy  . phentermine (ADIPEX-P) 37.5 MG tablet phentermine 37.5 mg tablet  TAKE 1 TABLET BY MOUTH ONCE A DAY FOR WEIGHT LOSS   . SALINE NASAL MIST NA Saline Nasal 0.65 % spray aerosol  USE 1 SPRAY IN EACH NOSTRIL TWICE A DAY   . sertraline (ZOLOFT) 100 MG tablet Take by mouth. 06/03/2015: [facility]  . valACYclovir (VALTREX) 500 MG tablet Take by mouth. 09/30/2016: PRN  . XARELTO 20 MG TABS tablet Take 20 mg by mouth daily. 06/03/2015: Received from: External Pharmacy   No facility-administered encounter medications on file as of 06/30/2017.       Review of Systems  Constitutional: Positive for fatigue. Negative for activity change, appetite change, chills, diaphoresis, fever and unexpected weight change.  HENT: Positive for congestion and rhinorrhea.   Respiratory: Positive for cough.   Gastrointestinal: Positive for nausea. Negative for abdominal pain.  Endocrine: Negative.   Genitourinary: Negative.   Skin: Negative.   Allergic/Immunologic: Negative.   Neurological: Negative.   Hematological: Negative.   Psychiatric/Behavioral: Positive for agitation. Negative for sleep disturbance and suicidal ideas.       Panic attacks  Objective:   Physical Exam  Constitutional: She is oriented to person, place, and time. She appears well-developed and well-nourished.  HENT:  Head: Normocephalic and atraumatic.  Neck: Normal range of motion. Neck supple.  Cardiovascular: Normal rate and regular rhythm.  Pulmonary/Chest: Effort normal and breath  sounds normal. No accessory muscle usage. No respiratory distress.  Musculoskeletal:  The patient's right knee is in jointed brace per orthopedics   Neurological: She is alert and oriented to person, place, and time. She has normal reflexes.  Skin: Skin is warm and dry.  Psychiatric: Her speech is normal and behavior is normal. Judgment normal. Her mood appears anxious. Thought content is not delusional. Cognition and memory are normal. She exhibits a depressed mood. She expresses no homicidal and no suicidal ideation.  The patient is depressed and very anxious       Assessment:    Generalized anxiety disorder with panic attacks - Plan: busPIRone (BUSPAR) 5 MG tablet  Depression, recurrent (HCC)  Asthma, cough variant  Fibromyalgia - Plan: HYDROcodone-acetaminophen (NORCO/VICODIN) 5-325 MG tablet      Plan:     1. Generalized anxiety disorder with panci atacks - add buspirone 5mg  up to TID as needed for acute anxiety. Continue sertraline as prescribed    2. Depression - continue sertraline 150mg  daily 3. Asthma, cough variant - use rescue inhaler as needed and as prescribed. Also recommend using  4. Fibromyalgia - continue use of lyrica and hydrocodone/APAP 5/325mg  tablets as before. Written rx for #30 hydrocodone given today.

## 2017-07-08 DIAGNOSIS — Z01818 Encounter for other preprocedural examination: Secondary | ICD-10-CM | POA: Diagnosis not present

## 2017-07-08 DIAGNOSIS — R609 Edema, unspecified: Secondary | ICD-10-CM | POA: Diagnosis not present

## 2017-07-08 DIAGNOSIS — R0602 Shortness of breath: Secondary | ICD-10-CM | POA: Diagnosis not present

## 2017-07-08 DIAGNOSIS — M25561 Pain in right knee: Secondary | ICD-10-CM | POA: Diagnosis not present

## 2017-07-08 DIAGNOSIS — I2699 Other pulmonary embolism without acute cor pulmonale: Secondary | ICD-10-CM | POA: Diagnosis not present

## 2017-07-08 DIAGNOSIS — M25661 Stiffness of right knee, not elsewhere classified: Secondary | ICD-10-CM | POA: Diagnosis not present

## 2017-08-09 NOTE — Progress Notes (Deleted)
08/10/2017 11:03 PM   Tracey Chandler 15-Apr-1966 [id number]  Referring provider: Lyndon Code, MD [address] [place], [place] [address]  No chief complaint on file.   HPI: Patient is a 52 year old Caucasian female with a history of multiple sclerosis, mixed incontinence and a history of hematuria who presents today for a 6-week follow-up after starting oxybutynin 15 mg XL.  Mixed urinary incontinence Patient has failed Myrbetriq, Vesicare and PTNS therapy.  She is currently on a course of oxybutynin 15 mg XL.  The patient has been experiencing urgency x *** (***), frequency x *** (***), not/is restricting fluids to avoid visits to the restroom ***, not/is engaging in toilet mapping, incontinence x *** (***) and nocturia x *** (***).    Her PVR is ***..  History of hematuria CTU performed in 2015 negative for malignancies.  Cystoscopy not completed.     Reviewed referral notes.    PMH: Past Medical History:  Diagnosis Date  . Cervical dysplasia   . COPD (chronic obstructive pulmonary disease) (HCC)   . Discharge from the vagina 11/19/2014  . Fibromyalgia   . H/O: obesity 04/06/2014  . Hematuria   . Herpes, genital   . Microscopic hematuria 11/23/2015  . Mixed incontinence   . MS (multiple sclerosis) (HCC)   . Multiple sclerosis (HCC) 06/03/2015  . OAB (overactive bladder)   . Obesity   . Pulmonary embolism (HCC)   . Snores     Surgical History: Past Surgical History:  Procedure Laterality Date  . ABDOMINAL HYSTERECTOMY    . Bladder mesh    . BLADDER SUSPENSION    . CHOLECYSTECTOMY    . Chonca batosa    . DILATION AND CURETTAGE OF UTERUS    . LASER ABLATION CONDYLOMA CERVICAL / VULVAR      Home Medications:  Allergies as of 08/10/2017      Reactions   Cefaclor Other (See Comments)   Other reaction(s): Unknown   Penicillins Hives, Other (See Comments)   Has patient had a PCN reaction causing immediate rash, facial/tongue/throat swelling, SOB or lightheadedness  with hypotension: Unknown Has patient had a PCN reaction causing severe rash involving mucus membranes or skin necrosis: No Has patient had a PCN reaction that required hospitalization: Unknown Has patient had a PCN reaction occurring within the last 10 years: No If all of the above answers are "NO", then may proceed with Cephalosporin use.   Phenytoin Other (See Comments)   Other reaction(s): Unknown      Medication List        Accurate as of 08/09/17 11:03 PM. Always use your most recent med list.          busPIRone 5 MG tablet Commonly known as:  BUSPAR Take 1 tablet (5 mg total) by mouth 3 (three) times daily.   CALCIUM-VITAMIN D3 PO Take 1 tablet by mouth daily.   fesoterodine 8 MG Tb24 tablet Commonly known as:  TOVIAZ Take 1 tablet (8 mg total) by mouth daily.   fluticasone 50 MCG/ACT nasal spray Commonly known as:  FLONASE Place 1 spray into both nostrils daily as needed for allergies.   HYDROcodone-acetaminophen 5-325 MG tablet Commonly known as:  NORCO/VICODIN Take 1 tablet by mouth every 8 (eight) hours as needed. for pain   LYRICA 150 MG capsule Generic drug:  pregabalin Take 1 capsule (150 mg total) by mouth 2 (two) times daily.   meclizine 25 MG tablet Commonly known as:  ANTIVERT TAKE 25 MG BY MOUTH  3 TIMES A DAY AS NEEDED FOR DIZZINESS   oxybutynin 15 MG 24 hr tablet Commonly known as:  DITROPAN XL Take 1 tablet (15 mg total) by mouth daily.   pantoprazole 40 MG tablet Commonly known as:  PROTONIX Take 1 tablet (40 mg total) by mouth daily.   PROAIR HFA 108 (90 Base) MCG/ACT inhaler Generic drug:  albuterol Inhale 2 puffs into the lungs every 6 (six) hours as needed for wheezing or shortness of breath.   sertraline 100 MG tablet Commonly known as:  ZOLOFT Take 1.5 tablets (150 mg total) by mouth daily.   valACYclovir 500 MG tablet Commonly known as:  VALTREX Take 1 tablet (500 mg total) by mouth daily.   Vitamin D3 2000 units  capsule Take 2,000 Units by mouth daily.   XARELTO 20 MG Tabs tablet Generic drug:  rivaroxaban Take 1 tablet (20 mg total) by mouth daily.       Allergies:  Allergies  Allergen Reactions  . Cefaclor Other (See Comments)    Other reaction(s): Unknown  . Penicillins Hives and Other (See Comments)    Has patient had a PCN reaction causing immediate rash, facial/tongue/throat swelling, SOB or lightheadedness with hypotension: Unknown Has patient had a PCN reaction causing severe rash involving mucus membranes or skin necrosis: No Has patient had a PCN reaction that required hospitalization: Unknown Has patient had a PCN reaction occurring within the last 10 years: No If all of the above answers are "NO", then may proceed with Cephalosporin use.   Marland Kitchen Phenytoin Other (See Comments)    Other reaction(s): Unknown    Family History: Family History  Problem Relation Age of Onset  . Hypertension Mother   . Hypertension Father   . Prostate cancer Paternal Uncle   . Kidney cancer Neg Hx   . Bladder Cancer Neg Hx     Social History:  reports that she has been smoking cigarettes.  She has been smoking about 0.50 packs per day. she has never used smokeless tobacco. She reports that she does not drink alcohol or use drugs.  ROS:                                        Physical Exam: There were no vitals taken for this visit.  Constitutional: Well nourished. Alert and oriented, No acute distress. HEENT: [place] AT, moist mucus membranes. Trachea midline, no masses. Cardiovascular: No clubbing, cyanosis, or edema. Respiratory: Normal respiratory effort, no increased work of breathing. GI: Abdomen is soft, non tender, non distended, no abdominal masses. Liver and spleen not palpable.  No hernias appreciated.  Stool sample for occult testing is not indicated.   GU: No CVA tenderness.  No bladder fullness or masses.  Patient with circumcised/uncircumcised phallus.  ***Foreskin easily retracted***  Urethral meatus is patent.  No penile discharge. No penile lesions or rashes. Scrotum without lesions, cysts, rashes and/or edema.  Testicles are located scrotally bilaterally. No masses are appreciated in the testicles. Left and right epididymis are normal. Rectal: Patient with  normal sphincter tone. Anus and perineum without scarring or rashes. No rectal masses are appreciated. Prostate is approximately *** grams, *** nodules are appreciated. Seminal vesicles are normal. Skin: No rashes, bruises or suspicious lesions. Lymph: No cervical or inguinal adenopathy. Neurologic: Grossly intact, no focal deficits, moving all 4 extremities. Psychiatric: Normal mood and affect.  Laboratory Data: Lab Results  Component Value Date   WBC 6.6 04/12/2014   HGB 12.5 04/12/2014   HCT 37.0 04/12/2014   MCV 91 04/12/2014   PLT 278 04/12/2014    Lab Results  Component Value Date   CREATININE 0.74 04/12/2014    No results found for: PSA  No results found for: TESTOSTERONE  No results found for: HGBA1C  No results found for: TSH  No results found for: CHOL, HDL, CHOLHDL, VLDL, LDLCALC  Lab Results  Component Value Date   AST 21 03/30/2014   Lab Results  Component Value Date   ALT 26 03/30/2014   No components found for: ALKALINEPHOPHATASE No components found for: BILIRUBINTOTAL  No results found for: ESTRADIOL  Urinalysis    Component Value Date/Time   COLORURINE Yellow 04/12/2014 1937   APPEARANCEUR Clear 11/30/2016 1327   LABSPEC 1.010 04/12/2014 1937   PHURINE 5.0 04/12/2014 1937   GLUCOSEU Negative 11/30/2016 1327   GLUCOSEU Negative 04/12/2014 1937   HGBUR 2+ 04/12/2014 1937   BILIRUBINUR Negative 11/30/2016 1327   BILIRUBINUR Negative 04/12/2014 1937   KETONESUR Negative 04/12/2014 1937   PROTEINUR Negative 11/30/2016 1327   PROTEINUR Negative 04/12/2014 1937   NITRITE Negative 11/30/2016 1327   NITRITE Negative 04/12/2014 1937    LEUKOCYTESUR Negative 11/30/2016 1327   LEUKOCYTESUR 1+ 04/12/2014 1937    I have reviewed the labs.   Pertinent Imaging: *** I have independently reviewed the films.    Assessment & Plan:  ***  1. Mixed incontinence  -***  2. History of hematuria  - UA today  No Follow-up on file.  These notes generated with voice recognition software. I apologize for typographical errors.  Michiel Cowboy, PA-C  [facility] [address] [place], [place] [address] [phone]

## 2017-08-10 ENCOUNTER — Ambulatory Visit: Payer: Medicare Other | Admitting: Nurse Practitioner

## 2017-08-10 ENCOUNTER — Ambulatory Visit: Payer: Medicare HMO | Admitting: Urology

## 2017-08-12 DIAGNOSIS — M25561 Pain in right knee: Secondary | ICD-10-CM | POA: Diagnosis not present

## 2017-08-13 ENCOUNTER — Ambulatory Visit: Payer: Medicare Other | Admitting: Nurse Practitioner

## 2017-08-16 ENCOUNTER — Other Ambulatory Visit: Payer: Self-pay

## 2017-08-16 ENCOUNTER — Encounter
Admission: RE | Admit: 2017-08-16 | Discharge: 2017-08-16 | Disposition: A | Discharge status: Home / Self Care | Payer: Medicare Other | Source: Ambulatory Visit | Attending: Orthopedic Surgery | Admitting: Orthopedic Surgery

## 2017-08-16 DIAGNOSIS — I10 Essential (primary) hypertension: Secondary | ICD-10-CM | POA: Diagnosis not present

## 2017-08-16 DIAGNOSIS — Z01810 Encounter for preprocedural cardiovascular examination: Secondary | ICD-10-CM | POA: Insufficient documentation

## 2017-08-16 DIAGNOSIS — Z01812 Encounter for preprocedural laboratory examination: Secondary | ICD-10-CM | POA: Insufficient documentation

## 2017-08-16 HISTORY — DX: Anxiety disorder, unspecified: F41.9

## 2017-08-16 HISTORY — DX: Gastro-esophageal reflux disease without esophagitis: K21.9

## 2017-08-16 HISTORY — DX: Acute embolism and thrombosis of unspecified deep veins of unspecified lower extremity: I82.409

## 2017-08-16 HISTORY — DX: Personal history of diseases of the blood and blood-forming organs and certain disorders involving the immune mechanism: Z86.2

## 2017-08-16 HISTORY — DX: Essential (primary) hypertension: I10

## 2017-08-16 HISTORY — DX: Cerebral infarction, unspecified: I63.9

## 2017-08-16 LAB — CBC
HCT: 43.4 % (ref 35.0–47.0)
HEMOGLOBIN: 14.5 g/dL (ref 12.0–16.0)
MCH: 30.2 pg (ref 26.0–34.0)
MCHC: 33.4 g/dL (ref 32.0–36.0)
MCV: 90.2 fL (ref 80.0–100.0)
Platelets: 145 10*3/uL — ABNORMAL LOW (ref 150–440)
RBC: 4.81 MIL/uL (ref 3.80–5.20)
RDW: 14.4 % (ref 11.5–14.5)
WBC: 5.2 10*3/uL (ref 3.6–11.0)

## 2017-08-16 NOTE — Patient Instructions (Signed)
Your procedure is scheduled on: Monday 08/23/17 Report to DAY SURGERY DEPARTMENT LOCATED ON 2ND FLOOR MEDICAL MALL ENTRANCE. To find out your arrival time please call [phone] between 1PM - 3PM on Friday 08/20/17.  Remember: Instructions that are not followed completely may result in serious medical risk, up to and including death, or upon the discretion of your surgeon and anesthesiologist your surgery may need to be rescheduled.     _X__ 1. Do not eat food after midnight the night before your procedure.                 No gum chewing or hard candies. You may drink clear liquids up to 2 hours                 before you are scheduled to arrive for your surgery- DO not drink clear                 liquids within 2 hours of the start of your surgery.                 Clear Liquids include:  water, apple juice without pulp, clear carbohydrate                 drink such as Clearfast of Gartorade, Black Coffee or Tea (Do not add                 anything to coffee or tea).  __X__2.  On the morning of surgery brush your teeth with toothpaste and water, you may rinse your mouth with mouthwash if you wish.  Do not swallow any              toothpaste of mouthwash.     _X__ 3.  No Alcohol for 24 hours before or after surgery.   _X__ 4.  Do Not Smoke or use e-cigarettes For 24 Hours Prior to Your Surgery.                 Do not use any chewable tobacco products for at least 6 hours prior to                 surgery.  ____  5.  Bring all medications with you on the day of surgery if instructed.   ____  6.  Notify your doctor if there is any change in your medical condition      (cold, fever, infections).     Do not wear jewelry, make-up, hairpins, clips or nail polish. Do not wear lotions, powders, or perfumes. You may wear deodorant. Do not shave 48 hours prior to surgery. Men may shave face and neck. Do not bring valuables to the hospital.    [facility] is not responsible for any belongings or  valuables.  Contacts, dentures or bridgework may not be worn into surgery. Leave your suitcase in the car. After surgery it may be brought to your room. For patients admitted to the hospital, discharge time is determined by your treatment team.   Patients discharged the day of surgery will not be allowed to drive home.   Please read over the following fact sheets that you were given:   MRSA Information   __X__ Take these medicines the morning of surgery with A SIP OF WATER:    1. BUSPIRONE  2. LYRICA  3. OXYBUTININ  4. PANTOPRAZOLE  5.  6.  ____ Fleet Enema (as directed)   __X__ Use CHG Soap as directed  __X__ Use inhalers on the day of surgery  ____ Stop metformin 2 days prior to surgery    ____ Take 1/2 of usual insulin dose the night before surgery. No insulin the morning          of surgery.   __X__ Stop Coumadin/Plavix/aspirin/XARELTO ACCORDING TO PREVIOUS INSTRUCTIONS  ____ Stop Anti-inflammatories on    ____ Stop supplements until after surgery.    ____ Bring C-Pap to the hospital.

## 2017-08-17 ENCOUNTER — Ambulatory Visit: Payer: Self-pay | Admitting: Orthopedic Surgery

## 2017-08-17 NOTE — Pre-Procedure Instructions (Signed)
Faxed note requesting H&P and Orders.  DOS is 08/23/17.

## 2017-08-20 ENCOUNTER — Ambulatory Visit: Payer: Self-pay | Admitting: Nurse Practitioner

## 2017-08-22 MED ORDER — CLINDAMYCIN PHOSPHATE 900 MG/50ML IV SOLN
900.0000 mg | INTRAVENOUS | Status: AC
  Administered 2017-08-23: 900 mg via INTRAVENOUS

## 2017-08-23 ENCOUNTER — Ambulatory Visit: Payer: Medicare Other | Admitting: Anesthesiology

## 2017-08-23 ENCOUNTER — Ambulatory Visit
Admission: RE | Admit: 2017-08-23 | Discharge: 2017-08-23 | Disposition: A | Discharge status: Home / Self Care | Payer: Medicare Other | Source: Ambulatory Visit | Attending: Orthopedic Surgery | Admitting: Orthopedic Surgery

## 2017-08-23 ENCOUNTER — Encounter: Payer: Self-pay | Admitting: *Deleted

## 2017-08-23 ENCOUNTER — Encounter
Admission: RE | Disposition: A | Discharge status: Home / Self Care | Payer: Self-pay | Source: Ambulatory Visit | Attending: Orthopedic Surgery

## 2017-08-23 ENCOUNTER — Other Ambulatory Visit: Payer: Self-pay

## 2017-08-23 DIAGNOSIS — S83281A Other tear of lateral meniscus, current injury, right knee, initial encounter: Secondary | ICD-10-CM | POA: Insufficient documentation

## 2017-08-23 DIAGNOSIS — Z862 Personal history of diseases of the blood and blood-forming organs and certain disorders involving the immune mechanism: Secondary | ICD-10-CM | POA: Insufficient documentation

## 2017-08-23 DIAGNOSIS — Z8673 Personal history of transient ischemic attack (TIA), and cerebral infarction without residual deficits: Secondary | ICD-10-CM | POA: Insufficient documentation

## 2017-08-23 DIAGNOSIS — Z8249 Family history of ischemic heart disease and other diseases of the circulatory system: Secondary | ICD-10-CM | POA: Insufficient documentation

## 2017-08-23 DIAGNOSIS — M23300 Other meniscus derangements, unspecified lateral meniscus, right knee: Secondary | ICD-10-CM | POA: Diagnosis not present

## 2017-08-23 DIAGNOSIS — Z833 Family history of diabetes mellitus: Secondary | ICD-10-CM | POA: Insufficient documentation

## 2017-08-23 DIAGNOSIS — M65861 Other synovitis and tenosynovitis, right lower leg: Secondary | ICD-10-CM | POA: Diagnosis not present

## 2017-08-23 DIAGNOSIS — F17200 Nicotine dependence, unspecified, uncomplicated: Secondary | ICD-10-CM | POA: Insufficient documentation

## 2017-08-23 DIAGNOSIS — M23303 Other meniscus derangements, unspecified medial meniscus, right knee: Secondary | ICD-10-CM | POA: Diagnosis not present

## 2017-08-23 DIAGNOSIS — K219 Gastro-esophageal reflux disease without esophagitis: Secondary | ICD-10-CM | POA: Diagnosis not present

## 2017-08-23 DIAGNOSIS — R51 Headache: Secondary | ICD-10-CM | POA: Diagnosis not present

## 2017-08-23 DIAGNOSIS — Z79899 Other long term (current) drug therapy: Secondary | ICD-10-CM | POA: Insufficient documentation

## 2017-08-23 DIAGNOSIS — Z880 Allergy status to penicillin: Secondary | ICD-10-CM | POA: Insufficient documentation

## 2017-08-23 DIAGNOSIS — Z90710 Acquired absence of both cervix and uterus: Secondary | ICD-10-CM | POA: Diagnosis not present

## 2017-08-23 DIAGNOSIS — M659 Synovitis and tenosynovitis, unspecified: Secondary | ICD-10-CM | POA: Insufficient documentation

## 2017-08-23 DIAGNOSIS — F329 Major depressive disorder, single episode, unspecified: Secondary | ICD-10-CM | POA: Insufficient documentation

## 2017-08-23 DIAGNOSIS — X58XXXA Exposure to other specified factors, initial encounter: Secondary | ICD-10-CM | POA: Diagnosis not present

## 2017-08-23 DIAGNOSIS — Z888 Allergy status to other drugs, medicaments and biological substances status: Secondary | ICD-10-CM | POA: Insufficient documentation

## 2017-08-23 DIAGNOSIS — M797 Fibromyalgia: Secondary | ICD-10-CM | POA: Diagnosis not present

## 2017-08-23 DIAGNOSIS — Y939 Activity, unspecified: Secondary | ICD-10-CM | POA: Diagnosis not present

## 2017-08-23 DIAGNOSIS — S83241A Other tear of medial meniscus, current injury, right knee, initial encounter: Secondary | ICD-10-CM | POA: Insufficient documentation

## 2017-08-23 DIAGNOSIS — Z7901 Long term (current) use of anticoagulants: Secondary | ICD-10-CM | POA: Insufficient documentation

## 2017-08-23 DIAGNOSIS — F419 Anxiety disorder, unspecified: Secondary | ICD-10-CM | POA: Insufficient documentation

## 2017-08-23 DIAGNOSIS — Z8261 Family history of arthritis: Secondary | ICD-10-CM | POA: Diagnosis not present

## 2017-08-23 DIAGNOSIS — M94261 Chondromalacia, right knee: Secondary | ICD-10-CM | POA: Diagnosis not present

## 2017-08-23 DIAGNOSIS — J449 Chronic obstructive pulmonary disease, unspecified: Secondary | ICD-10-CM | POA: Insufficient documentation

## 2017-08-23 DIAGNOSIS — I10 Essential (primary) hypertension: Secondary | ICD-10-CM | POA: Diagnosis not present

## 2017-08-23 HISTORY — PX: CHONDROPLASTY: SHX5177

## 2017-08-23 HISTORY — PX: KNEE ARTHROSCOPY WITH MEDIAL MENISECTOMY: SHX5651

## 2017-08-23 HISTORY — PX: SYNOVECTOMY: SHX5180

## 2017-08-23 SURGERY — ARTHROSCOPY, KNEE, WITH MEDIAL MENISCECTOMY
Anesthesia: General | Site: Knee | Laterality: Right | Wound class: Clean

## 2017-08-23 MED ORDER — MIDAZOLAM HCL 2 MG/2ML IJ SOLN
INTRAMUSCULAR | Status: DC | PRN
  Administered 2017-08-23: 2 mg via INTRAVENOUS

## 2017-08-23 MED ORDER — LACTATED RINGERS IV SOLN
INTRAVENOUS | Status: DC
  Administered 2017-08-23: 07:00:00 via INTRAVENOUS

## 2017-08-23 MED ORDER — FENTANYL CITRATE (PF) 100 MCG/2ML IJ SOLN
INTRAMUSCULAR | Status: DC | PRN
  Administered 2017-08-23 (×4): 25 ug via INTRAVENOUS

## 2017-08-23 MED ORDER — MIDAZOLAM HCL 2 MG/2ML IJ SOLN
INTRAMUSCULAR | Status: AC
  Filled 2017-08-23: qty 2

## 2017-08-23 MED ORDER — LACTATED RINGERS IV SOLN
INTRAVENOUS | Status: DC
  Administered 2017-08-23: 08:00:00 via INTRAVENOUS

## 2017-08-23 MED ORDER — LIDOCAINE HCL (CARDIAC) 20 MG/ML IV SOLN
INTRAVENOUS | Status: DC | PRN
  Administered 2017-08-23: 50 mg via INTRAVENOUS

## 2017-08-23 MED ORDER — BUPIVACAINE-EPINEPHRINE (PF) 0.25% -1:200000 IJ SOLN
INTRAMUSCULAR | Status: DC | PRN
  Administered 2017-08-23: 12 mL via PERINEURAL

## 2017-08-23 MED ORDER — EPINEPHRINE 30 MG/30ML IJ SOLN
INTRAMUSCULAR | Status: DC | PRN
  Administered 2017-08-23: 4 mL

## 2017-08-23 MED ORDER — FENTANYL CITRATE (PF) 100 MCG/2ML IJ SOLN
INTRAMUSCULAR | Status: AC
  Administered 2017-08-23: 25 ug via INTRAVENOUS
  Filled 2017-08-23: qty 2

## 2017-08-23 MED ORDER — DOCUSATE SODIUM 100 MG PO CAPS: 100.0000 mg | ORAL_CAPSULE | Freq: Every day | ORAL | 2 refills | Status: DC | PRN

## 2017-08-23 MED ORDER — CHLORHEXIDINE GLUCONATE 4 % EX LIQD
60.0000 mL | Freq: Once | CUTANEOUS | Status: AC
  Administered 2017-08-23: 4 via TOPICAL

## 2017-08-23 MED ORDER — PROPOFOL 10 MG/ML IV BOLUS
INTRAVENOUS | Status: DC | PRN
  Administered 2017-08-23: 140 mg via INTRAVENOUS
  Administered 2017-08-23: 60 mg via INTRAVENOUS

## 2017-08-23 MED ORDER — MORPHINE SULFATE (PF) 10 MG/ML IV SOLN
INTRAVENOUS | Status: DC | PRN
  Administered 2017-08-23: 10 mg via INTRA_ARTICULAR

## 2017-08-23 MED ORDER — HYDROCODONE-ACETAMINOPHEN 5-325 MG PO TABS
ORAL_TABLET | ORAL | Status: AC
  Administered 2017-08-23: 1 via ORAL
  Filled 2017-08-23: qty 1

## 2017-08-23 MED ORDER — FENTANYL CITRATE (PF) 100 MCG/2ML IJ SOLN
INTRAMUSCULAR | Status: AC
  Filled 2017-08-23: qty 2

## 2017-08-23 MED ORDER — PROPOFOL 10 MG/ML IV BOLUS
INTRAVENOUS | Status: AC
  Filled 2017-08-23: qty 20

## 2017-08-23 MED ORDER — HYDROCODONE-ACETAMINOPHEN 5-325 MG PO TABS: 1.0000 | ORAL_TABLET | ORAL | 0 refills | Status: DC | PRN

## 2017-08-23 MED ORDER — HYDROCODONE-ACETAMINOPHEN 5-325 MG PO TABS
1.0000 | ORAL_TABLET | ORAL | Status: DC | PRN
  Administered 2017-08-23 (×2): 1 via ORAL

## 2017-08-23 MED ORDER — ROPIVACAINE HCL 10 MG/ML IJ SOLN
INTRAMUSCULAR | Status: DC | PRN
  Administered 2017-08-23: 20 mL via EPIDURAL

## 2017-08-23 MED ORDER — HYDROCODONE-ACETAMINOPHEN 5-325 MG PO TABS
ORAL_TABLET | ORAL | Status: AC
  Filled 2017-08-23: qty 1

## 2017-08-23 MED ORDER — DEXAMETHASONE SODIUM PHOSPHATE 10 MG/ML IJ SOLN
INTRAMUSCULAR | Status: DC | PRN
  Administered 2017-08-23: 10 mg via INTRAVENOUS

## 2017-08-23 MED ORDER — CLINDAMYCIN PHOSPHATE 900 MG/50ML IV SOLN
INTRAVENOUS | Status: AC
  Filled 2017-08-23: qty 50

## 2017-08-23 MED ORDER — METHYLPREDNISOLONE ACETATE 40 MG/ML INJ SUSP (RADIOLOG
INTRAMUSCULAR | Status: DC | PRN
  Administered 2017-08-23: 40 mg via INTRA_ARTICULAR

## 2017-08-23 MED ORDER — FENTANYL CITRATE (PF) 100 MCG/2ML IJ SOLN
25.0000 ug | INTRAMUSCULAR | Status: DC | PRN
  Administered 2017-08-23 (×4): 25 ug via INTRAVENOUS

## 2017-08-23 MED ORDER — ONDANSETRON HCL 4 MG/2ML IJ SOLN: 4.0000 mg | Freq: Once | INTRAMUSCULAR | Status: DC | PRN

## 2017-08-23 MED ORDER — ONDANSETRON HCL 4 MG/2ML IJ SOLN
INTRAMUSCULAR | Status: DC | PRN
  Administered 2017-08-23: 4 mg via INTRAVENOUS

## 2017-08-23 SURGICAL SUPPLY — 33 items
ADAPTER IRRIG TUBE 2 SPIKE SOL (ADAPTER) ×6 IMPLANT
BASIN GRAD PLASTIC 32OZ STRL (MISCELLANEOUS) ×3 IMPLANT
BLADE FULL RADIUS 3.5 (BLADE) IMPLANT
BLADE INCISOR PLUS 4.5 (BLADE) IMPLANT
BLADE SHAVER 4.5 DBL SERAT CV (CUTTER) ×6 IMPLANT
BLADE SURG SZ11 CARB STEEL (BLADE) ×3 IMPLANT
BRUSH SCRUB EZ  4% CHG (MISCELLANEOUS) ×2
BRUSH SCRUB EZ 4% CHG (MISCELLANEOUS) ×4 IMPLANT
CHLORAPREP W/TINT 26ML (MISCELLANEOUS) ×3 IMPLANT
COOLER POLAR GLACIER W/PUMP (MISCELLANEOUS) IMPLANT
DRAPE IMP U-DRAPE 54X76 (DRAPES) ×3 IMPLANT
GAUZE PETRO XEROFOAM 1X8 (MISCELLANEOUS) ×3 IMPLANT
GAUZE SPONGE 4X4 12PLY STRL (GAUZE/BANDAGES/DRESSINGS) ×3 IMPLANT
GLOVE INDICATOR 8.0 STRL GRN (GLOVE) ×9 IMPLANT
GLOVE SURG ORTHO 8.0 STRL STRW (GLOVE) ×9 IMPLANT
GOWN STRL REUS W/ TWL LRG LVL3 (GOWN DISPOSABLE) ×2 IMPLANT
GOWN STRL REUS W/ TWL XL LVL3 (GOWN DISPOSABLE) ×4 IMPLANT
GOWN STRL REUS W/TWL LRG LVL3 (GOWN DISPOSABLE) ×1
GOWN STRL REUS W/TWL XL LVL3 (GOWN DISPOSABLE) ×2
IV LACTATED RINGER IRRG 3000ML (IV SOLUTION) ×2
IV LR IRRIG 3000ML ARTHROMATIC (IV SOLUTION) ×4 IMPLANT
KIT TURNOVER KIT A (KITS) ×3 IMPLANT
MANIFOLD NEPTUNE II (INSTRUMENTS) ×3 IMPLANT
MAT BLUE FLOOR 46X72 FLO (MISCELLANEOUS) ×3 IMPLANT
NEEDLE SPNL 20GX3.5 QUINCKE YW (NEEDLE) ×3 IMPLANT
PACK ARTHROSCOPY KNEE (MISCELLANEOUS) ×3 IMPLANT
PAD WRAPON POLAR KNEE (MISCELLANEOUS) IMPLANT
SUT ETHILON 4-0 (SUTURE) ×1
SUT ETHILON 4-0 FS2 18XMFL BLK (SUTURE) ×2
SUTURE ETHLN 4-0 FS2 18XMF BLK (SUTURE) ×2 IMPLANT
TUBING ARTHRO INFLOW-ONLY STRL (TUBING) ×3 IMPLANT
WAND HAND CNTRL MULTIVAC 90 (MISCELLANEOUS) ×3 IMPLANT
WRAPON POLAR PAD KNEE (MISCELLANEOUS)

## 2017-08-23 NOTE — H&P (Signed)
The patient has been re-examined, and the chart reviewed, and there have been no interval changes to the documented history and physical.  Plan a right knee arthroscopy today.  Anesthesia is not consulted regarding a peripheral nerve block for post-operative pain.  The risks, benefits, and alternatives have been discussed at length, and the patient is willing to proceed.     

## 2017-08-23 NOTE — Anesthesia Procedure Notes (Signed)
Procedure Name: LMA Insertion Date/Time: 08/23/2017 7:48 AM Performed by: Eduardo Osier, CRNA Pre-anesthesia Checklist: Patient identified, Emergency Drugs available, Suction available, Patient being monitored and Timeout performed Patient Re-evaluated:Patient Re-evaluated prior to induction Oxygen Delivery Method: Circle system utilized Preoxygenation: Pre-oxygenation with 100% oxygen Induction Type: IV induction Tube type: Oral Number of attempts: 1 Placement Confirmation: positive ETCO2,  CO2 detector and breath sounds checked- equal and bilateral Tube secured with: Tape

## 2017-08-23 NOTE — Anesthesia Postprocedure Evaluation (Signed)
Anesthesia Post Note  Patient: Tracey Chandler  Procedure(s) Performed: KNEE ARTHROSCOPY WITH MEDIAL MENISECTOMY (Right Knee) SYNOVECTOMY (Right Knee) CHONDROPLASTY (Right )  Patient location during evaluation: PACU Anesthesia Type: General Level of consciousness: awake and alert Pain management: pain level controlled Vital Signs Assessment: post-procedure vital signs reviewed and stable Respiratory status: spontaneous breathing and respiratory function stable Cardiovascular status: stable Anesthetic complications: no     Last Vitals:  Vitals:   08/23/17 0609 08/23/17 0848  BP: 130/82 106/61  Pulse: 68 75  Resp: 14 20  Temp: (!) 36.4 C 36.6 C  SpO2: 99% 95%    Last Pain:  Vitals:   08/23/17 0848  TempSrc:   PainSc: Asleep                 KEPHART,WILLIAM K

## 2017-08-23 NOTE — Anesthesia Post-op Follow-up Note (Signed)
Anesthesia QCDR form completed.        

## 2017-08-23 NOTE — Anesthesia Preprocedure Evaluation (Signed)
Anesthesia Evaluation  Patient identified by MRN, date of birth, ID band Patient awake    Reviewed: Allergy & Precautions, NPO status , Patient's Chart, lab work & pertinent test results  History of Anesthesia Complications Negative for: history of anesthetic complications  Airway Mallampati: III       Dental   Pulmonary neg sleep apnea, COPD,  COPD inhaler, former smoker,           Cardiovascular hypertension (off meds for 10 yrs),      Neuro/Psych Seizures - (with CVA, none for 30 yrs),  Anxiety CVA (R side weakness, resolved), No Residual Symptoms    GI/Hepatic Neg liver ROS, GERD  Medicated and Controlled,  Endo/Other  neg diabetes  Renal/GU negative Renal ROS     Musculoskeletal   Abdominal   Peds  Hematology "gene problem" clots easily   Anesthesia Other Findings   Reproductive/Obstetrics                            Anesthesia Physical Anesthesia Plan  ASA: III  Anesthesia Plan: General   Post-op Pain Management:    Induction: Intravenous  PONV Risk Score and Plan: 2 and Dexamethasone and Ondansetron  Airway Management Planned: LMA  Additional Equipment:   Intra-op Plan:   Post-operative Plan:   Informed Consent: I have reviewed the patients History and Physical, chart, labs and discussed the procedure including the risks, benefits and alternatives for the proposed anesthesia with the patient or authorized representative who has indicated his/her understanding and acceptance.     Plan Discussed with:   Anesthesia Plan Comments:         Anesthesia Quick Evaluation

## 2017-08-23 NOTE — Op Note (Addendum)
  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 female with a diagnosis of TEAR OF MEDIAL MENISCUS who failed conservative management and elected for surgical management.    The risks benefits and alternatives were discussed with the patient preoperatively including the risks of infection, bleeding, nerve injury, knee stiffness, persistent pain, osteoarthritis and the need for further surgery. Medical  risks include DVT and pulmonary embolism, myocardial infarction, stroke, pneumonia, respiratory failure and death. The patient understood these risks and wished to proceed.   OPERATIVE FINDINGS: Tear of the posterior horn of the medial meniscus, degenerative tearing of the lateral meniscus, Grade 2 chondromalacia of the medial compartment, synovitic synovitis of all 3 compartments. ACL and PCL were intact. Normal patellar alignment. No loose bodies were identified within the medial or lateral gutters.  OPERATIVE PROCEDURE: Patient was met in the preoperative area. The operative extremity was signed with my initials according the hospital's correct site of surgery protocol.  The patient was brought to the operating room where they was placed supine on the operative table. General anesthesia was administered. The patient was prepped and draped in a sterile fashion.  A timeout was performed to verify the patient's name, date of birth, medical record number, correct site of surgery correct procedure to be performed. It was also used to verify the patient  received antibiotics that all appropriate instruments, and radiographic studies were available in the room. Once all in attendance were in agreement, the case began.  Proposed arthroscopy incisions were drawn out with a surgical marker. These were pre-injected with 0.5% marcaine with epinephrine. An 11 blade was used to establish an inferior lateral and inferomedial portals. The inferomedial portal was created using a 18-gauge spinal needle under direct visualization.  A full diagnostic examination of the knee was performed including the suprapatellar pouch, patellofemoral joint, medial lateral compartments as well as the medial lateral gutters, the intercondylar notch in the posterior knee.  Patient had the medial and lateral meniscal tear treated with a 4-0 resector shaver blade and straight duckbill basket. The meniscus was debrided until a stable rim was achieved. A chondroplasty of the medial femoral condyle, trochlea and undersurface of patella was also performed using a 4-0 resector shaver blade. A partial synovectomy was also performed using a 4-0 resector shaver blade in all three compartments.  The knee was then copiously lavaged. All arthroscopic instruments were removed. The 2 arthroscopy portals were closed with 4-0 nylon. A dry sterile and compressive dressing was applied. The patient was brought to the PACU in stable condition. I was scrubbed and present for the entire case and all sharp and instrument counts were correct at the conclusion the case. I spoke with the patient's family postoperatively to let them know the case was performed without complication and the patient was stable in the recovery room.  Kurtis Bushman, MD

## 2017-08-23 NOTE — Transfer of Care (Signed)
Immediate Anesthesia Transfer of Care Note  Patient: Tracey Chandler  Procedure(s) Performed: KNEE ARTHROSCOPY WITH MEDIAL MENISECTOMY (Right Knee) SYNOVECTOMY (Right Knee) CHONDROPLASTY (Right )  Patient Location: PACU  Anesthesia Type:General  Level of Consciousness: awake  Airway & Oxygen Therapy: Patient Spontanous Breathing and Patient connected to face mask oxygen  Post-op Assessment: Report given to RN and Post -op Vital signs reviewed and stable  Post vital signs: Reviewed  Last Vitals:  Vitals:   08/23/17 0609 08/23/17 0848  BP: 130/82 106/61  Pulse: 68 75  Resp: 14 20  Temp: (!) 36.4 C   SpO2: 99% 95%    Last Pain:  Vitals:   08/23/17 0848  TempSrc:   PainSc: (P) Asleep         Complications: No apparent anesthesia complications

## 2017-08-23 NOTE — Discharge Instructions (Addendum)
Post Op Home Instructions for Knee Arthroscopy  1) Do not sit for longer than 1 hour at a time with your leg dangling down.  You should have your legs elevated (higher than your heart) in a recliner chair or couch.  2) You may be up walking around as tolerated but should take periodic breaks to elevate your legs.  Discontinue use of crutches when you feel you are able to walk without pain or a limp.  3) Work on gentle bending and straightening of the knee.  4) You may remove the Ace wrap and dressings two days after surgery.  Place band aids over the incision sites.  5) You may shower after you remove the surgical dressing.  You do not need to cover the incision with plastic wrap.  The incision can get wet, but do not submerge under water.  After your sutures have been removed, you should wait 24 hours before submerging incision under water.  6) Pain medication can cause constipation.  You should increase your fluid intake, increase your intake of high fiber foods and/or take Metamucil as needed for constipation.  7) Continue your physical therapy exercises, as shown at the office, at least twice daily.  You should set up outpatient physical therapy and start within the first week after surgery.  8) Continue to use your Polar Pack continuously for 2-3 days after surgery.  After you remove the surgical dressing, it is a good idea to use your Polar Pack or ice pack for 30 minutes after doing your exercises to reduce swelling.  9) Do not be surprised if you have increased pain at night.  This usually means you have been a little too active during the day and need to reduce your activities.  10) If you develop lower extremity swelling that does not improve after a night of elevation, please call the office.  This could be an early sign of a blood clot.  Please call with any questions at [phone]       Incentive Spirometer An incentive spirometer is a tool that measures how well you are  filling your lungs with each breath. This tool can help keep your lungs clear and active. Taking long, deep breaths may help reverse or decrease the chance of developing breathing (pulmonary) problems, especially infection, following:  Surgery of the chest or abdomen.  Surgery if you have a history of smoking or a lung problem.  A long period of time when you are unable to move or be active.  If the spirometer includes an indicator to show your best effort, your health care provider or respiratory therapist will help you set a goal. Keep a log of your progress if directed by your health care provider. What are the risks?  Breathing too quickly may cause dizziness or cause you to pass out. Take your time so you do not get dizzy or lightheaded.  If you are in pain, you may need to take or ask for pain medicine before doing incentive spirometry. It is harder to take a deep breath if you are having pain. How to use your incentive spirometer 1. Sit on the edge of your bed if possible, or sit up as far as you can in bed or on a chair. 2. Hold the incentive spirometer in an upright position. 3. Breathe out normally. 4. Place the mouthpiece in your mouth and seal your lips tightly around it. 5. Breathe in slowly and as deeply as possible, raising the piston  or the ball toward the top of the column. 6. Hold your breath for 3-5 seconds or for as long as possible. Allow the piston or ball to fall to the bottom of the column. 7. Remove the mouthpiece from your mouth and breathe out normally. 8. The spirometer may include an indicator to show your best effort. Use the indicator as a goal to work toward during each repetition. 9. Rest for a few seconds and repeat this at least 10 times, every 1-2 hours when you are awake. Take your time and take a few normal breaths between deep breaths. Breathing too quickly may cause dizziness or cause you to pass out. Take your time so you do not get dizzy or  lightheaded. 10. After each set of 10 deep breaths, practice coughing to be sure your lungs are clear. If you had a surgical cut (incision) made during surgery, support your incision when coughing by placing a pillow or rolled-up towel firmly against it. Once you are able to get out of bed, walk around indoors and cough well. You may stop using the incentive spirometer when instructed by your health care provider. Contact a health care provider if:  You are having difficulty using the spirometer.  You have trouble using the spirometer as often as instructed.  Your pain medicine is not giving enough relief while using the spirometer.  You have a fever.  You develop shortness of breath. Get help right away if:  You develop a cough with bloody sputum.  You develop worsening pain, redness, or discharge at or near the incision site. This information is not intended to replace advice given to you by your health care provider. Make sure you discuss any questions you have with your health care provider. Document Released: 11/16/2006 Document Revised: 03/30/2016 Document Reviewed: 02/12/2014 Elsevier Interactive Patient Education  [employer].

## 2017-08-31 ENCOUNTER — Ambulatory Visit (INDEPENDENT_AMBULATORY_CARE_PROVIDER_SITE_OTHER): Payer: Medicare Other | Admitting: Urology

## 2017-08-31 ENCOUNTER — Encounter: Payer: Self-pay | Admitting: Urology

## 2017-08-31 VITALS — BP 125/83 | HR 78 | Ht 64.0 in | Wt 223.0 lb

## 2017-08-31 DIAGNOSIS — Z87448 Personal history of other diseases of urinary system: Secondary | ICD-10-CM | POA: Diagnosis not present

## 2017-08-31 DIAGNOSIS — N390 Urinary tract infection, site not specified: Secondary | ICD-10-CM | POA: Diagnosis not present

## 2017-08-31 DIAGNOSIS — N3946 Mixed incontinence: Secondary | ICD-10-CM | POA: Diagnosis not present

## 2017-08-31 DIAGNOSIS — N342 Other urethritis: Secondary | ICD-10-CM

## 2017-08-31 LAB — URINALYSIS, COMPLETE
BILIRUBIN UA: NEGATIVE
Glucose, UA: NEGATIVE
Nitrite, UA: NEGATIVE
PH UA: 5 (ref 5.0–7.5)
Protein, UA: NEGATIVE
Specific Gravity, UA: >1.03 — ABNORMAL HIGH (ref 1.005–1.030)
Urobilinogen, Ur: 0.2 mg/dL (ref 0.2–1.0)

## 2017-08-31 LAB — MICROSCOPIC EXAMINATION

## 2017-08-31 LAB — BLADDER SCAN AMB NON-IMAGING: Scan Result: 26

## 2017-08-31 MED ORDER — SULFAMETHOXAZOLE-TRIMETHOPRIM 800-160 MG PO TABS: 1.0000 | ORAL_TABLET | Freq: Two times a day (BID) | ORAL | 0 refills | Status: DC

## 2017-08-31 NOTE — Progress Notes (Signed)
08/31/2017 4:21 PM   Tracey Chandler 08/04/1965 [id number]  Referring provider: Lyndon Code, MD [address] [place], [place] [address]  Chief Complaint  Patient presents with  . Follow-up    HPI: Patient is a 52 year old Caucasian female with a history of multiple sclerosis, mixed incontinence and a history of hematuria who presents today for a 6-week follow-up after starting oxybutynin 15 mg XL.  Mixed urinary incontinence Patient has failed Myrbetriq, Vesicare and PTNS therapy.  She is currently on a course of oxybutynin 15 mg XL.  The patient has been experiencing urgency x 0-3, frequency x 4-7 (stable), not restricting fluids to avoid visits to the restroom, is engaging in toilet mapping, incontinence x 4-7 (worse) and nocturia x 0-3 (stable).    Her PVR is 24 mL.    History of hematuria CTU and cystoscopy performed in 2015 were negative for malignancies.    She states she is still having vaginal discharge and a foul odor.  She is not sure if the odor is from the urine or the vaginal discharge.  She is sexually active and last had intercourse two days ago.    She has been having left flank pain that has been occurring on an intermittent basis.  It was initially 5/10, but it has been improving.    UA today is positive for 11-30 WBC's, 3-10 RBC's and moderate bacteria.    PMH: Past Medical History:  Diagnosis Date  . Anxiety   . Cervical dysplasia   . COPD (chronic obstructive pulmonary disease) (HCC)   . Discharge from the vagina 11/19/2014  . DVT (deep venous thrombosis) (HCC)   . Fibromyalgia   . GERD (gastroesophageal reflux disease)   . H/O: obesity 04/06/2014  . Hematuria   . Herpes, genital   . History of blood clotting disorder   . Hypertension    lost weight/no meds for 10 years  . Microscopic hematuria 11/23/2015  . Mixed incontinence   . MS (multiple sclerosis) (HCC)   . Multiple sclerosis (HCC) 06/03/2015  . OAB (overactive bladder)   . Obesity   .  Pulmonary embolism (HCC)   . Snores   . Stroke [facility])     Surgical History: Past Surgical History:  Procedure Laterality Date  . ABDOMINAL HYSTERECTOMY    . Bladder mesh    . BLADDER SUSPENSION    . CHOLECYSTECTOMY    . Chonca batosa    . CHONDROPLASTY Right 08/23/2017   Procedure: CHONDROPLASTY;  Surgeon: Lyndle Herrlich, MD;  Location: [facility] ORS;  Service: Orthopedics;  Laterality: Right;  . DILATION AND CURETTAGE OF UTERUS    . KNEE ARTHROSCOPY WITH MEDIAL MENISECTOMY Right 08/23/2017   Procedure: KNEE ARTHROSCOPY WITH MEDIAL MENISECTOMY;  Surgeon: Lyndle Herrlich, MD;  Location: [facility] ORS;  Service: Orthopedics;  Laterality: Right;  . LASER ABLATION CONDYLOMA CERVICAL / VULVAR    . SYNOVECTOMY Right 08/23/2017   Procedure: SYNOVECTOMY;  Surgeon: Lyndle Herrlich, MD;  Location: [facility] ORS;  Service: Orthopedics;  Laterality: Right;    Home Medications:  Allergies as of 08/31/2017      Reactions   Cefaclor Other (See Comments)    swells face and turns red   Penicillins Hives, Other (See Comments)   Has patient had a PCN reaction causing immediate rash, facial/tongue/throat swelling, SOB or lightheadedness with hypotension: Unknown Has patient had a PCN reaction causing severe rash involving mucus membranes or skin necrosis: No Has patient had a PCN reaction that required  hospitalization: Unknown Has patient had a PCN reaction occurring within the last 10 years: No If all of the above answers are "NO", then may proceed with Cephalosporin use.   Phenytoin Hives   Other reaction(s): Unknown      Medication List        Accurate as of 08/31/17  4:21 PM. Always use your most recent med list.          busPIRone 5 MG tablet Commonly known as:  BUSPAR Take 1 tablet (5 mg total) by mouth 3 (three) times daily.   CALCIUM-VITAMIN D3 PO Take 1 tablet by mouth daily.   docusate sodium 100 MG capsule Commonly known as:  COLACE Take 1 capsule (100 mg total) by mouth daily as needed.     fesoterodine 8 MG Tb24 tablet Commonly known as:  TOVIAZ Take 1 tablet (8 mg total) by mouth daily.   fluticasone 50 MCG/ACT nasal spray Commonly known as:  FLONASE Place 1 spray into both nostrils daily as needed for allergies.   HYDROcodone-acetaminophen 5-325 MG tablet Commonly known as:  NORCO/VICODIN Take 1 tablet by mouth every 4 (four) hours as needed for moderate pain.   LOVENOX [place] Inject into the skin daily.   LYRICA 150 MG capsule Generic drug:  pregabalin Take 1 capsule (150 mg total) by mouth 2 (two) times daily.   meclizine 25 MG tablet Commonly known as:  ANTIVERT TAKE 25 MG BY MOUTH 3 TIMES A DAY AS NEEDED FOR DIZZINESS   oxybutynin 15 MG 24 hr tablet Commonly known as:  DITROPAN XL Take 1 tablet (15 mg total) by mouth daily.   pantoprazole 40 MG tablet Commonly known as:  PROTONIX Take 1 tablet (40 mg total) by mouth daily.   PROAIR HFA 108 (90 Base) MCG/ACT inhaler Generic drug:  albuterol Inhale 2 puffs into the lungs every 6 (six) hours as needed for wheezing or shortness of breath.   sertraline 100 MG tablet Commonly known as:  ZOLOFT Take 1.5 tablets (150 mg total) by mouth daily.   valACYclovir 500 MG tablet Commonly known as:  VALTREX Take 1 tablet (500 mg total) by mouth daily.   Vitamin D3 2000 units capsule Take 2,000 Units by mouth daily.   XARELTO 20 MG Tabs tablet Generic drug:  rivaroxaban Take 1 tablet (20 mg total) by mouth daily.       Allergies:  Allergies  Allergen Reactions  . Cefaclor Other (See Comments)     swells face and turns red  . Penicillins Hives and Other (See Comments)    Has patient had a PCN reaction causing immediate rash, facial/tongue/throat swelling, SOB or lightheadedness with hypotension: Unknown Has patient had a PCN reaction causing severe rash involving mucus membranes or skin necrosis: No Has patient had a PCN reaction that required hospitalization: Unknown Has patient had a PCN reaction  occurring within the last 10 years: No If all of the above answers are "NO", then may proceed with Cephalosporin use.   Marland Kitchen Phenytoin Hives    Other reaction(s): Unknown    Family History: Family History  Problem Relation Age of Onset  . Hypertension Mother   . Hypertension Father   . Prostate cancer Paternal Uncle   . Kidney cancer Neg Hx   . Bladder Cancer Neg Hx     Social History:  reports that she quit smoking about 2 weeks ago. Her smoking use included cigarettes. She smoked 0.50 packs per day. she has never used smokeless tobacco. She  reports that she does not drink alcohol or use drugs.  ROS: UROLOGY Frequent Urination?: Yes Hard to postpone urination?: Yes Burning/pain with urination?: No Get up at night to urinate?: Yes Leakage of urine?: Yes Urine stream starts and stops?: Yes Trouble starting stream?: No Do you have to strain to urinate?: No Blood in urine?: No Urinary tract infection?: No Sexually transmitted disease?: No Injury to kidneys or bladder?: No Painful intercourse?: No Weak stream?: No Currently pregnant?: No Vaginal bleeding?: Yes Last menstrual period?: n  Gastrointestinal Nausea?: No Vomiting?: No Indigestion/heartburn?: No Diarrhea?: No Constipation?: No  Constitutional Fever: No Night sweats?: No Weight loss?: No Fatigue?: No  Skin Skin rash/lesions?: No Itching?: No  Eyes Blurred vision?: No Double vision?: No  Ears/Nose/Throat Sore throat?: No Sinus problems?: No  Hematologic/Lymphatic Swollen glands?: No Easy bruising?: No  Cardiovascular Leg swelling?: No Chest pain?: No  Respiratory Cough?: No Shortness of breath?: No  Endocrine Excessive thirst?: No  Musculoskeletal Back pain?: No Joint pain?: No  Neurological Headaches?: No Dizziness?: No  Psychologic Depression?: No Anxiety?: No  Physical Exam: BP 125/83   Pulse 78   Ht 5\' 4"  (1.626 m)   Wt 223 lb (101.2 kg)   BMI 38.28 kg/m     Constitutional: Well nourished. Alert and oriented, No acute distress. HEENT: [place] AT, moist mucus membranes. Trachea midline, no masses. Cardiovascular: No clubbing, cyanosis, or edema. Respiratory: Normal respiratory effort, no increased work of breathing. GI: Abdomen is soft, non tender, non distended, no abdominal masses. Liver and spleen not palpable.  No hernias appreciated.  Stool sample for occult testing is not indicated.   GU: No CVA tenderness.  No bladder fullness or masses.  Normal external genitalia, normal pubic hair distribution, no lesions.  Normal urethral meatus, no lesions, no prolapse, no discharge.   No urethral masses.  Tenderness is noted in the area of the sling.  ? Skene gland tenderness.  No bladder fullness, tenderness or masses. Normal vagina mucosa, good estrogen effect, no discharge, no lesions, good pelvic support, no cystocele or rectocele noted.  Cervix and uterus are surgically absent.  No adnexal/parametria masses or tenderness noted.  Anus and perineum are without rashes or lesions.    Skin: No rashes, bruises or suspicious lesions. Lymph: No cervical or inguinal adenopathy. Neurologic: Grossly intact, no focal deficits, moving all 4 extremities. Psychiatric: Normal mood and affect.  Laboratory Data: Lab Results  Component Value Date   WBC 5.2 08/16/2017   HGB 14.5 08/16/2017   HCT 43.4 08/16/2017   MCV 90.2 08/16/2017   PLT 145 (L) 08/16/2017    Lab Results  Component Value Date   CREATININE 0.74 04/12/2014    No results found for: PSA  No results found for: TESTOSTERONE  No results found for: HGBA1C  No results found for: TSH  No results found for: CHOL, HDL, CHOLHDL, VLDL, LDLCALC  Lab Results  Component Value Date   AST 21 03/30/2014   Lab Results  Component Value Date   ALT 26 03/30/2014   No components found for: ALKALINEPHOPHATASE No components found for: BILIRUBINTOTAL  No results found for: ESTRADIOL  Urinalysis 11-30  WBC's.  3-10 RBC's.  Moderate bacteria.  See Epic.    I have reviewed the labs.   Pertinent Imaging: Results for FUMI, GUADRON (MRN [id number]) as of 08/31/2017 16:04  Ref. Range 08/31/2017 00:00  Scan Result Unknown 26     Assessment & Plan:    1. Mixed incontinence  -continue oxybutynin  and add Myrbetriq 25 mg daily  -RTC in 3 weeks for OAB questionnaire and PVR  2. History of hematuria  - UA today positive for 3-10 RBC's, but may be due to vaginal discharge  - RTC in 3 weeks - we will obtain a CATH UA upon return  3. Skene's gland infection  - start Septra DS - send the UA for GC/chlamydia test  Return in about 3 weeks (around 09/21/2017) for PVR and OAB questionnaire and CATH UA.  These notes generated with voice recognition software. I apologize for typographical errors.  Michiel Cowboy, PA-C  [facility] [address] [place], [place] [address] [phone]

## 2017-09-01 LAB — GC/CHLAMYDIA PROBE AMP
CHLAMYDIA, DNA PROBE: NEGATIVE
NEISSERIA GONORRHOEAE BY PCR: NEGATIVE

## 2017-09-12 ENCOUNTER — Encounter: Payer: Self-pay | Admitting: Emergency Medicine

## 2017-09-12 ENCOUNTER — Other Ambulatory Visit: Payer: Self-pay

## 2017-09-12 ENCOUNTER — Emergency Department: Payer: Medicare Other

## 2017-09-12 ENCOUNTER — Emergency Department
Admission: EM | Admit: 2017-09-12 | Discharge: 2017-09-12 | Disposition: A | Discharge status: Home / Self Care | Payer: Medicare Other | Attending: Emergency Medicine | Admitting: Emergency Medicine

## 2017-09-12 DIAGNOSIS — M7121 Synovial cyst of popliteal space [Baker], right knee: Secondary | ICD-10-CM | POA: Diagnosis not present

## 2017-09-12 DIAGNOSIS — G35 Multiple sclerosis: Secondary | ICD-10-CM | POA: Diagnosis not present

## 2017-09-12 DIAGNOSIS — Z79899 Other long term (current) drug therapy: Secondary | ICD-10-CM | POA: Insufficient documentation

## 2017-09-12 DIAGNOSIS — Z7901 Long term (current) use of anticoagulants: Secondary | ICD-10-CM | POA: Diagnosis not present

## 2017-09-12 DIAGNOSIS — I10 Essential (primary) hypertension: Secondary | ICD-10-CM | POA: Insufficient documentation

## 2017-09-12 DIAGNOSIS — M7989 Other specified soft tissue disorders: Secondary | ICD-10-CM | POA: Diagnosis not present

## 2017-09-12 DIAGNOSIS — M79661 Pain in right lower leg: Secondary | ICD-10-CM | POA: Diagnosis not present

## 2017-09-12 DIAGNOSIS — J449 Chronic obstructive pulmonary disease, unspecified: Secondary | ICD-10-CM | POA: Diagnosis not present

## 2017-09-12 DIAGNOSIS — Z8673 Personal history of transient ischemic attack (TIA), and cerebral infarction without residual deficits: Secondary | ICD-10-CM | POA: Insufficient documentation

## 2017-09-12 DIAGNOSIS — Z87891 Personal history of nicotine dependence: Secondary | ICD-10-CM | POA: Insufficient documentation

## 2017-09-12 DIAGNOSIS — Z9889 Other specified postprocedural states: Secondary | ICD-10-CM

## 2017-09-12 DIAGNOSIS — F419 Anxiety disorder, unspecified: Secondary | ICD-10-CM | POA: Insufficient documentation

## 2017-09-12 DIAGNOSIS — Z86718 Personal history of other venous thrombosis and embolism: Secondary | ICD-10-CM | POA: Diagnosis not present

## 2017-09-12 DIAGNOSIS — Z9049 Acquired absence of other specified parts of digestive tract: Secondary | ICD-10-CM | POA: Insufficient documentation

## 2017-09-12 DIAGNOSIS — M25561 Pain in right knee: Secondary | ICD-10-CM | POA: Diagnosis present

## 2017-09-12 LAB — PROTIME-INR
INR: 2
Prothrombin Time: 22.5 seconds — ABNORMAL HIGH (ref 11.4–15.2)

## 2017-09-12 LAB — BASIC METABOLIC PANEL
ANION GAP: 8 (ref 5–15)
BUN: 15 mg/dL (ref 6–20)
CO2: 27 mmol/L (ref 22–32)
Calcium: 9.5 mg/dL (ref 8.9–10.3)
Chloride: 104 mmol/L (ref 101–111)
Creatinine, Ser: 0.75 mg/dL (ref 0.44–1.00)
GFR calc non Af Amer: >60 mL/min (ref >60)
GLUCOSE: 106 mg/dL — AB (ref 65–99)
Potassium: 3.9 mmol/L (ref 3.5–5.1)
Sodium: 139 mmol/L (ref 135–145)

## 2017-09-12 LAB — CBC WITH DIFFERENTIAL/PLATELET
BASOS ABS: 0 10*3/uL (ref 0–0.1)
BASOS PCT: 1 %
Eosinophils Absolute: 0.1 10*3/uL (ref 0–0.7)
Eosinophils Relative: 1 %
HEMATOCRIT: 44.6 % (ref 35.0–47.0)
HEMOGLOBIN: 15.1 g/dL (ref 12.0–16.0)
LYMPHS PCT: 20 %
Lymphs Abs: 1.3 10*3/uL (ref 1.0–3.6)
MCH: 30.2 pg (ref 26.0–34.0)
MCHC: 33.8 g/dL (ref 32.0–36.0)
MCV: 89.2 fL (ref 80.0–100.0)
MONOS PCT: 6 %
Monocytes Absolute: 0.4 10*3/uL (ref 0.2–0.9)
NEUTROS PCT: 72 %
Neutro Abs: 4.5 10*3/uL (ref 1.4–6.5)
Platelets: 146 10*3/uL — ABNORMAL LOW (ref 150–440)
RBC: 5 MIL/uL (ref 3.80–5.20)
RDW: 14.8 % — ABNORMAL HIGH (ref 11.5–14.5)
WBC: 6.2 10*3/uL (ref 3.6–11.0)

## 2017-09-12 NOTE — ED Notes (Signed)
Patient reports had surgery on knee on Feb 4th.  For the past couple of days reports having pain behind the knee.  Today started to feel like a cramp and heaviness.  Patient is able to bear weight on leg.

## 2017-09-12 NOTE — ED Triage Notes (Signed)
Pt reports that she had surgery on her right knee. She states that it hurts in the back of her knee. She states that clotting disorder. Pt reports that she takes Xerelto.

## 2017-09-12 NOTE — Discharge Instructions (Signed)
Fortunately today your blood work and your ultrasound were reassuring.  Please follow-up with your orthopedic surgeon within the next week for reevaluation.  Return to the emergency department sooner for any concerns whatsoever.  It was a pleasure to take care of you today, and thank you for coming to our emergency department.  If you have any questions or concerns before leaving please ask the nurse to grab me and I'm more than happy to go through your aftercare instructions again.  If you were prescribed any opioid pain medication today such as Norco, Vicodin, Percocet, morphine, hydrocodone, or oxycodone please make sure you do not drive when you are taking this medication as it can alter your ability to drive safely.  If you have any concerns once you are home that you are not improving or are in fact getting worse before you can make it to your follow-up appointment, please do not hesitate to call [phone] and come back for further evaluation.  Merrily Brittle, MD  Results for orders placed or performed during the hospital encounter of 09/12/17  CBC with Differential  Result Value Ref Range   WBC 6.2 3.6 - 11.0 K/uL   RBC 5.00 3.80 - 5.20 MIL/uL   Hemoglobin 15.1 12.0 - 16.0 g/dL   HCT 16.1 09.6 - 04.5 %   MCV 89.2 80.0 - 100.0 fL   MCH 30.2 26.0 - 34.0 pg   MCHC 33.8 32.0 - 36.0 g/dL   RDW 40.9 (H) 81.1 - 91.4 %   Platelets 146 (L) 150 - 440 K/uL   Neutrophils Relative % 72 %   Neutro Abs 4.5 1.4 - 6.5 K/uL   Lymphocytes Relative 20 %   Lymphs Abs 1.3 1.0 - 3.6 K/uL   Monocytes Relative 6 %   Monocytes Absolute 0.4 0.2 - 0.9 K/uL   Eosinophils Relative 1 %   Eosinophils Absolute 0.1 0 - 0.7 K/uL   Basophils Relative 1 %   Basophils Absolute 0.0 0 - 0.1 K/uL  Basic metabolic panel  Result Value Ref Range   Sodium 139 135 - 145 mmol/L   Potassium 3.9 3.5 - 5.1 mmol/L   Chloride 104 101 - 111 mmol/L   CO2 27 22 - 32 mmol/L   Glucose, Bld 106 (H) 65 - 99 mg/dL   BUN 15 6 - 20 mg/dL    Creatinine, Ser 7.82 0.44 - 1.00 mg/dL   Calcium 9.5 8.9 - 95.6 mg/dL   GFR calc non Af Amer >60 >60 mL/min   GFR calc Af Amer >60 >60 mL/min   Anion gap 8 5 - 15  Protime-INR  Result Value Ref Range   Prothrombin Time 22.5 (H) 11.4 - 15.2 seconds   INR 2.00    US Venous Img Lower Unilateral Right  Result Date: 09/12/2017 CLINICAL DATA:  Right lower extremity pain and swelling. Recently postop from right knee surgery. EXAM: RIGHT LOWER EXTREMITY VENOUS DOPPLER ULTRASOUND TECHNIQUE: Gray-scale sonography with graded compression, as well as color Doppler and duplex ultrasound were performed to evaluate the lower extremity deep venous systems from the level of the common femoral vein and including the common femoral, femoral, profunda femoral, popliteal and calf veins including the posterior tibial, peroneal and gastrocnemius veins when visible. The superficial great saphenous vein was also interrogated. Spectral Doppler was utilized to evaluate flow at rest and with distal augmentation maneuvers in the common femoral, femoral and popliteal veins. COMPARISON:  None. FINDINGS: Contralateral Common Femoral Vein: Respiratory phasicity is normal and  symmetric with the symptomatic side. No evidence of thrombus. Normal compressibility. Common Femoral Vein: No evidence of thrombus. Normal compressibility, respiratory phasicity and response to augmentation. Saphenofemoral Junction: No evidence of thrombus. Normal compressibility and flow on color Doppler imaging. Profunda Femoral Vein: No evidence of thrombus. Normal compressibility and flow on color Doppler imaging. Femoral Vein: No evidence of thrombus. Normal compressibility, respiratory phasicity and response to augmentation. Popliteal Vein: No evidence of thrombus. Normal compressibility, respiratory phasicity and response to augmentation. Calf Veins: No evidence of thrombus. Normal compressibility and flow on color Doppler imaging. Superficial Great  Saphenous Vein: No evidence of thrombus. Normal compressibility. Venous Reflux:  None. Other Findings: Small hypoechoic fluid collection in the right popliteal fossa measuring 4.3 x 1.0 x 1.2 cm. This could represent a Baker's cyst or postoperative fluid collection. IMPRESSION: No evidence of deep venous thrombosis. Small fluid collection in right popliteal fossa, which could represent a Baker's cyst or postoperative collection. Electronically Signed   By: Myles Rosenthal M.D.   On: 09/12/2017 18:28

## 2017-09-12 NOTE — ED Provider Notes (Signed)
[facility] Emergency Department Provider Note  ____________________________________________   First MD Initiated Contact with Patient 09/12/17 1947     (approximate)  I have reviewed the triage vital signs and the nursing notes.   HISTORY  Chief Complaint Knee Pain   HPI Tracey Chandler is a 52 y.o. female who self presents the emergency department for 3-4 days of gradual onset slowly progressive constant throbbing aching now moderate severity pain behind her right knee.  2 weeks ago she had laparoscopic surgery on meniscus on her right knee.  She initially recovered quite well but her pain is now worsened.  She denies fevers or chills.  Her pain is worse when ambulating improved with rest.  No numbness or weakness.  Past Medical History:  Diagnosis Date  . Anxiety   . Cervical dysplasia   . COPD (chronic obstructive pulmonary disease) (HCC)   . Discharge from the vagina 11/19/2014  . DVT (deep venous thrombosis) (HCC)   . Fibromyalgia   . GERD (gastroesophageal reflux disease)   . H/O: obesity 04/06/2014  . Hematuria   . Herpes, genital   . History of blood clotting disorder   . Hypertension    lost weight/no meds for 10 years  . Microscopic hematuria 11/23/2015  . Mixed incontinence   . MS (multiple sclerosis) (HCC)   . Multiple sclerosis (HCC) 06/03/2015  . OAB (overactive bladder)   . Obesity   . Pulmonary embolism (HCC)   . Snores   . Stroke [facility])     Patient Active Problem List   Diagnosis Date Noted  . Urge urinary incontinence 11/23/2015  . Microscopic hematuria 11/23/2015  . Abnormal cells of cervix 06/03/2015  . Fibromyalgia 06/03/2015  . Multiple sclerosis (HCC) 06/03/2015  . Discharge from the vagina 11/19/2014  . Pulmonary embolism (HCC) 04/06/2014  . Mild chronic obstructive pulmonary disease (HCC) 04/06/2014  . Snores 04/06/2014  . H/O: obesity 04/06/2014    Past Surgical History:  Procedure Laterality Date  . ABDOMINAL  HYSTERECTOMY    . Bladder mesh    . BLADDER SUSPENSION    . CHOLECYSTECTOMY    . Chonca batosa    . CHONDROPLASTY Right 08/23/2017   Procedure: CHONDROPLASTY;  Surgeon: Lyndle Herrlich, MD;  Location: [facility] ORS;  Service: Orthopedics;  Laterality: Right;  . DILATION AND CURETTAGE OF UTERUS    . KNEE ARTHROSCOPY WITH MEDIAL MENISECTOMY Right 08/23/2017   Procedure: KNEE ARTHROSCOPY WITH MEDIAL MENISECTOMY;  Surgeon: Lyndle Herrlich, MD;  Location: [facility] ORS;  Service: Orthopedics;  Laterality: Right;  . LASER ABLATION CONDYLOMA CERVICAL / VULVAR    . SYNOVECTOMY Right 08/23/2017   Procedure: SYNOVECTOMY;  Surgeon: Lyndle Herrlich, MD;  Location: [facility] ORS;  Service: Orthopedics;  Laterality: Right;    Prior to Admission medications   Medication Sig Start Date End Date Taking? Authorizing Provider  albuterol (PROAIR HFA) 108 (90 BASE) MCG/ACT inhaler Inhale 2 puffs into the lungs every 6 (six) hours as needed for wheezing or shortness of breath.  07/16/14 08/23/17  [provider]  busPIRone (BUSPAR) 5 MG tablet Take 1 tablet (5 mg total) by mouth 3 (three) times daily. Patient taking differently: Take 5 mg by mouth 3 (three) times daily as needed (for anxiety).  06/30/17   Carlean Jews, NP  Calcium Carbonate-Vitamin D (CALCIUM-VITAMIN D3 PO) Take 1 tablet by mouth daily.    [provider]  Cholecalciferol (VITAMIN D3) 2000 UNITS capsule Take 2,000 Units by mouth daily.  [provider]  docusate sodium (COLACE) 100 MG capsule Take 1 capsule (100 mg total) by mouth daily as needed. 08/23/17 08/23/18  Lyndle Herrlich, MD  Enoxaparin Sodium (LOVENOX [place]) Inject into the skin daily.    [provider]  fesoterodine (TOVIAZ) 8 MG TB24 tablet Take 1 tablet (8 mg total) by mouth daily. 07/21/16   Alfredo Martinez, MD  fluticasone (FLONASE) 50 MCG/ACT nasal spray Place 1 spray into both nostrils daily as needed for allergies.     [provider]    HYDROcodone-acetaminophen (NORCO/VICODIN) 5-325 MG tablet Take 1 tablet by mouth every 4 (four) hours as needed for moderate pain. 08/23/17 08/23/18  Lyndle Herrlich, MD  LYRICA 150 MG capsule Take 1 capsule (150 mg total) by mouth 2 (two) times daily. Patient taking differently: Take 150 mg by mouth every evening.  06/30/17   Carlean Jews, NP  meclizine (ANTIVERT) 25 MG tablet TAKE 25 MG BY MOUTH 3 TIMES A DAY AS NEEDED FOR DIZZINESS 10/18/15   [provider]  oxybutynin (DITROPAN XL) 15 MG 24 hr tablet Take 1 tablet (15 mg total) by mouth daily. 06/30/17   Carlean Jews, NP  pantoprazole (PROTONIX) 40 MG tablet Take 1 tablet (40 mg total) by mouth daily. 06/30/17   Carlean Jews, NP  sertraline (ZOLOFT) 100 MG tablet Take 1.5 tablets (150 mg total) by mouth daily. 09/13/17   Carlean Jews, NP  sulfamethoxazole-trimethoprim (BACTRIM DS,SEPTRA DS) 800-160 MG tablet Take 1 tablet by mouth every 12 (twelve) hours. 08/31/17   McGowan, Wellington Hampshire, PA-C  valACYclovir (VALTREX) 500 MG tablet Take 1 tablet (500 mg total) by mouth daily. Patient taking differently: Take 1,000 mg by mouth daily as needed (for out breaks).  06/30/17   Boscia, Kathlynn Grate, NP  XARELTO 20 MG TABS tablet Take 1 tablet (20 mg total) by mouth daily. 06/30/17   Carlean Jews, NP    Allergies Cefaclor; Penicillins; and Phenytoin  Family History  Problem Relation Age of Onset  . Hypertension Mother   . Hypertension Father   . Prostate cancer Paternal Uncle   . Kidney cancer Neg Hx   . Bladder Cancer Neg Hx     Social History Social History   Tobacco Use  . Smoking status: Former Smoker    Packs/day: 0.50    Types: Cigarettes    Last attempt to quit: 08/15/2017    Years since quitting: 0.0  . Smokeless tobacco: Never Used  Substance Use Topics  . Alcohol use: No    Alcohol/week: 0.0 oz  . Drug use: No    Review of Systems Constitutional: No fever/chills ENT: No sore  throat. Cardiovascular: Denies chest pain. Respiratory: Denies shortness of breath. Gastrointestinal: No abdominal pain.  No nausea, no vomiting.  No diarrhea.  No constipation. Musculoskeletal: Negative for back pain. Neurological: Negative for headaches   ____________________________________________   PHYSICAL EXAM:  VITAL SIGNS: ED Triage Vitals  Enc Vitals Group     BP 09/12/17 1656 120/73     Pulse Rate 09/12/17 1656 83     Resp 09/12/17 1656 17     Temp 09/12/17 1656 98.6 F (37 C)     Temp Source 09/12/17 1656 Oral     SpO2 09/12/17 1656 98 %     Weight 09/12/17 1657 223 lb (101.2 kg)     Height 09/12/17 1657 5\' 4"  (1.626 m)     Head Circumference --  Peak Flow --      Pain Score 09/12/17 1657 8     Pain Loc --      Pain Edu? --      Excl. in GC? --     Constitutional: Alert and oriented x4 somewhat uncomfortable appearing nontoxic no diaphoresis speaks full clear sentences Head: Atraumatic. Nose: No congestion/rhinnorhea. Mouth/Throat: No trismus Neck: No stridor.   Cardiovascular: Regular rate and rhythm Respiratory: Normal respiratory effort.  No retractions. MSK: Right knee grossly stable extensor mechanism intact quite tender popliteal fossa no erythema or warmth no discharge Neurologic:  Normal speech and language. No gross focal neurologic deficits are appreciated.  Skin:  Skin is warm, dry and intact. No rash noted.    ____________________________________________  LABS (all labs ordered are listed, but only abnormal results are displayed)  Labs Reviewed  CBC WITH DIFFERENTIAL/PLATELET - Abnormal; Notable for the following components:      Result Value   RDW 14.8 (*)    Platelets 146 (*)    All other components within normal limits  BASIC METABOLIC PANEL - Abnormal; Notable for the following components:   Glucose, Bld 106 (*)    All other components within normal limits  PROTIME-INR - Abnormal; Notable for the following components:    Prothrombin Time 22.5 (*)    All other components within normal limits    Lab work reviewed by me with no acute disease __________________________________________  EKG   ____________________________________________  RADIOLOGY  Ultrasound of the right lower extremity reviewed by me consistent with likely Baker's cyst versus postoperative fluid collection ____________________________________________   DIFFERENTIAL includes but not limited to  Septic joint, Baker's cyst, gout, deep vein thrombosis   PROCEDURES  Procedure(s) performed: no  Procedures  Critical Care performed: no  Observation: no ____________________________________________   INITIAL IMPRESSION / ASSESSMENT AND PLAN / ED COURSE  Pertinent labs & imaging results that were available during my care of the patient were reviewed by me and considered in my medical decision making (see chart for details).  By the time I saw the patient she had already had blood work which was reassuring and an ultrasound which is consistent with a postoperative fluid collection versus Baker's cyst.  Doubt abscess or septic joint at this time as she is able to freely range her knee and is afebrile.  I have encouraged the patient to follow-up with her orthopedic surgeon this coming week for reevaluation.  Her pain is adequately controlled.  Neurovascularly intact.  She is discharged home in improved condition verbalizes understanding and agreement with the plan.      ____________________________________________   FINAL CLINICAL IMPRESSION(S) / ED DIAGNOSES  Final diagnoses:  Synovial cyst of right popliteal space      NEW MEDICATIONS STARTED DURING THIS VISIT:  Discharge Medication List as of 09/12/2017  7:57 PM       Note:  This document was prepared using Dragon voice recognition software and may include unintentional dictation errors.      Merrily Brittle, MD 09/13/17 2219

## 2017-09-13 ENCOUNTER — Other Ambulatory Visit: Payer: Self-pay

## 2017-09-13 DIAGNOSIS — F339 Major depressive disorder, recurrent, unspecified: Secondary | ICD-10-CM

## 2017-09-13 MED ORDER — SERTRALINE HCL 100 MG PO TABS: 150.0000 mg | ORAL_TABLET | Freq: Every day | ORAL | 0 refills | Status: DC

## 2017-09-21 ENCOUNTER — Ambulatory Visit (INDEPENDENT_AMBULATORY_CARE_PROVIDER_SITE_OTHER): Payer: Medicare Other | Admitting: Urology

## 2017-09-21 ENCOUNTER — Encounter: Payer: Self-pay | Admitting: Urology

## 2017-09-21 VITALS — BP 122/80 | HR 80 | Ht 63.0 in | Wt 225.0 lb

## 2017-09-21 DIAGNOSIS — N3946 Mixed incontinence: Secondary | ICD-10-CM | POA: Diagnosis not present

## 2017-09-21 DIAGNOSIS — Z87448 Personal history of other diseases of urinary system: Secondary | ICD-10-CM

## 2017-09-21 LAB — URINALYSIS, COMPLETE
BILIRUBIN UA: NEGATIVE
Glucose, UA: NEGATIVE
Ketones, UA: NEGATIVE
Leukocytes, UA: NEGATIVE
Nitrite, UA: NEGATIVE
PH UA: 6 (ref 5.0–7.5)
Protein, UA: NEGATIVE
RBC UA: NEGATIVE
Specific Gravity, UA: 1.015 (ref 1.005–1.030)
UUROB: 0.2 mg/dL (ref 0.2–1.0)

## 2017-09-21 LAB — MICROSCOPIC EXAMINATION
RBC, UA: NONE SEEN /hpf (ref 0–?)
WBC UA: NONE SEEN /HPF (ref 0–?)

## 2017-09-21 MED ORDER — MIRABEGRON ER 25 MG PO TB24: 25.0000 mg | ORAL_TABLET | Freq: Every day | ORAL | 12 refills | Status: DC

## 2017-09-21 MED ORDER — MIRABEGRON ER 25 MG PO TB24: 25.0000 mg | ORAL_TABLET | Freq: Every day | ORAL | 3 refills | Status: DC

## 2017-09-21 NOTE — Progress Notes (Signed)
09/21/2017 9:55 PM   Tracey Chandler 05-13-1966 [id number]  Referring provider: Lyndon Code, MD [address] [place], [place] [address]  Chief Complaint  Patient presents with  . Urinary Incontinence  . Follow-up    HPI: Patient is a 52 year old Caucasian female with a history of multiple sclerosis, mixed incontinence and a history of hematuria who presents today for a 3-week follow-up after adding the Myrbetriq 25 mg daily to her oxybutynin 15 mg XL.  Mixed urinary incontinence Patient has failed Myrbetriq, Vesicare and PTNS therapy.  She is currently on a course of oxybutynin 15 mg XL and Myrbetriq 25 mg.  The patient is now experiencing urgency x 0-3 (stable), frequency x 4-7 (stable), is still not restricting fluids to avoid visits to the restroom, is still engaging in toilet mapping, incontinence x 4-7 (stable) and nocturia x 0-3 (stable).    Her BP is 122/80.  Her PVR is 24 mL.  Her main complaints are frequency, urgency, nocturia, incontinence and intermittency.  She has not seen a significant improvement with her urinary symptoms with the addition of the Myrbetriq.  She is still having a "seepage."    History of hematuria CTU and cystoscopy performed in 2015 were negative for malignancies.   CATH UA was negative for hematuria.    PMH: Past Medical History:  Diagnosis Date  . Anxiety   . Cervical dysplasia   . COPD (chronic obstructive pulmonary disease) (HCC)   . Discharge from the vagina 11/19/2014  . DVT (deep venous thrombosis) (HCC)   . Fibromyalgia   . GERD (gastroesophageal reflux disease)   . H/O: obesity 04/06/2014  . Hematuria   . Herpes, genital   . History of blood clotting disorder   . Hypertension    lost weight/no meds for 10 years  . Microscopic hematuria 11/23/2015  . Mixed incontinence   . MS (multiple sclerosis) (HCC)   . Multiple sclerosis (HCC) 06/03/2015  . OAB (overactive bladder)   . Obesity   . Pulmonary embolism (HCC)   . Snores   .  Stroke [facility])     Surgical History: Past Surgical History:  Procedure Laterality Date  . ABDOMINAL HYSTERECTOMY    . Bladder mesh    . BLADDER SUSPENSION    . CHOLECYSTECTOMY    . Chonca batosa    . CHONDROPLASTY Right 08/23/2017   Procedure: CHONDROPLASTY;  Surgeon: Lyndle Herrlich, MD;  Location: [facility] ORS;  Service: Orthopedics;  Laterality: Right;  . DILATION AND CURETTAGE OF UTERUS    . KNEE ARTHROSCOPY WITH MEDIAL MENISECTOMY Right 08/23/2017   Procedure: KNEE ARTHROSCOPY WITH MEDIAL MENISECTOMY;  Surgeon: Lyndle Herrlich, MD;  Location: [facility] ORS;  Service: Orthopedics;  Laterality: Right;  . LASER ABLATION CONDYLOMA CERVICAL / VULVAR    . SYNOVECTOMY Right 08/23/2017   Procedure: SYNOVECTOMY;  Surgeon: Lyndle Herrlich, MD;  Location: [facility] ORS;  Service: Orthopedics;  Laterality: Right;    Home Medications:  Allergies as of 09/21/2017      Reactions   Cefaclor Other (See Comments)    swells face and turns red   Penicillins Hives, Other (See Comments)   Has patient had a PCN reaction causing immediate rash, facial/tongue/throat swelling, SOB or lightheadedness with hypotension: Unknown Has patient had a PCN reaction causing severe rash involving mucus membranes or skin necrosis: No Has patient had a PCN reaction that required hospitalization: Unknown Has patient had a PCN reaction occurring within the last 10 years: No If all of  the above answers are "NO", then may proceed with Cephalosporin use.   Phenytoin Hives   Other reaction(s): Unknown      Medication List        Accurate as of 09/21/17 11:59 PM. Always use your most recent med list.          busPIRone 5 MG tablet Commonly known as:  BUSPAR Take 1 tablet (5 mg total) by mouth 3 (three) times daily.   CALCIUM-VITAMIN D3 PO Take 1 tablet by mouth daily.   docusate sodium 100 MG capsule Commonly known as:  COLACE Take 1 capsule (100 mg total) by mouth daily as needed.   fesoterodine 8 MG Tb24 tablet Commonly known  as:  TOVIAZ Take 1 tablet (8 mg total) by mouth daily.   fluticasone 50 MCG/ACT nasal spray Commonly known as:  FLONASE Place 1 spray into both nostrils daily as needed for allergies.   HYDROcodone-acetaminophen 5-325 MG tablet Commonly known as:  NORCO/VICODIN Take 1 tablet by mouth every 4 (four) hours as needed for moderate pain.   LOVENOX [place] Inject into the skin daily.   LYRICA 150 MG capsule Generic drug:  pregabalin Take 1 capsule (150 mg total) by mouth 2 (two) times daily.   meclizine 25 MG tablet Commonly known as:  ANTIVERT TAKE 25 MG BY MOUTH 3 TIMES A DAY AS NEEDED FOR DIZZINESS   mirabegron ER 25 MG Tb24 tablet Commonly known as:  MYRBETRIQ Take 1 tablet (25 mg total) by mouth daily.   oxybutynin 15 MG 24 hr tablet Commonly known as:  DITROPAN XL Take 1 tablet (15 mg total) by mouth daily.   pantoprazole 40 MG tablet Commonly known as:  PROTONIX Take 1 tablet (40 mg total) by mouth daily.   PROAIR HFA 108 (90 Base) MCG/ACT inhaler Generic drug:  albuterol Inhale 2 puffs into the lungs every 6 (six) hours as needed for wheezing or shortness of breath.   sertraline 100 MG tablet Commonly known as:  ZOLOFT Take 1.5 tablets (150 mg total) by mouth daily.   sulfamethoxazole-trimethoprim 800-160 MG tablet Commonly known as:  BACTRIM DS,SEPTRA DS Take 1 tablet by mouth every 12 (twelve) hours.   valACYclovir 500 MG tablet Commonly known as:  VALTREX Take 1 tablet (500 mg total) by mouth daily.   Vitamin D3 2000 units capsule Take 2,000 Units by mouth daily.   XARELTO 20 MG Tabs tablet Generic drug:  rivaroxaban Take 1 tablet (20 mg total) by mouth daily.       Allergies:  Allergies  Allergen Reactions  . Cefaclor Other (See Comments)     swells face and turns red  . Penicillins Hives and Other (See Comments)    Has patient had a PCN reaction causing immediate rash, facial/tongue/throat swelling, SOB or lightheadedness with hypotension:  Unknown Has patient had a PCN reaction causing severe rash involving mucus membranes or skin necrosis: No Has patient had a PCN reaction that required hospitalization: Unknown Has patient had a PCN reaction occurring within the last 10 years: No If all of the above answers are "NO", then may proceed with Cephalosporin use.   Marland Kitchen Phenytoin Hives    Other reaction(s): Unknown    Family History: Family History  Problem Relation Age of Onset  . Hypertension Mother   . Hypertension Father   . Prostate cancer Paternal Uncle   . Kidney cancer Neg Hx   . Bladder Cancer Neg Hx     Social History:  reports that she  quit smoking about 6 weeks ago. Her smoking use included cigarettes. She smoked 0.50 packs per day. she has never used smokeless tobacco. She reports that she does not drink alcohol or use drugs.  ROS: UROLOGY Frequent Urination?: Yes Hard to postpone urination?: Yes Burning/pain with urination?: No Get up at night to urinate?: Yes Leakage of urine?: Yes Urine stream starts and stops?: Yes Trouble starting stream?: No Do you have to strain to urinate?: No Blood in urine?: No Urinary tract infection?: No Sexually transmitted disease?: No Injury to kidneys or bladder?: No Painful intercourse?: No Weak stream?: No Currently pregnant?: No Vaginal bleeding?: No Last menstrual period?: n  Gastrointestinal Nausea?: No Vomiting?: No Indigestion/heartburn?: No Diarrhea?: No Constipation?: No  Constitutional Fever: No Night sweats?: No Weight loss?: No Fatigue?: No  Skin Skin rash/lesions?: No Itching?: No  Eyes Blurred vision?: No Double vision?: No  Ears/Nose/Throat Sore throat?: No Sinus problems?: No  Hematologic/Lymphatic Swollen glands?: No Easy bruising?: No  Cardiovascular Leg swelling?: No Chest pain?: No  Respiratory Cough?: No Shortness of breath?: No  Endocrine Excessive thirst?: No  Musculoskeletal Back pain?: No Joint pain?:  No  Neurological Headaches?: No Dizziness?: No  Psychologic Depression?: Yes Anxiety?: Yes  Physical Exam: BP 122/80   Pulse 80   Ht 5\' 3"  (1.6 m)   Wt 225 lb (102.1 kg)   BMI 39.86 kg/m   Constitutional: Well nourished. Alert and oriented, No acute distress. HEENT: [place] AT, moist mucus membranes. Trachea midline, no masses. Cardiovascular: No clubbing, cyanosis, or edema. Respiratory: Normal respiratory effort, no increased work of breathing. Skin: No rashes, bruises or suspicious lesions. Lymph: No cervical or inguinal adenopathy. Neurologic: Grossly intact, no focal deficits, moving all 4 extremities. Psychiatric: Normal mood and affect.  Laboratory Data: Lab Results  Component Value Date   WBC 6.2 09/12/2017   HGB 15.1 09/12/2017   HCT 44.6 09/12/2017   MCV 89.2 09/12/2017   PLT 146 (L) 09/12/2017    Lab Results  Component Value Date   CREATININE 0.75 09/12/2017    No results found for: PSA  No results found for: TESTOSTERONE  No results found for: HGBA1C  No results found for: TSH  No results found for: CHOL, HDL, CHOLHDL, VLDL, LDLCALC  Lab Results  Component Value Date   AST 21 03/30/2014   Lab Results  Component Value Date   ALT 26 03/30/2014   No components found for: ALKALINEPHOPHATASE No components found for: BILIRUBINTOTAL  No results found for: ESTRADIOL  Urinalysis CATH UA negative.  See Epic.    I have reviewed the labs.   Procedure In and Out Catheterization Patient is present today for a I & O catheterization due to recurrent UTI's. Patient was cleaned and prepped in a sterile fashion with betadine and Lidocaine 2% jelly was instilled into the urethra.  A 14 FR cath was inserted no complications were noted , 24 ml of urine return was noted, urine was yellow clear in color. A clean urine sample was collected for UA. Bladder was drained  And catheter was removed with out difficulty.    Preformed by: Rupert Stacks,  LPN   Assessment & Plan:    1. Mixed incontinence  -continue oxybutynin and add Myrbetriq 25 mg daily  -patient will wear a tampons for one week to see if the "seepage" stops or continues hopefully clueing [place] in on whether the seepage is vaginal or urine  2. History of hematuria  - CATH UA is negative for  hematuria   Return in about 1 week (around 09/28/2017) for follow up .  These notes generated with voice recognition software. I apologize for typographical errors.  Michiel Cowboy, PA-C  [facility] [address], [address] [place], [place] [address] [phone]

## 2017-09-28 NOTE — Progress Notes (Deleted)
09/29/2017 9:57 PM   Tracey Chandler 09-15-1965 [id number]  Referring provider: Lyndon Code, MD [address] [place], [place] [address]  No chief complaint on file.   HPI: Patient is a 52 year old Caucasian female with a history of multiple sclerosis, mixed incontinence and a history of hematuria who presents today for a one-week follow-up after wearing tampons for 1 week to see if we can elucidate whether or not the seepage she is experiencing is vaginal or her urine.    Mixed urinary incontinence Patient has failed Myrbetriq, Vesicare and PTNS therapy.  She is currently on a course of oxybutynin 15 mg XL and Myrbetriq 25 mg.  The patient is now experiencing urgency x 0-3 (stable), frequency x 4-7 (stable), is still not restricting fluids to avoid visits to the restroom, is still engaging in toilet mapping, incontinence x 4-7 (stable) and nocturia x 0-3 (stable).    Her BP is 122/80.  Her PVR is 24 mL.  Her main complaints are frequency, urgency, nocturia, incontinence and intermittency.  She has not seen a significant improvement with her urinary symptoms with the addition of the Myrbetriq.  She is still having a "seepage."    History of hematuria CTU and cystoscopy performed in 2015 were negative for malignancies.   CATH UA was negative for hematuria.    PMH: Past Medical History:  Diagnosis Date  . Anxiety   . Cervical dysplasia   . COPD (chronic obstructive pulmonary disease) (HCC)   . Discharge from the vagina 11/19/2014  . DVT (deep venous thrombosis) (HCC)   . Fibromyalgia   . GERD (gastroesophageal reflux disease)   . H/O: obesity 04/06/2014  . Hematuria   . Herpes, genital   . History of blood clotting disorder   . Hypertension    lost weight/no meds for 10 years  . Microscopic hematuria 11/23/2015  . Mixed incontinence   . MS (multiple sclerosis) (HCC)   . Multiple sclerosis (HCC) 06/03/2015  . OAB (overactive bladder)   . Obesity   . Pulmonary embolism (HCC)     . Snores   . Stroke [facility])     Surgical History: Past Surgical History:  Procedure Laterality Date  . ABDOMINAL HYSTERECTOMY    . Bladder mesh    . BLADDER SUSPENSION    . CHOLECYSTECTOMY    . Chonca batosa    . CHONDROPLASTY Right 08/23/2017   Procedure: CHONDROPLASTY;  Surgeon: Lyndle Herrlich, MD;  Location: [facility] ORS;  Service: Orthopedics;  Laterality: Right;  . DILATION AND CURETTAGE OF UTERUS    . KNEE ARTHROSCOPY WITH MEDIAL MENISECTOMY Right 08/23/2017   Procedure: KNEE ARTHROSCOPY WITH MEDIAL MENISECTOMY;  Surgeon: Lyndle Herrlich, MD;  Location: [facility] ORS;  Service: Orthopedics;  Laterality: Right;  . LASER ABLATION CONDYLOMA CERVICAL / VULVAR    . SYNOVECTOMY Right 08/23/2017   Procedure: SYNOVECTOMY;  Surgeon: Lyndle Herrlich, MD;  Location: [facility] ORS;  Service: Orthopedics;  Laterality: Right;    Home Medications:  Allergies as of 09/29/2017      Reactions   Cefaclor Other (See Comments)    swells face and turns red   Penicillins Hives, Other (See Comments)   Has patient had a PCN reaction causing immediate rash, facial/tongue/throat swelling, SOB or lightheadedness with hypotension: Unknown Has patient had a PCN reaction causing severe rash involving mucus membranes or skin necrosis: No Has patient had a PCN reaction that required hospitalization: Unknown Has patient had a PCN reaction occurring within the last  10 years: No If all of the above answers are "NO", then may proceed with Cephalosporin use.   Phenytoin Hives   Other reaction(s): Unknown      Medication List        Accurate as of 09/28/17  9:57 PM. Always use your most recent med list.          busPIRone 5 MG tablet Commonly known as:  BUSPAR Take 1 tablet (5 mg total) by mouth 3 (three) times daily.   CALCIUM-VITAMIN D3 PO Take 1 tablet by mouth daily.   docusate sodium 100 MG capsule Commonly known as:  COLACE Take 1 capsule (100 mg total) by mouth daily as needed.   fesoterodine 8 MG Tb24  tablet Commonly known as:  TOVIAZ Take 1 tablet (8 mg total) by mouth daily.   fluticasone 50 MCG/ACT nasal spray Commonly known as:  FLONASE Place 1 spray into both nostrils daily as needed for allergies.   HYDROcodone-acetaminophen 5-325 MG tablet Commonly known as:  NORCO/VICODIN Take 1 tablet by mouth every 4 (four) hours as needed for moderate pain.   LOVENOX [place] Inject into the skin daily.   LYRICA 150 MG capsule Generic drug:  pregabalin Take 1 capsule (150 mg total) by mouth 2 (two) times daily.   meclizine 25 MG tablet Commonly known as:  ANTIVERT TAKE 25 MG BY MOUTH 3 TIMES A DAY AS NEEDED FOR DIZZINESS   mirabegron ER 25 MG Tb24 tablet Commonly known as:  MYRBETRIQ Take 1 tablet (25 mg total) by mouth daily.   oxybutynin 15 MG 24 hr tablet Commonly known as:  DITROPAN XL Take 1 tablet (15 mg total) by mouth daily.   pantoprazole 40 MG tablet Commonly known as:  PROTONIX Take 1 tablet (40 mg total) by mouth daily.   PROAIR HFA 108 (90 Base) MCG/ACT inhaler Generic drug:  albuterol Inhale 2 puffs into the lungs every 6 (six) hours as needed for wheezing or shortness of breath.   sertraline 100 MG tablet Commonly known as:  ZOLOFT Take 1.5 tablets (150 mg total) by mouth daily.   sulfamethoxazole-trimethoprim 800-160 MG tablet Commonly known as:  BACTRIM DS,SEPTRA DS Take 1 tablet by mouth every 12 (twelve) hours.   valACYclovir 500 MG tablet Commonly known as:  VALTREX Take 1 tablet (500 mg total) by mouth daily.   Vitamin D3 2000 units capsule Take 2,000 Units by mouth daily.   XARELTO 20 MG Tabs tablet Generic drug:  rivaroxaban Take 1 tablet (20 mg total) by mouth daily.       Allergies:  Allergies  Allergen Reactions  . Cefaclor Other (See Comments)     swells face and turns red  . Penicillins Hives and Other (See Comments)    Has patient had a PCN reaction causing immediate rash, facial/tongue/throat swelling, SOB or lightheadedness with  hypotension: Unknown Has patient had a PCN reaction causing severe rash involving mucus membranes or skin necrosis: No Has patient had a PCN reaction that required hospitalization: Unknown Has patient had a PCN reaction occurring within the last 10 years: No If all of the above answers are "NO", then may proceed with Cephalosporin use.   Marland Kitchen Phenytoin Hives    Other reaction(s): Unknown    Family History: Family History  Problem Relation Age of Onset  . Hypertension Mother   . Hypertension Father   . Prostate cancer Paternal Uncle   . Kidney cancer Neg Hx   . Bladder Cancer Neg Hx  Social History:  reports that she quit smoking about 6 weeks ago. Her smoking use included cigarettes. She smoked 0.50 packs per day. she has never used smokeless tobacco. She reports that she does not drink alcohol or use drugs.  ROS:                                        Physical Exam: There were no vitals taken for this visit.  Constitutional: Well nourished. Alert and oriented, No acute distress. HEENT: [place] AT, moist mucus membranes. Trachea midline, no masses. Cardiovascular: No clubbing, cyanosis, or edema. Respiratory: Normal respiratory effort, no increased work of breathing. Skin: No rashes, bruises or suspicious lesions. Lymph: No cervical or inguinal adenopathy. Neurologic: Grossly intact, no focal deficits, moving all 4 extremities. Psychiatric: Normal mood and affect.  Laboratory Data: Lab Results  Component Value Date   WBC 6.2 09/12/2017   HGB 15.1 09/12/2017   HCT 44.6 09/12/2017   MCV 89.2 09/12/2017   PLT 146 (L) 09/12/2017    Lab Results  Component Value Date   CREATININE 0.75 09/12/2017    No results found for: PSA  No results found for: TESTOSTERONE  No results found for: HGBA1C  No results found for: TSH  No results found for: CHOL, HDL, CHOLHDL, VLDL, LDLCALC  Lab Results  Component Value Date   AST 21 03/30/2014   Lab  Results  Component Value Date   ALT 26 03/30/2014   No components found for: ALKALINEPHOPHATASE No components found for: BILIRUBINTOTAL  No results found for: ESTRADIOL  Urinalysis CATH UA negative.  See Epic.    I have reviewed the labs.   Procedure In and Out Catheterization Patient is present today for a I & O catheterization due to recurrent UTI's. Patient was cleaned and prepped in a sterile fashion with betadine and Lidocaine 2% jelly was instilled into the urethra.  A 14 FR cath was inserted no complications were noted , 24 ml of urine return was noted, urine was yellow clear in color. A clean urine sample was collected for UA. Bladder was drained  And catheter was removed with out difficulty.    Preformed by: Rupert Stacks, LPN   Assessment & Plan:    1. Mixed incontinence  -continue oxybutynin and add Myrbetriq 25 mg daily  -patient will wear a tampons for one week to see if the "seepage" stops or continues hopefully clueing [place] in on whether the seepage is vaginal or urine  2. History of hematuria  - CATH UA is negative for hematuria   No Follow-up on file.  These notes generated with voice recognition software. I apologize for typographical errors.  Michiel Cowboy, PA-C  [facility] [address], [address] [place], [place] [address] [phone]

## 2017-09-29 ENCOUNTER — Ambulatory Visit: Payer: Medicare Other | Admitting: Urology

## 2017-09-29 ENCOUNTER — Telehealth: Payer: Self-pay | Admitting: Urology

## 2017-09-29 NOTE — Telephone Encounter (Signed)
Pt canceled today's appt due to being out of town with husband, attempted to get pt to reschedule appt 2-4 weeks, pt kept having excuses, Pt wants to let you know that wearing the tampon stopped the seepage in urine, pt sees trace amounts of blood on tampon, it is not bright blood. Pt would like to know how you want to proceed.  Please advise.  Thanks.

## 2017-09-29 NOTE — Telephone Encounter (Signed)
She needs to see her gynecologist for this.

## 2017-10-01 NOTE — Telephone Encounter (Signed)
Spoke with pt in reference to seek GYN. Pt voiced understanding.

## 2017-10-11 ENCOUNTER — Other Ambulatory Visit: Payer: Self-pay

## 2017-10-11 DIAGNOSIS — F411 Generalized anxiety disorder: Principal | ICD-10-CM

## 2017-10-11 DIAGNOSIS — F410 Panic disorder [episodic paroxysmal anxiety] without agoraphobia: Secondary | ICD-10-CM

## 2017-10-11 MED ORDER — BUSPIRONE HCL 5 MG PO TABS: 5.0000 mg | ORAL_TABLET | Freq: Three times a day (TID) | ORAL | 1 refills | Status: DC

## 2017-10-20 DIAGNOSIS — N952 Postmenopausal atrophic vaginitis: Secondary | ICD-10-CM | POA: Diagnosis not present

## 2017-10-20 DIAGNOSIS — Z124 Encounter for screening for malignant neoplasm of cervix: Secondary | ICD-10-CM | POA: Diagnosis not present

## 2017-11-03 DIAGNOSIS — J449 Chronic obstructive pulmonary disease, unspecified: Secondary | ICD-10-CM | POA: Diagnosis not present

## 2017-11-29 DIAGNOSIS — L821 Other seborrheic keratosis: Secondary | ICD-10-CM | POA: Diagnosis not present

## 2017-11-29 DIAGNOSIS — L639 Alopecia areata, unspecified: Secondary | ICD-10-CM | POA: Diagnosis not present

## 2017-12-02 ENCOUNTER — Encounter: Payer: Self-pay | Admitting: Internal Medicine

## 2017-12-02 ENCOUNTER — Other Ambulatory Visit: Payer: Self-pay | Admitting: Urology

## 2017-12-02 ENCOUNTER — Ambulatory Visit (INDEPENDENT_AMBULATORY_CARE_PROVIDER_SITE_OTHER): Payer: Medicare Other | Admitting: Internal Medicine

## 2017-12-02 VITALS — BP 112/76 | HR 79 | Temp 98.4°F | Resp 16 | Ht 63.0 in | Wt 228.0 lb

## 2017-12-02 DIAGNOSIS — G35 Multiple sclerosis: Secondary | ICD-10-CM | POA: Diagnosis not present

## 2017-12-02 DIAGNOSIS — M797 Fibromyalgia: Secondary | ICD-10-CM | POA: Diagnosis not present

## 2017-12-02 MED ORDER — HYDROCODONE-ACETAMINOPHEN 5-325 MG PO TABS: ORAL_TABLET | ORAL | 0 refills | Status: DC

## 2017-12-02 NOTE — Progress Notes (Signed)
[facility] [address] [place], [place] [address]  Internal MEDICINE  Office Visit Note  Patient Name: Tracey Chandler  [id number]  [id number]  Date of Service: 12/06/2017  Chief Complaint  Patient presents with  . Fibromyalgia  . Follow-up    medication refills    HPI  Pt is here for routine follow up. Pt has fibromyalgia and multiple sclerosis. She has no flare ups. She takes pain pills only occasionally.    Current Medication: Outpatient Encounter Medications as of 12/02/2017  Medication Sig  . augmented betamethasone dipropionate (DIPROLENE-AF) 0.05 % ointment Apply topically 2 (two) times daily.  . busPIRone (BUSPAR) 5 MG tablet Take 1 tablet (5 mg total) by mouth 3 (three) times daily.  . Calcium Carbonate-Vitamin D (CALCIUM-VITAMIN D3 PO) Take 1 tablet by mouth daily.  . Cholecalciferol (VITAMIN D3) 2000 UNITS capsule Take 2,000 Units by mouth daily.   . clobetasol (TEMOVATE) 0.05 % external solution Apply 1 application topically 2 (two) times daily.  . fluticasone (FLONASE) 50 MCG/ACT nasal spray Place 1 spray into both nostrils daily as needed for allergies.   Marland Kitchen LYRICA 150 MG capsule Take 1 capsule (150 mg total) by mouth 2 (two) times daily. (Patient taking differently: Take 150 mg by mouth every evening. )  . meclizine (ANTIVERT) 25 MG tablet TAKE 25 MG BY MOUTH 3 TIMES A DAY AS NEEDED FOR DIZZINESS  . mirabegron ER (MYRBETRIQ) 25 MG TB24 tablet Take 1 tablet (25 mg total) by mouth daily.  . pantoprazole (PROTONIX) 40 MG tablet Take 1 tablet (40 mg total) by mouth daily.  . valACYclovir (VALTREX) 500 MG tablet Take 1 tablet (500 mg total) by mouth daily. (Patient taking differently: Take 1,000 mg by mouth daily as needed (for out breaks). )  . XARELTO 20 MG TABS tablet Take 1 tablet (20 mg total) by mouth daily.  . [DISCONTINUED] HYDROcodone-acetaminophen (NORCO/VICODIN) 5-325 MG tablet Take 1 tablet by mouth every 4 (four) hours as needed for moderate pain.   . [DISCONTINUED] HYDROcodone-acetaminophen (NORCO/VICODIN) 5-325 MG tablet One tab po qd prn  . [DISCONTINUED] oxybutynin (DITROPAN XL) 15 MG 24 hr tablet Take 1 tablet (15 mg total) by mouth daily.  Marland Kitchen albuterol (PROAIR HFA) 108 (90 BASE) MCG/ACT inhaler Inhale 2 puffs into the lungs every 6 (six) hours as needed for wheezing or shortness of breath.   . sertraline (ZOLOFT) 100 MG tablet Take 1.5 tablets (150 mg total) by mouth daily.  . [DISCONTINUED] docusate sodium (COLACE) 100 MG capsule Take 1 capsule (100 mg total) by mouth daily as needed. (Patient not taking: Reported on 09/21/2017)  . [DISCONTINUED] Enoxaparin Sodium (LOVENOX [place]) Inject into the skin daily.  . [DISCONTINUED] fesoterodine (TOVIAZ) 8 MG TB24 tablet Take 1 tablet (8 mg total) by mouth daily. (Patient not taking: Reported on 09/21/2017)  . [DISCONTINUED] sulfamethoxazole-trimethoprim (BACTRIM DS,SEPTRA DS) 800-160 MG tablet Take 1 tablet by mouth every 12 (twelve) hours. (Patient not taking: Reported on 09/21/2017)   No facility-administered encounter medications on file as of 12/02/2017.     Surgical History: Past Surgical History:  Procedure Laterality Date  . ABDOMINAL HYSTERECTOMY    . Bladder mesh    . BLADDER SUSPENSION    . CHOLECYSTECTOMY    . Chonca batosa    . CHONDROPLASTY Right 08/23/2017   Procedure: CHONDROPLASTY;  Surgeon: Lyndle Herrlich, MD;  Location: [facility] ORS;  Service: Orthopedics;  Laterality: Right;  . DILATION AND CURETTAGE OF UTERUS    . KNEE ARTHROSCOPY  WITH MEDIAL MENISECTOMY Right 08/23/2017   Procedure: KNEE ARTHROSCOPY WITH MEDIAL MENISECTOMY;  Surgeon: Lyndle Herrlich, MD;  Location: [facility] ORS;  Service: Orthopedics;  Laterality: Right;  . LASER ABLATION CONDYLOMA CERVICAL / VULVAR    . SYNOVECTOMY Right 08/23/2017   Procedure: SYNOVECTOMY;  Surgeon: Lyndle Herrlich, MD;  Location: [facility] ORS;  Service: Orthopedics;  Laterality: Right;    Medical History: Past Medical History:  Diagnosis Date  .  Anxiety   . Cervical dysplasia   . COPD (chronic obstructive pulmonary disease) (HCC)   . Discharge from the vagina 11/19/2014  . DVT (deep venous thrombosis) (HCC)   . Fibromyalgia   . GERD (gastroesophageal reflux disease)   . H/O: obesity 04/06/2014  . Hematuria   . Herpes, genital   . History of blood clotting disorder   . Hypertension    lost weight/no meds for 10 years  . Microscopic hematuria 11/23/2015  . Mixed incontinence   . MS (multiple sclerosis) (HCC)   . Multiple sclerosis (HCC) 06/03/2015  . OAB (overactive bladder)   . Obesity   . Pulmonary embolism (HCC)   . Snores   . Stroke [facility])     Family History: Family History  Problem Relation Age of Onset  . Hypertension Mother   . Hypertension Father   . Prostate cancer Paternal Uncle   . Kidney cancer Neg Hx   . Bladder Cancer Neg Hx     Social History   Socioeconomic History  . Marital status: Married    Spouse name: Not on file  . Number of children: Not on file  . Years of education: Not on file  . Highest education level: Not on file  Occupational History  . Not on file  Social Needs  . Financial resource strain: Not on file  . Food insecurity:    Worry: Not on file    Inability: Not on file  . Transportation needs:    Medical: Not on file    Non-medical: Not on file  Tobacco Use  . Smoking status: Current Some Day Smoker    Packs/day: 3.00    Types: Cigarettes    Last attempt to quit: 08/15/2017    Years since quitting: 0.3  . Smokeless tobacco: Never Used  Substance and Sexual Activity  . Alcohol use: No    Alcohol/week: 0.0 oz  . Drug use: No  . Sexual activity: Not on file  Lifestyle  . Physical activity:    Days per week: Not on file    Minutes per session: Not on file  . Stress: Not on file  Relationships  . Social connections:    Talks on phone: Not on file    Gets together: Not on file    Attends religious service: Not on file    Active member of club or organization: Not on  file    Attends meetings of clubs or organizations: Not on file    Relationship status: Not on file  . Intimate partner violence:    Fear of current or ex partner: Not on file    Emotionally abused: Not on file    Physically abused: Not on file    Forced sexual activity: Not on file  Other Topics Concern  . Not on file  Social History Narrative  . Not on file    Review of Systems  Constitutional: Negative for chills, diaphoresis and fatigue.  HENT: Negative for ear pain, postnasal drip and sinus pressure.   Eyes:  Negative for photophobia, discharge, redness, itching and visual disturbance.  Respiratory: Negative for cough, shortness of breath and wheezing.   Cardiovascular: Negative for chest pain, palpitations and leg swelling.  Gastrointestinal: Negative for abdominal pain, constipation, diarrhea, nausea and vomiting.  Genitourinary: Negative for dysuria and flank pain.  Musculoskeletal: Negative for arthralgias, back pain, gait problem and neck pain.  Skin: Negative for color change.  Allergic/Immunologic: Negative for environmental allergies and food allergies.  Neurological: Negative for dizziness and headaches.  Hematological: Does not bruise/bleed easily.  Psychiatric/Behavioral: Negative for agitation, behavioral problems (depression) and hallucinations.   Vital Signs: BP 112/76   Pulse 79   Temp 98.4 F (36.9 C) (Oral)   Resp 16   Ht 5\' 3"  (1.6 m)   Wt 228 lb (103.4 kg)   SpO2 91%   BMI 40.39 kg/m   Physical Exam  Constitutional: She is oriented to person, place, and time. She appears well-developed and well-nourished. No distress.  HENT:  Head: Normocephalic and atraumatic.  Mouth/Throat: Oropharynx is clear and moist. No oropharyngeal exudate.  Eyes: EOM are normal.  Neck: Neck supple. No JVD present. No tracheal deviation present. No thyromegaly present.  Cardiovascular: Normal rate, regular rhythm and normal heart sounds. Exam reveals no gallop and no  friction rub.  No murmur heard. Pulmonary/Chest: Effort normal. No respiratory distress. She has no wheezes. She has no rales. She exhibits no tenderness.  Abdominal: Soft. Bowel sounds are normal.  Lymphadenopathy:    She has no cervical adenopathy.  Neurological: She is alert and oriented to person, place, and time. No cranial nerve deficit.  Skin: Skin is warm and dry. She is not diaphoretic.  Psychiatric: Her behavior is normal. Judgment and thought content normal.   Assessment/Plan: 1. Fibromyalgia - Ambulatory referral to Pain Clinic. Will transfer care.   2. Multiple sclerosis (HCC) - Ambulatory referral to Neurology// Not clear about this dx, Will refer to neurology   General Counseling: jacklyne baik understanding of the findings of todays visit and agrees with plan of treatment. I have discussed any further diagnostic evaluation that may be needed or ordered today. We also reviewed her medications today. she has been encouraged to call the office with any questions or concerns that should arise related to todays visit.   Orders Placed This Encounter  Procedures  . Ambulatory referral to Pain Clinic  . Ambulatory referral to Neurology    Meds ordered this encounter  Medications  . DISCONTD: HYDROcodone-acetaminophen (NORCO/VICODIN) 5-325 MG tablet    Sig: One tab po qd prn    Dispense:  20 tablet    Refill:  0    Time spent:15 Minutes   Dr Lyndon Code Internal medicine

## 2017-12-06 ENCOUNTER — Other Ambulatory Visit: Payer: Self-pay | Admitting: Internal Medicine

## 2017-12-06 DIAGNOSIS — N3941 Urge incontinence: Secondary | ICD-10-CM

## 2017-12-06 MED ORDER — OXYBUTYNIN CHLORIDE ER 15 MG PO TB24: 15.0000 mg | ORAL_TABLET | Freq: Every day | ORAL | 4 refills | Status: DC

## 2017-12-06 MED ORDER — HYDROCODONE-ACETAMINOPHEN 5-325 MG PO TABS: ORAL_TABLET | ORAL | 0 refills | Status: DC

## 2017-12-06 NOTE — Telephone Encounter (Signed)
Both are sent

## 2017-12-20 DIAGNOSIS — L638 Other alopecia areata: Secondary | ICD-10-CM | POA: Diagnosis not present

## 2018-01-04 ENCOUNTER — Other Ambulatory Visit: Payer: Self-pay

## 2018-01-04 DIAGNOSIS — F411 Generalized anxiety disorder: Principal | ICD-10-CM

## 2018-01-04 DIAGNOSIS — F410 Panic disorder [episodic paroxysmal anxiety] without agoraphobia: Secondary | ICD-10-CM

## 2018-01-04 MED ORDER — BUSPIRONE HCL 5 MG PO TABS: 5.0000 mg | ORAL_TABLET | Freq: Three times a day (TID) | ORAL | 0 refills | Status: DC | PRN

## 2018-01-04 MED ORDER — MECLIZINE HCL 25 MG PO TABS: ORAL_TABLET | ORAL | 0 refills | Status: DC

## 2018-01-17 DIAGNOSIS — L639 Alopecia areata, unspecified: Secondary | ICD-10-CM | POA: Diagnosis not present

## 2018-01-29 IMAGING — CR DG KNEE COMPLETE 4+V*L*
1 series · 4 of 4 positions shown · non-contrast
Comparison: 09/30/2013 radiographs

CLINICAL DATA: 49-year-old female with chronic left knee pain.

EXAM:
LEFT KNEE - COMPLETE 4+ VIEW

[Series 1: dg knee complete 4 views left · 0.14mm/px · 4 of 4 slices shown]
[im 1/4]
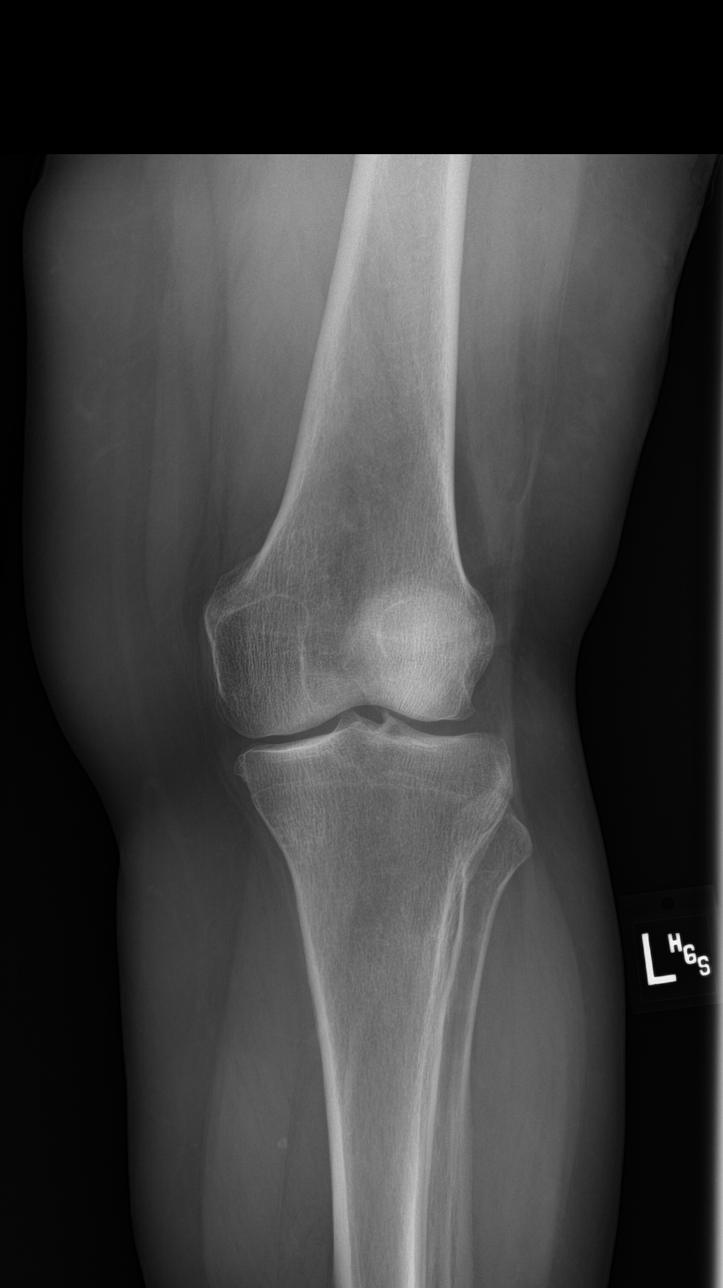
[im 2/4]
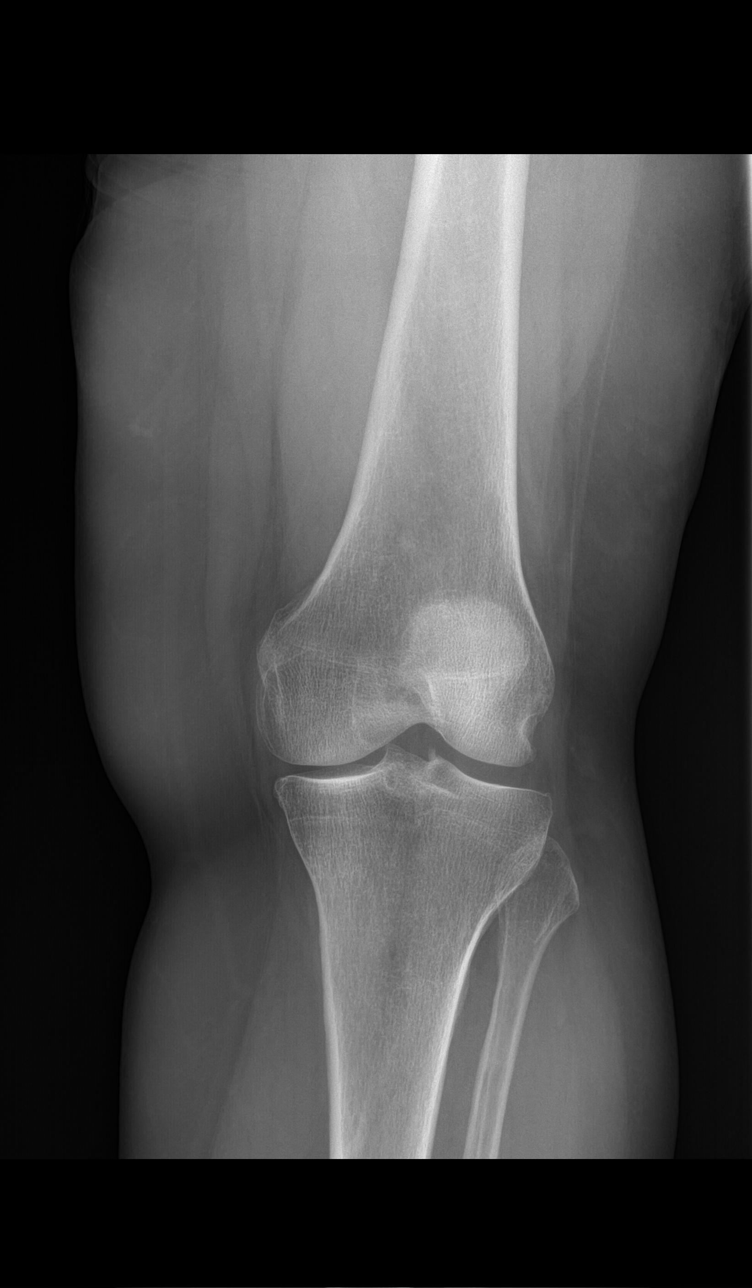
[im 3/4]
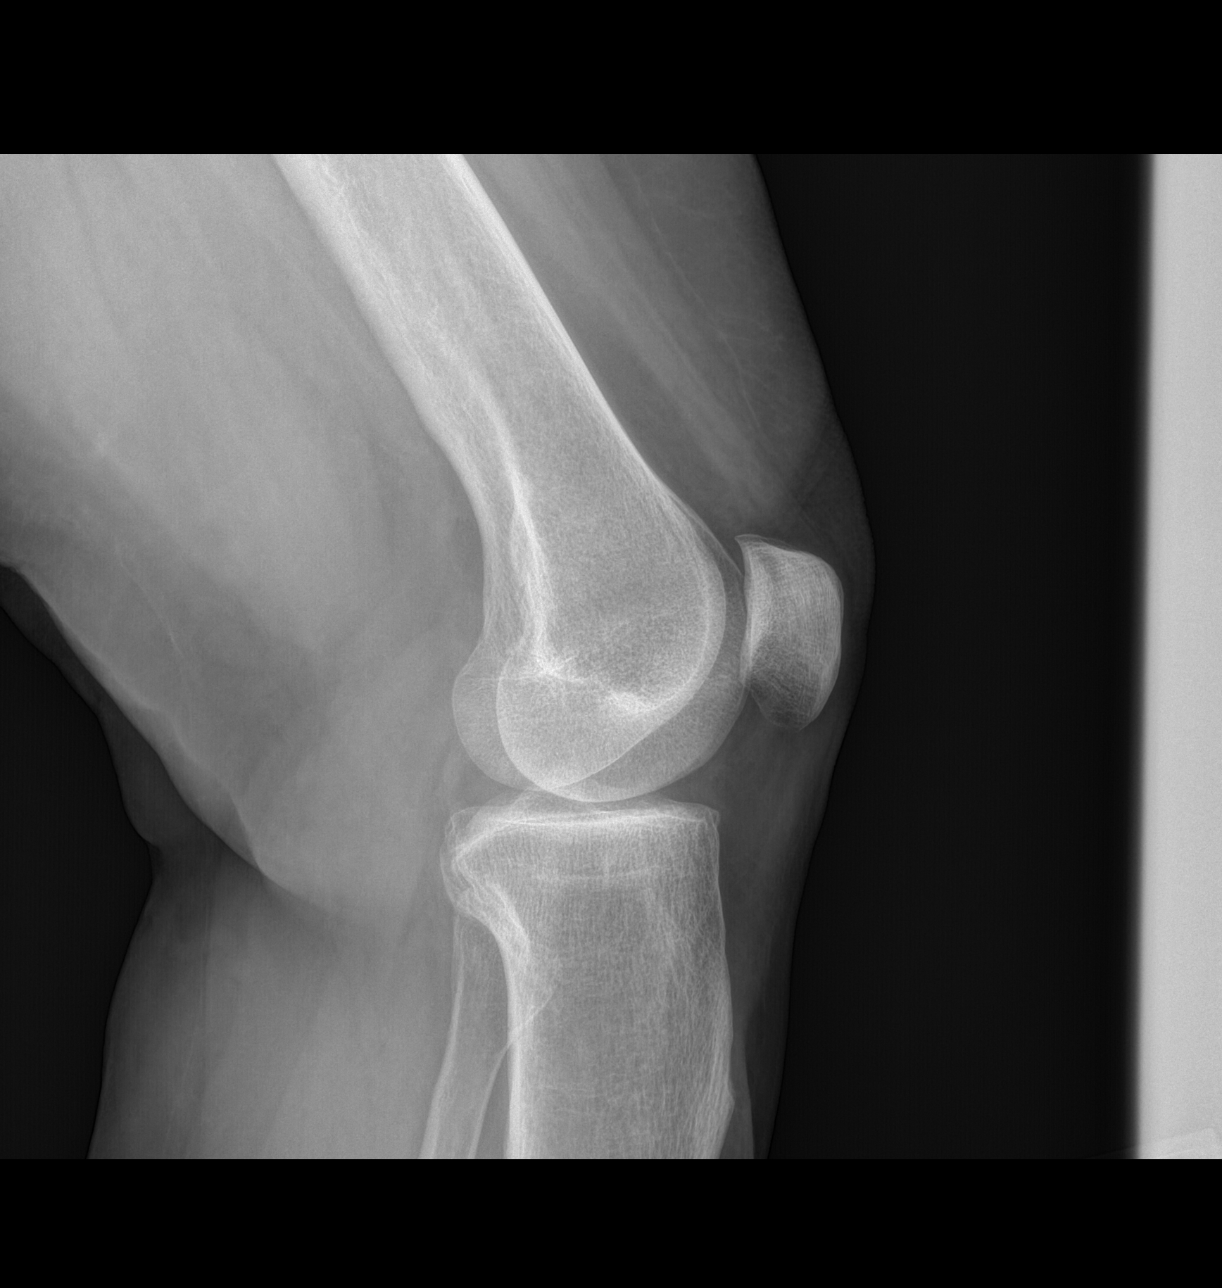
[im 4/4]
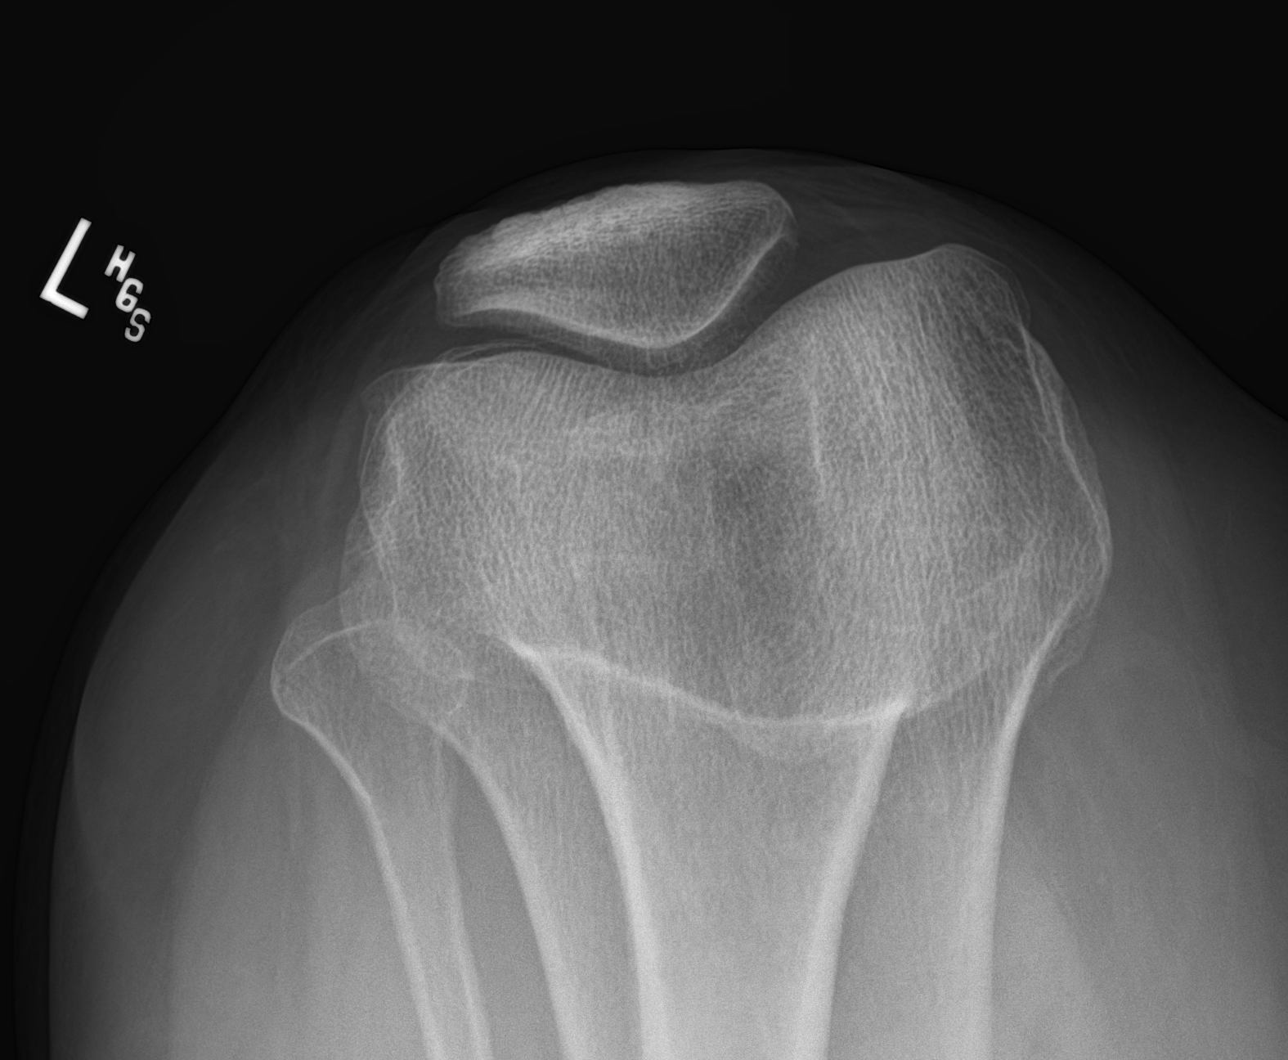

[4 of 4 positions shown; findings below may reference images not displayed]

FINDINGS: There is no evidence of fracture, subluxation, dislocation or joint
effusion.

Mild tricompartmental degenerative changes are identified.

No focal bony lesions are noted.
IMPRESSION: Mild tricompartmental degenerative changes.

## 2018-02-01 ENCOUNTER — Other Ambulatory Visit: Payer: Self-pay

## 2018-02-01 ENCOUNTER — Encounter: Payer: Self-pay | Admitting: Student in an Organized Health Care Education/Training Program

## 2018-02-01 ENCOUNTER — Ambulatory Visit
Payer: Medicare Other | Attending: Student in an Organized Health Care Education/Training Program | Admitting: Student in an Organized Health Care Education/Training Program

## 2018-02-01 VITALS — BP 133/74 | HR 65 | Temp 98.2°F | Resp 16 | Ht 63.0 in | Wt 227.0 lb

## 2018-02-01 DIAGNOSIS — Z8673 Personal history of transient ischemic attack (TIA), and cerebral infarction without residual deficits: Secondary | ICD-10-CM | POA: Diagnosis not present

## 2018-02-01 DIAGNOSIS — G894 Chronic pain syndrome: Secondary | ICD-10-CM | POA: Diagnosis not present

## 2018-02-01 DIAGNOSIS — M25511 Pain in right shoulder: Secondary | ICD-10-CM | POA: Diagnosis not present

## 2018-02-01 DIAGNOSIS — F17210 Nicotine dependence, cigarettes, uncomplicated: Secondary | ICD-10-CM | POA: Diagnosis not present

## 2018-02-01 DIAGNOSIS — M546 Pain in thoracic spine: Secondary | ICD-10-CM | POA: Diagnosis not present

## 2018-02-01 DIAGNOSIS — M25512 Pain in left shoulder: Secondary | ICD-10-CM | POA: Diagnosis not present

## 2018-02-01 DIAGNOSIS — M797 Fibromyalgia: Secondary | ICD-10-CM | POA: Diagnosis not present

## 2018-02-01 DIAGNOSIS — M25551 Pain in right hip: Secondary | ICD-10-CM | POA: Diagnosis not present

## 2018-02-01 DIAGNOSIS — E669 Obesity, unspecified: Secondary | ICD-10-CM | POA: Insufficient documentation

## 2018-02-01 DIAGNOSIS — I10 Essential (primary) hypertension: Secondary | ICD-10-CM | POA: Diagnosis not present

## 2018-02-01 DIAGNOSIS — Z79899 Other long term (current) drug therapy: Secondary | ICD-10-CM | POA: Diagnosis not present

## 2018-02-01 DIAGNOSIS — Z9049 Acquired absence of other specified parts of digestive tract: Secondary | ICD-10-CM | POA: Diagnosis not present

## 2018-02-01 DIAGNOSIS — Z7901 Long term (current) use of anticoagulants: Secondary | ICD-10-CM | POA: Diagnosis not present

## 2018-02-01 DIAGNOSIS — Z6841 Body Mass Index (BMI) 40.0 and over, adult: Secondary | ICD-10-CM | POA: Insufficient documentation

## 2018-02-01 DIAGNOSIS — I2699 Other pulmonary embolism without acute cor pulmonale: Secondary | ICD-10-CM | POA: Diagnosis not present

## 2018-02-01 DIAGNOSIS — N3281 Overactive bladder: Secondary | ICD-10-CM | POA: Diagnosis not present

## 2018-02-01 DIAGNOSIS — M25552 Pain in left hip: Secondary | ICD-10-CM | POA: Insufficient documentation

## 2018-02-01 DIAGNOSIS — J449 Chronic obstructive pulmonary disease, unspecified: Secondary | ICD-10-CM | POA: Insufficient documentation

## 2018-02-01 DIAGNOSIS — K219 Gastro-esophageal reflux disease without esophagitis: Secondary | ICD-10-CM | POA: Diagnosis not present

## 2018-02-01 DIAGNOSIS — Z8639 Personal history of other endocrine, nutritional and metabolic disease: Secondary | ICD-10-CM

## 2018-02-01 DIAGNOSIS — Z8249 Family history of ischemic heart disease and other diseases of the circulatory system: Secondary | ICD-10-CM | POA: Insufficient documentation

## 2018-02-01 DIAGNOSIS — G35 Multiple sclerosis: Secondary | ICD-10-CM | POA: Diagnosis not present

## 2018-02-01 DIAGNOSIS — F419 Anxiety disorder, unspecified: Secondary | ICD-10-CM | POA: Insufficient documentation

## 2018-02-01 NOTE — Progress Notes (Signed)
Safety precautions to be maintained throughout the outpatient stay will include: orient to surroundings, keep bed in low position, maintain call bell within reach at all times, provide assistance with transfer out of bed and ambulation.  

## 2018-02-01 NOTE — Progress Notes (Signed)
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 lesions. No contractures.  Skin & Extremity Inspection: Skin color, temperature, and hair growth are WNL. No peripheral edema or cyanosis. No masses, redness, swelling, asymmetry, or associated skin lesions. No contractures.  Functional ROM: Unrestricted ROM                  Functional ROM: Unrestricted ROM                  Muscle Tone/Strength: Functionally intact. No obvious neuro-muscular anomalies detected.  Muscle Tone/Strength: Functionally intact. No obvious neuro-muscular anomalies detected.  Sensory (Neurological): Unimpaired  Sensory (Neurological): Unimpaired  Palpation: No palpable anomalies  Palpation: No palpable anomalies   Assessment  Primary Diagnosis & Pertinent Problem List: The primary encounter diagnosis was Multiple sclerosis ([place]). Diagnoses of Fibromyalgia, H/O: obesity, Mild chronic obstructive pulmonary disease ([place]), Other pulmonary embolism without acute cor pulmonale, unspecified chronicity (HCC) (on Xarelto), and Chronic pain syndrome were also pertinent to this visit.  Visit Diagnosis (New problems to  examiner): 1. Multiple sclerosis ([place])   2. Fibromyalgia   3. H/O: obesity   4. Mild chronic obstructive pulmonary disease ([place])   5. Other pulmonary embolism without acute cor pulmonale, unspecified chronicity (HCC) (on Xarelto)   6. Chronic pain syndrome   General Recommendations: The pain condition that the patient suffers from is best treated with a multidisciplinary approach that involves an increase in physical activity to prevent de-conditioning and worsening of the pain cycle, as well as psychological counseling (formal and/or informal) to address the co-morbid psychological affects of pain. Treatment will often involve judicious use of pain medications and interventional procedures to decrease the pain, allowing the patient to participate in the physical activity that will ultimately produce long-lasting pain reductions. The goal of the multidisciplinary approach is to return the patient to a higher level of overall function and to restore their ability to perform activities of daily living.  52 year old female with a history of multiple sclerosis, fibromyalgia (MS diagnosed in 75) who presents for chronic pain management.  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  Patient is currently anticoagulated with Xarelto given her increased propensity for thrombo-embolism.  She is unable to tolerate NSAIDs and they should be avoided.  Patient has tried various pain medications in the past including tricyclic antidepressants, multiple pole muscle relaxants including baclofen, Flexeril, Robaxin, various opioid medications including tramadol which were not effective, Vicodin which was not effective, Percocet which was not effective, Demerol which was not effective, Tylenol with codeine which made patient dizzy.  She does find benefit with as needed hydrocodone.  She takes these medications very sparingly and  usually 90 tablets will last her over 3 to 4 months.  She was previously seen by a neurologist but is no longer under the care of a neurologist.  Today the patient complete a urine drug screen which is standard for new patients.  Pending UDS results, I can take over the patient's chronic opioid therapy which includes hydrocodone 5 mg as needed for  breakthrough pain.  Patient usually has 90 tablets that last over 3 to 4 months.  Plan of Care (Initial workup plan)  Note: Please be advised that as per protocol, today's visit has been an evaluation only. We have not taken over the patient's controlled substance management.  Ordered Lab-work, Procedure(s), Referral(s), & Consult(s): Orders Placed This Encounter  Procedures  . Compliance Drug Analysis, Ur   Pharmacological management options:  Opioid Analgesics: The patient was informed that there is no guarantee that she would be a candidate for opioid analgesics. The decision will be made following CDC guidelines. This decision will be based on the results of diagnostic studies, as well as Ms. Thelen's risk profile.   Membrane stabilizer: To be determined at a later time  Muscle relaxant: Tried and failed  NSAID: Medically contraindicated  Other analgesic(s): To be determined at a later time   Provider-requested follow-up: Return in about 6 days (around 02/07/2018) for Medication Management.  Future Appointments  Date Time Provider [place]  02/07/2018 12:00 PM Gillis Santa, MD [facility] None  07/05/2018 11:00 AM Lavera Guise, MD [place] None    Primary Care Physician: Lavera Guise, MD Location: [facility] Note by: Gillis Santa, M.D, Date: 02/01/2018; Time: 2:41 PM  There are no Patient Instructions on file for this visit.

## 2018-02-05 LAB — COMPLIANCE DRUG ANALYSIS, UR

## 2018-02-07 ENCOUNTER — Ambulatory Visit
Payer: Medicare Other | Attending: Student in an Organized Health Care Education/Training Program | Admitting: Student in an Organized Health Care Education/Training Program

## 2018-02-07 ENCOUNTER — Other Ambulatory Visit: Payer: Self-pay

## 2018-02-07 ENCOUNTER — Encounter: Payer: Self-pay | Admitting: Student in an Organized Health Care Education/Training Program

## 2018-02-07 VITALS — BP 100/78 | HR 74 | Temp 98.6°F | Resp 16 | Ht 63.0 in | Wt 227.0 lb

## 2018-02-07 DIAGNOSIS — Z79891 Long term (current) use of opiate analgesic: Secondary | ICD-10-CM | POA: Diagnosis not present

## 2018-02-07 DIAGNOSIS — M25552 Pain in left hip: Secondary | ICD-10-CM | POA: Diagnosis not present

## 2018-02-07 DIAGNOSIS — F419 Anxiety disorder, unspecified: Secondary | ICD-10-CM | POA: Insufficient documentation

## 2018-02-07 DIAGNOSIS — N3946 Mixed incontinence: Secondary | ICD-10-CM | POA: Diagnosis not present

## 2018-02-07 DIAGNOSIS — K219 Gastro-esophageal reflux disease without esophagitis: Secondary | ICD-10-CM | POA: Insufficient documentation

## 2018-02-07 DIAGNOSIS — Z880 Allergy status to penicillin: Secondary | ICD-10-CM | POA: Diagnosis not present

## 2018-02-07 DIAGNOSIS — G894 Chronic pain syndrome: Secondary | ICD-10-CM

## 2018-02-07 DIAGNOSIS — G35 Multiple sclerosis: Secondary | ICD-10-CM

## 2018-02-07 DIAGNOSIS — Z881 Allergy status to other antibiotic agents status: Secondary | ICD-10-CM | POA: Diagnosis not present

## 2018-02-07 DIAGNOSIS — M549 Dorsalgia, unspecified: Secondary | ICD-10-CM | POA: Diagnosis not present

## 2018-02-07 DIAGNOSIS — Z8639 Personal history of other endocrine, nutritional and metabolic disease: Secondary | ICD-10-CM

## 2018-02-07 DIAGNOSIS — Z79899 Other long term (current) drug therapy: Secondary | ICD-10-CM | POA: Diagnosis not present

## 2018-02-07 DIAGNOSIS — I2699 Other pulmonary embolism without acute cor pulmonale: Secondary | ICD-10-CM

## 2018-02-07 DIAGNOSIS — J449 Chronic obstructive pulmonary disease, unspecified: Secondary | ICD-10-CM

## 2018-02-07 DIAGNOSIS — Z9889 Other specified postprocedural states: Secondary | ICD-10-CM | POA: Diagnosis not present

## 2018-02-07 DIAGNOSIS — Z86711 Personal history of pulmonary embolism: Secondary | ICD-10-CM | POA: Diagnosis not present

## 2018-02-07 DIAGNOSIS — I10 Essential (primary) hypertension: Secondary | ICD-10-CM | POA: Diagnosis not present

## 2018-02-07 DIAGNOSIS — M25561 Pain in right knee: Secondary | ICD-10-CM | POA: Insufficient documentation

## 2018-02-07 DIAGNOSIS — M797 Fibromyalgia: Secondary | ICD-10-CM | POA: Diagnosis not present

## 2018-02-07 DIAGNOSIS — Z8249 Family history of ischemic heart disease and other diseases of the circulatory system: Secondary | ICD-10-CM | POA: Insufficient documentation

## 2018-02-07 DIAGNOSIS — Z8673 Personal history of transient ischemic attack (TIA), and cerebral infarction without residual deficits: Secondary | ICD-10-CM | POA: Diagnosis not present

## 2018-02-07 DIAGNOSIS — N3281 Overactive bladder: Secondary | ICD-10-CM | POA: Insufficient documentation

## 2018-02-07 DIAGNOSIS — F17210 Nicotine dependence, cigarettes, uncomplicated: Secondary | ICD-10-CM | POA: Diagnosis not present

## 2018-02-07 DIAGNOSIS — Z888 Allergy status to other drugs, medicaments and biological substances status: Secondary | ICD-10-CM | POA: Insufficient documentation

## 2018-02-07 DIAGNOSIS — Z9049 Acquired absence of other specified parts of digestive tract: Secondary | ICD-10-CM | POA: Insufficient documentation

## 2018-02-07 DIAGNOSIS — E669 Obesity, unspecified: Secondary | ICD-10-CM | POA: Diagnosis not present

## 2018-02-07 DIAGNOSIS — R3129 Other microscopic hematuria: Secondary | ICD-10-CM | POA: Diagnosis not present

## 2018-02-07 DIAGNOSIS — Z8042 Family history of malignant neoplasm of prostate: Secondary | ICD-10-CM | POA: Insufficient documentation

## 2018-02-07 MED ORDER — HYDROCODONE-ACETAMINOPHEN 5-325 MG PO TABS: 1.0000 | ORAL_TABLET | Freq: Two times a day (BID) | ORAL | 0 refills | Status: DC | PRN

## 2018-02-07 NOTE — Progress Notes (Signed)
Safety precautions to be maintained throughout the outpatient stay will include: orient to surroundings, keep bed in low position, maintain call bell within reach at all times, provide assistance with transfer out of bed and ambulation.  

## 2018-02-07 NOTE — Progress Notes (Signed)
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 medications very sparingly and usually 90 tablets will last her over 3 to 4 months.  She was previously seen by a neurologist but is no longer under the care of a neurologist.  UDS reviewed. Rx provided below.   Plan of Care  Pharmacotherapy (Medications Ordered): Meds ordered this encounter  Medications  . HYDROcodone-acetaminophen (NORCO/VICODIN) 5-325 MG tablet    Sig: Take 1 tablet by mouth 2 (two) times daily as needed for moderate pain. For chronic pain    Dispense:  75 tablet    Refill:  0   Pharmacological management options:  Opioid Analgesics: We'll take over management today. See above orders Membrane stabilizer: We have discussed the possibility of optimizing this mode of therapy, if tolerated Muscle relaxant: We have discussed the possibility of a trial NSAID: Medically contraindicated Other analgesic(s): To be determined at a later time   Provider-requested follow-up: Return in about 3 months (around 05/10/2018) for Medication Management. Time  Note: Greater than 50% of the 25 minute(s) of face-to-face time spent with Ms. Rodak, was spent in counseling/coordination of care regarding: the appropriate use of the pain scale, Ms. Lagan's primary cause of pain, the treatment plan, treatment alternatives, the risks and possible complications of proposed treatment, medication side effects, the opioid analgesic risks and possible complications, the appropriate use of her medications, realistic expectations, the goals of pain management (increased in functionality), the need to bring and keep the BMI below 30, the medication agreement and the patient's responsibilities when it comes to controlled substances.   Future Appointments  Date Time Provider [place]  05/10/2018 11:30 AM Gillis Santa, MD [facility] None  07/05/2018 11:00 AM Lavera Guise, MD [place] None    Primary Care Physician: Lavera Guise, MD Location: [facility] Note by: Gillis Santa, M.D Date: 02/07/2018; Time: 2:17 PM  Patient Instructions  1. Sign contract 2. Rx for Hydrocodone

## 2018-02-07 NOTE — Patient Instructions (Addendum)
1. Sign contract 2. Rx for Hydrocodone

## 2018-02-14 DIAGNOSIS — L638 Other alopecia areata: Secondary | ICD-10-CM | POA: Diagnosis not present

## 2018-03-02 DIAGNOSIS — N761 Subacute and chronic vaginitis: Secondary | ICD-10-CM | POA: Diagnosis not present

## 2018-03-10 ENCOUNTER — Other Ambulatory Visit: Payer: Self-pay | Admitting: Urology

## 2018-03-31 IMAGING — US US EXTREM LOW VENOUS*R*
1 series · 13 of 24 positions shown · non-contrast
Comparison: None.

CLINICAL DATA: Right lower extremity pain and swelling. Recently
postop from right knee surgery.



[Series 1: us extrem low venous*right* · 0.10mm/px · 38 acquisitions, 13 frames shown]
[im 1/38]
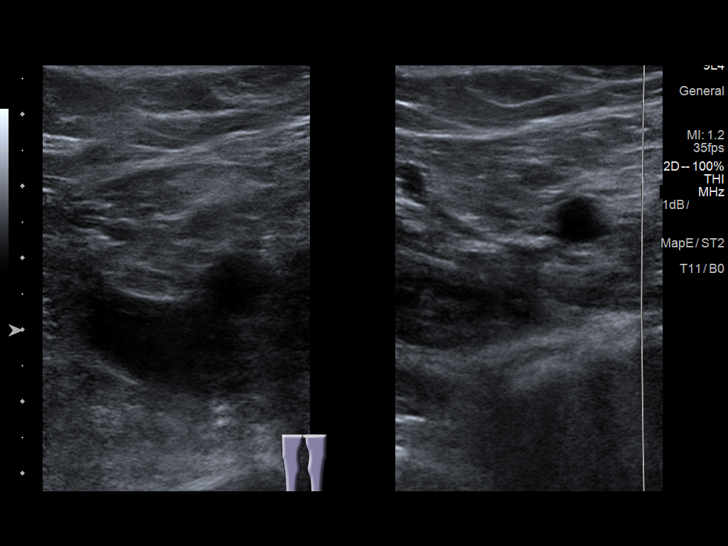
[im 4/38]
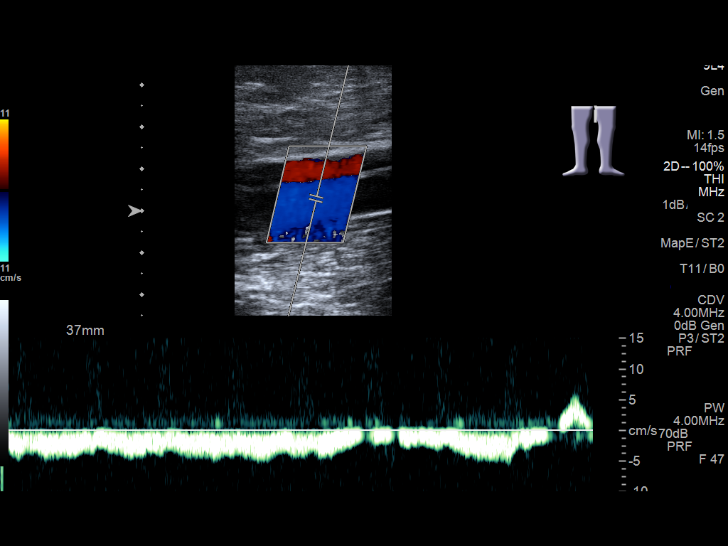
[im 7/38]
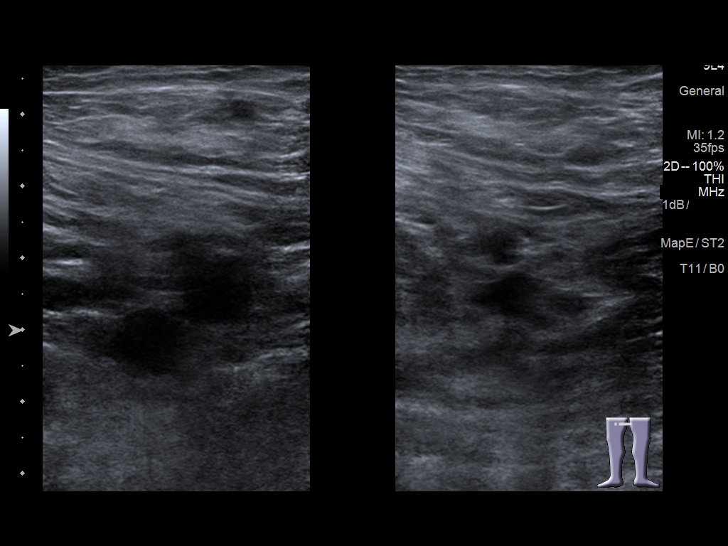
[im 10/38]
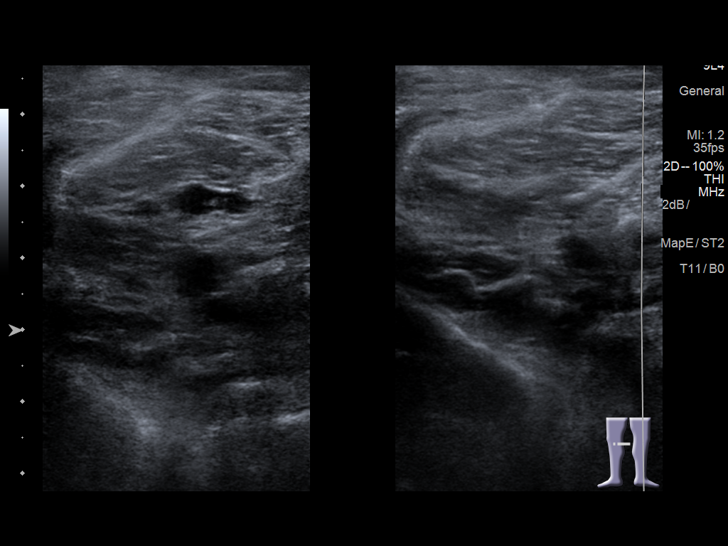
[im 13/38]
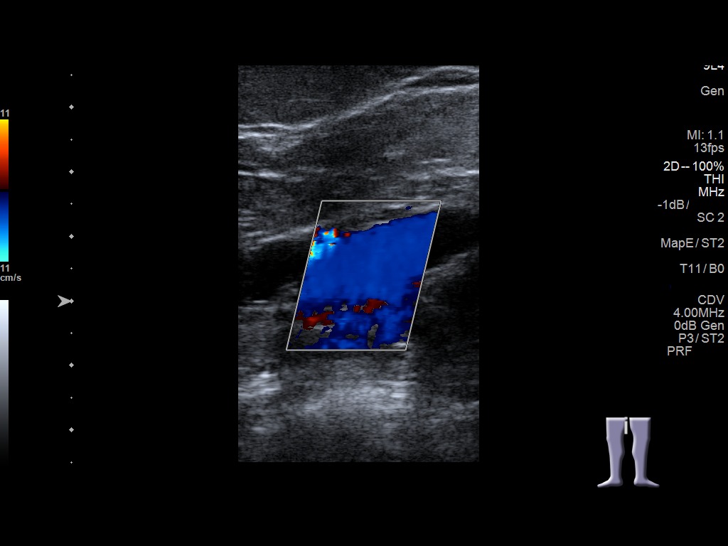
[im 17/38]
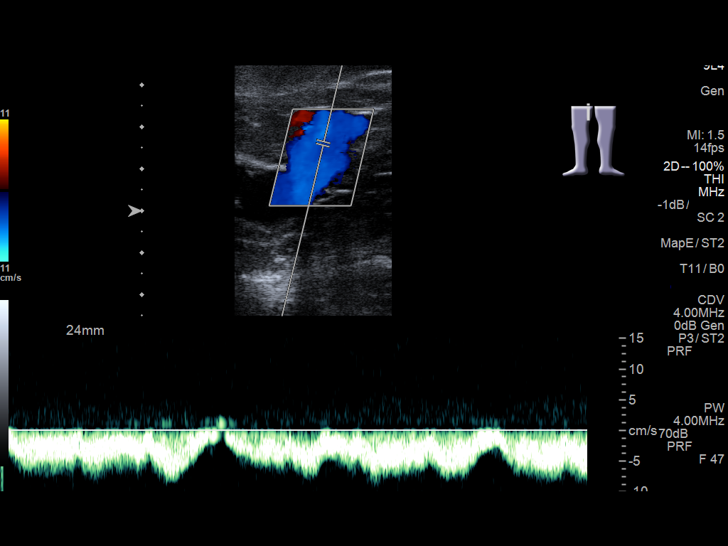
[im 21/38]
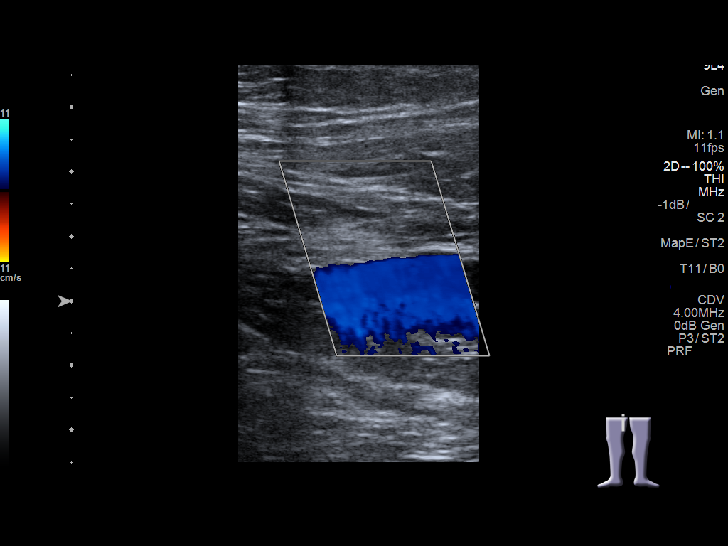
[im 23/38]
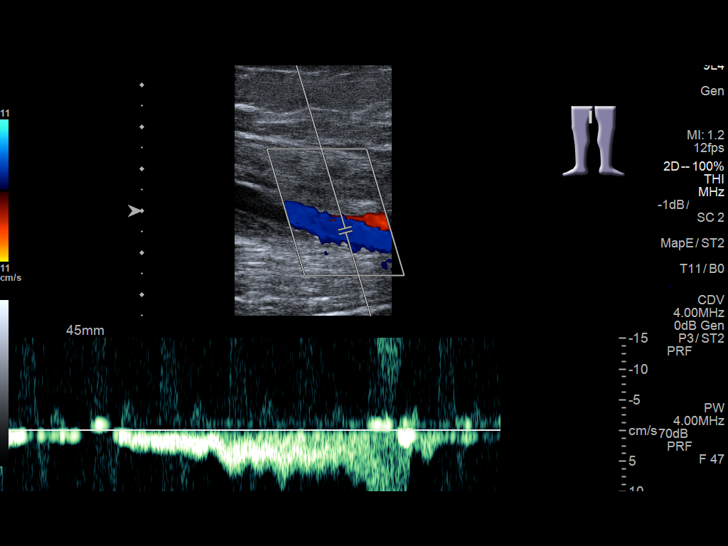
[im 26/38]
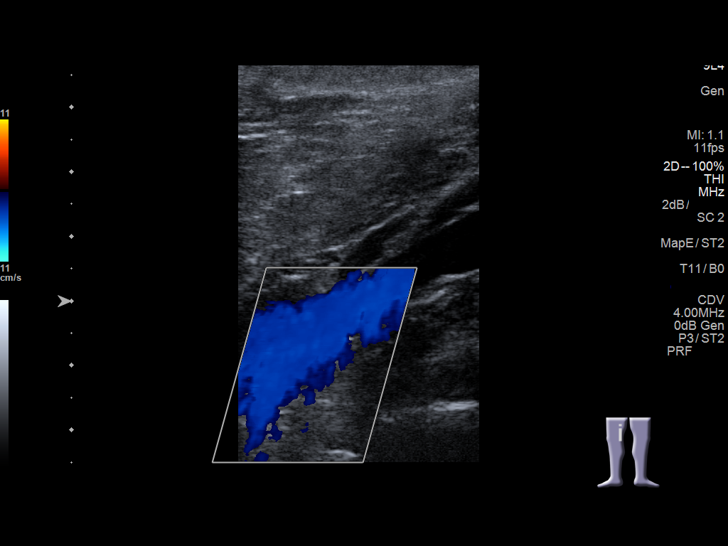
[im 29/38]
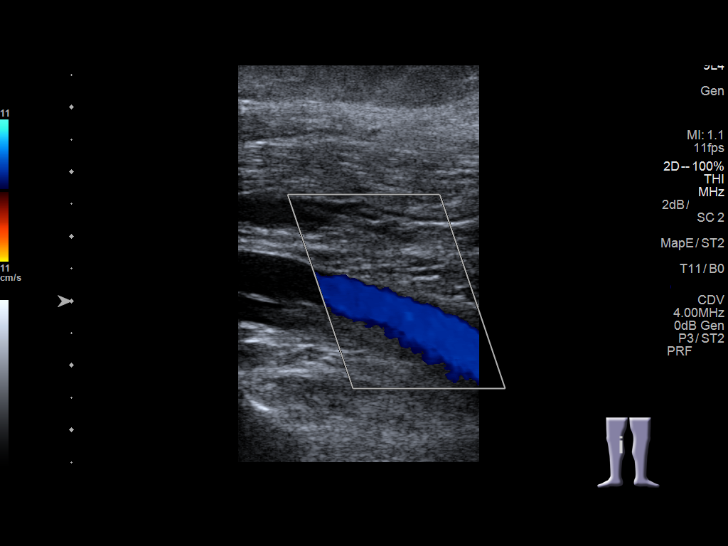
[im 33/38]
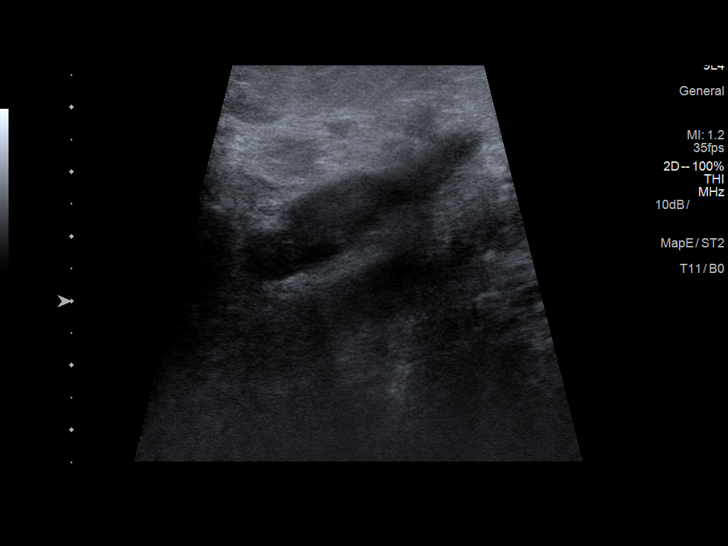
[im 36/38]
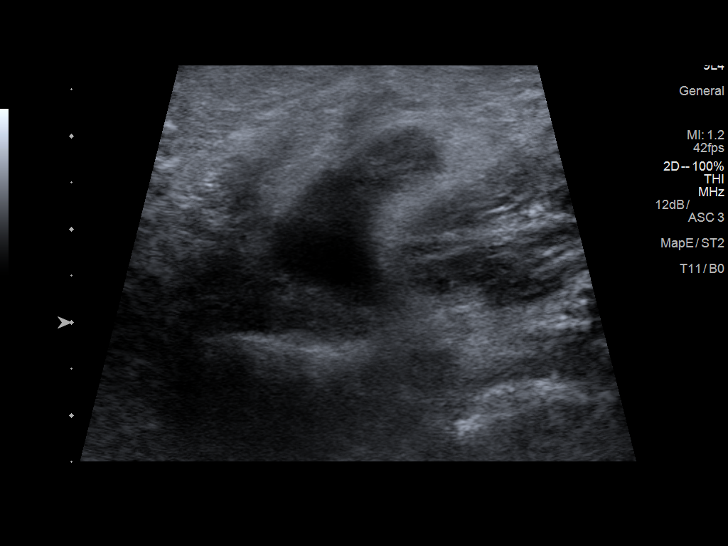
[im 38/38]
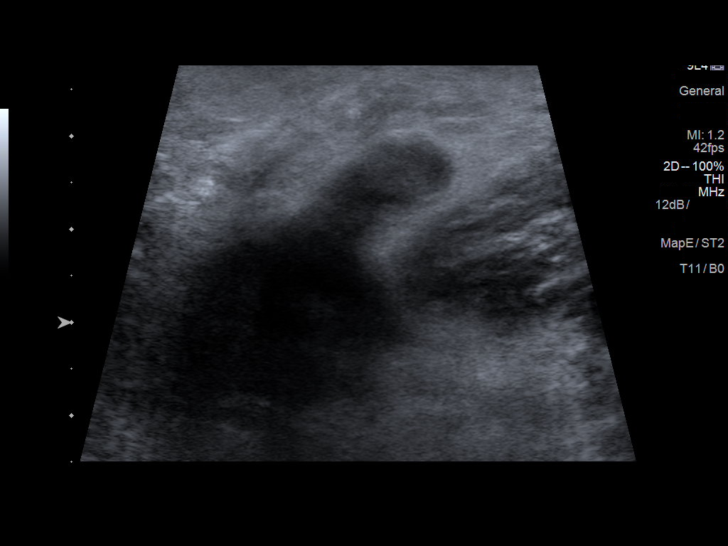

[13 of 24 positions shown; findings below may reference images not displayed]

FINDINGS: Contralateral Common Femoral Vein: Respiratory phasicity is normal
and symmetric with the symptomatic side. No evidence of thrombus.
Normal compressibility.

Common Femoral Vein: No evidence of thrombus. Normal
compressibility, respiratory phasicity and response to augmentation.

Saphenofemoral Junction: No evidence of thrombus. Normal
compressibility and flow on color Doppler imaging.

Profunda Femoral Vein: No evidence of thrombus. Normal
compressibility and flow on color Doppler imaging.

Femoral Vein: No evidence of thrombus. Normal compressibility,
respiratory phasicity and response to augmentation.

Popliteal Vein: No evidence of thrombus. Normal compressibility,
respiratory phasicity and response to augmentation.

Calf Veins: No evidence of thrombus. Normal compressibility and flow
on color Doppler imaging.

Superficial Great Saphenous Vein: No evidence of thrombus. Normal
compressibility.

Venous Reflux:  None.

Other Findings: Small hypoechoic fluid collection in the right
popliteal fossa measuring 4.3 x 1.0 x 1.2 cm. This could represent a
Baker's cyst or postoperative fluid collection.
IMPRESSION: No evidence of deep venous thrombosis.

Small fluid collection in right popliteal fossa, which could
represent a Baker's cyst or postoperative collection.

## 2018-04-19 ENCOUNTER — Other Ambulatory Visit: Payer: Self-pay | Admitting: Urology

## 2018-04-22 ENCOUNTER — Other Ambulatory Visit: Payer: Self-pay | Admitting: Internal Medicine

## 2018-04-22 ENCOUNTER — Other Ambulatory Visit: Payer: Self-pay

## 2018-04-22 DIAGNOSIS — F410 Panic disorder [episodic paroxysmal anxiety] without agoraphobia: Secondary | ICD-10-CM

## 2018-04-22 DIAGNOSIS — F411 Generalized anxiety disorder: Principal | ICD-10-CM

## 2018-04-22 DIAGNOSIS — M797 Fibromyalgia: Secondary | ICD-10-CM

## 2018-04-22 MED ORDER — LYRICA 150 MG PO CAPS: 150.0000 mg | ORAL_CAPSULE | Freq: Two times a day (BID) | ORAL | 0 refills | Status: DC

## 2018-04-22 NOTE — Telephone Encounter (Signed)
Called in rx for Lyrica at [facility]. Spoke with Feliz Beam, Lyrica was refilled quantity 180 with 0 refills per [facility].

## 2018-04-26 DIAGNOSIS — R1031 Right lower quadrant pain: Secondary | ICD-10-CM | POA: Insufficient documentation

## 2018-05-03 ENCOUNTER — Other Ambulatory Visit: Payer: Self-pay | Admitting: Family Medicine

## 2018-05-03 DIAGNOSIS — N3941 Urge incontinence: Secondary | ICD-10-CM

## 2018-05-03 MED ORDER — OXYBUTYNIN CHLORIDE ER 15 MG PO TB24: 15.0000 mg | ORAL_TABLET | Freq: Every day | ORAL | 4 refills | Status: DC

## 2018-05-05 DIAGNOSIS — R0609 Other forms of dyspnea: Secondary | ICD-10-CM | POA: Diagnosis not present

## 2018-05-10 ENCOUNTER — Encounter: Payer: Self-pay | Admitting: Student in an Organized Health Care Education/Training Program

## 2018-05-10 ENCOUNTER — Other Ambulatory Visit: Payer: Self-pay

## 2018-05-10 ENCOUNTER — Ambulatory Visit
Payer: Medicare Other | Attending: Student in an Organized Health Care Education/Training Program | Admitting: Student in an Organized Health Care Education/Training Program

## 2018-05-10 VITALS — BP 132/90 | HR 73 | Temp 98.4°F | Resp 18 | Ht 63.0 in | Wt 224.0 lb

## 2018-05-10 DIAGNOSIS — E669 Obesity, unspecified: Secondary | ICD-10-CM | POA: Diagnosis not present

## 2018-05-10 DIAGNOSIS — Z7901 Long term (current) use of anticoagulants: Secondary | ICD-10-CM | POA: Diagnosis not present

## 2018-05-10 DIAGNOSIS — Z6839 Body mass index (BMI) 39.0-39.9, adult: Secondary | ICD-10-CM | POA: Insufficient documentation

## 2018-05-10 DIAGNOSIS — G894 Chronic pain syndrome: Secondary | ICD-10-CM | POA: Diagnosis not present

## 2018-05-10 DIAGNOSIS — Z86718 Personal history of other venous thrombosis and embolism: Secondary | ICD-10-CM | POA: Insufficient documentation

## 2018-05-10 DIAGNOSIS — Z8639 Personal history of other endocrine, nutritional and metabolic disease: Secondary | ICD-10-CM | POA: Diagnosis not present

## 2018-05-10 DIAGNOSIS — Z8673 Personal history of transient ischemic attack (TIA), and cerebral infarction without residual deficits: Secondary | ICD-10-CM | POA: Diagnosis not present

## 2018-05-10 DIAGNOSIS — I2699 Other pulmonary embolism without acute cor pulmonale: Secondary | ICD-10-CM | POA: Diagnosis not present

## 2018-05-10 DIAGNOSIS — G35 Multiple sclerosis: Secondary | ICD-10-CM | POA: Insufficient documentation

## 2018-05-10 DIAGNOSIS — N3281 Overactive bladder: Secondary | ICD-10-CM | POA: Diagnosis not present

## 2018-05-10 DIAGNOSIS — Z8249 Family history of ischemic heart disease and other diseases of the circulatory system: Secondary | ICD-10-CM | POA: Insufficient documentation

## 2018-05-10 DIAGNOSIS — M1710 Unilateral primary osteoarthritis, unspecified knee: Secondary | ICD-10-CM | POA: Diagnosis not present

## 2018-05-10 DIAGNOSIS — Z86711 Personal history of pulmonary embolism: Secondary | ICD-10-CM | POA: Diagnosis not present

## 2018-05-10 DIAGNOSIS — F17210 Nicotine dependence, cigarettes, uncomplicated: Secondary | ICD-10-CM | POA: Diagnosis not present

## 2018-05-10 DIAGNOSIS — K219 Gastro-esophageal reflux disease without esophagitis: Secondary | ICD-10-CM | POA: Diagnosis not present

## 2018-05-10 DIAGNOSIS — J449 Chronic obstructive pulmonary disease, unspecified: Secondary | ICD-10-CM | POA: Insufficient documentation

## 2018-05-10 DIAGNOSIS — Z881 Allergy status to other antibiotic agents status: Secondary | ICD-10-CM | POA: Diagnosis not present

## 2018-05-10 DIAGNOSIS — Z880 Allergy status to penicillin: Secondary | ICD-10-CM | POA: Insufficient documentation

## 2018-05-10 DIAGNOSIS — Z760 Encounter for issue of repeat prescription: Secondary | ICD-10-CM | POA: Diagnosis not present

## 2018-05-10 DIAGNOSIS — Z79899 Other long term (current) drug therapy: Secondary | ICD-10-CM | POA: Insufficient documentation

## 2018-05-10 DIAGNOSIS — I10 Essential (primary) hypertension: Secondary | ICD-10-CM | POA: Insufficient documentation

## 2018-05-10 DIAGNOSIS — N3946 Mixed incontinence: Secondary | ICD-10-CM | POA: Diagnosis not present

## 2018-05-10 DIAGNOSIS — M797 Fibromyalgia: Secondary | ICD-10-CM | POA: Diagnosis not present

## 2018-05-10 MED ORDER — HYDROCODONE-ACETAMINOPHEN 7.5-325 MG PO TABS: 1.0000 | ORAL_TABLET | Freq: Two times a day (BID) | ORAL | 0 refills | Status: DC | PRN

## 2018-05-10 NOTE — Progress Notes (Signed)
Patient's Name: Tracey Chandler  MRN: [id number]  Referring Provider: Lavera Guise, MD  DOB: 06-06-1966  PCP: Lavera Guise, MD  DOS: 05/10/2018  Note by: Gillis Santa, MD  Service setting: Ambulatory outpatient  Specialty: Interventional Pain Management  Location: [facility] (AMB) Pain Management Facility    Patient type: Established   Primary Reason(s) for Visit: Encounter for prescription drug management. (Level of risk: moderate)  CC: Medication Refill  HPI  Tracey Chandler is a 52 y.o. year old, female patient, who comes today for a medication management evaluation. She has Pulmonary embolism ([place]); Abnormal cells of cervix; Mild chronic obstructive pulmonary disease ([place]); Discharge from the vagina; Fibromyalgia; Multiple sclerosis ([place]); Snores; H/O: obesity; Urge urinary incontinence; Microscopic hematuria; Osteoarthritis of knee; and Abdominal pain, acute, right lower quadrant on their problem list. Her primarily concern today is the Medication Refill  Pain Assessment: Location: Lower, Right, Left Back Radiating: Radiates from lower back to hips bilateral. Pain also in knees bialteral.  Onset: More than a month ago Duration: Chronic pain Quality: Constant, Discomfort Severity: 5 /10 (subjective, self-reported pain score)  Note: Reported level is compatible with observation.                         When using our objective Pain Scale, levels between 6 and 10/10 are said to belong in an emergency room, as it progressively worsens from a 6/10, described as severely limiting, requiring emergency care not usually available at an outpatient pain management facility. At a 6/10 level, communication becomes difficult and requires great effort. Assistance to reach the emergency department may be required. Facial flushing and profuse sweating along with potentially dangerous increases in heart rate and blood pressure will be evident. Effect on ADL: "On bad days I do very little. Otherwise, I learned to manage"   Timing: Constant Modifying factors: Medications  BP: 132/90  HR: 73  Tracey Chandler was last scheduled for an appointment on 02/07/2018 for medication management. During today's appointment we reviewed Tracey Chandler's chronic pain status, as well as her outpatient medication regimen.  The patient  reports that she does not use drugs. Her body mass index is 39.68 kg/m.  Further details on both, my assessment(s), as well as the proposed treatment plan, please see below.  Controlled Substance Pharmacotherapy Assessment REMS (Risk Evaluation and Mitigation Strategy)  Analgesic: Hydrocodone 5 mg as needed breakthrough pain (2-3 times a week) MME/day: Less than 5 mg/day. Tracey Napoleon, RN  05/10/2018 11:53 AM  Sign at close encounter Nursing Pain Medication Assessment:  Safety precautions to be maintained throughout the outpatient stay will include: orient to surroundings, keep bed in low position, maintain call bell within reach at all times, provide assistance with transfer out of bed and ambulation.   Medication Inspection Compliance: Pill count conducted under aseptic conditions, in front of the patient. Neither the pills nor the bottle was removed from the patient's sight at any time. Once count was completed pills were immediately returned to the patient in their original bottle.  Medication: Hydrocodone/APAP Pill/Patch Count: 31 of 60 pills remain Pill/Patch Appearance: Markings consistent with prescribed medication Bottle Appearance: Standard pharmacy container. Clearly labeled. Filled Date: 07 / 31 / 2019 Last Medication intake:  Ran out of medicine more than 48 hours ago Pharmacokinetics: Liberation and absorption (onset of action): WNL Distribution (time to peak effect): WNL Metabolism and excretion (duration of action): WNL         Pharmacodynamics: Desired  effects: Analgesia: Ms. Amerman reports >50% benefit. Functional ability: Patient reports that medication allows her to  accomplish basic ADLs Clinically meaningful improvement in function (CMIF): Sustained CMIF goals met Perceived effectiveness: Described as relatively effective but with some room for improvement Undesirable effects: Side-effects or Adverse reactions: None reported Monitoring: [place] PMP: Online review of the past 36-monthperiod conducted. Compliant with practice rules and regulations Last UDS on record: Summary  Date Value Ref Range Status  02/01/2018 FINAL  Final    Comment:    ==================================================================== TOXASSURE COMP DRUG ANALYSIS,UR ==================================================================== Test                             Result       Flag       Units Drug Present not Declared for Prescription Verification   Pregabalin                     PRESENT      UNEXPECTED   Sertraline                     PRESENT      UNEXPECTED   Desmethylsertraline            PRESENT      UNEXPECTED    Desmethylsertraline is an expected metabolite of sertraline. Drug Absent but Declared for Prescription Verification   Hydrocodone                    Not Detected UNEXPECTED ng/mg creat   Acetaminophen                  Not Detected UNEXPECTED    Acetaminophen, as indicated in the declared medication list, is    not always detected even when used as directed. ==================================================================== Test                      Result    Flag   Units      Ref Range   Creatinine              97               mg/dL      >=20 ==================================================================== Declared Medications:  The flagging and interpretation on this report are based on the  following declared medications.  Unexpected results may arise from  inaccuracies in the declared medications.  **Note: The testing scope of this panel includes these medications:  Hydrocodone (Hydrocodone-Acetaminophen)  **Note: The testing scope of this panel  does not include small to  moderate amounts of these reported medications:  Acetaminophen (Hydrocodone-Acetaminophen)  **Note: The testing scope of this panel does not include following  reported medications:  Albuterol  Betamethasone  Buspirone  Calcium carbonate (Calcium Carb/Vitamin D)  Clobetasol (Temovate)  Fluticasone (Flonase)  Meclizine  Mirabegron (Myrbetriq)  Oxybutynin  Vitamin D (Calcium Carb/Vitamin D)  Vitamin D3 ==================================================================== For clinical consultation, please call ([phone] ====================================================================    UDS interpretation: Compliant          Medication Assessment Form: Reviewed. Patient indicates being compliant with therapy Treatment compliance: Compliant Risk Assessment Profile: Aberrant behavior: See prior evaluations. None observed or detected today Comorbid factors increasing risk of overdose: See prior notes. No additional risks detected today Opioid risk tool (ORT) (Total Score): 2 Personal History of Substance Abuse (SUD-Substance use disorder):  Alcohol: Negative  Illegal Drugs: Negative  Rx  Drugs: Negative  ORT Risk Level calculation: Low Risk Risk of substance use disorder (SUD): Low Opioid Risk Tool - 05/10/18 1152      Family History of Substance Abuse   Alcohol  Negative    Illegal Drugs  Negative    Rx Drugs  Negative      Personal History of Substance Abuse   Alcohol  Negative    Illegal Drugs  Negative    Rx Drugs  Negative      Age   Age between 53-45 years   No      Psychological Disease   Psychological Disease  Positive   Anxiety    ADD  Negative    OCD  Negative    Bipolar  Negative    Schizophrenia  Negative    Depression  Negative      Total Score   Opioid Risk Tool Scoring  2    Opioid Risk Interpretation  Low Risk      ORT Scoring interpretation table:  Score <3 = Low Risk for SUD  Score between 4-7 = Moderate  Risk for SUD  Score >8 = High Risk for Opioid Abuse   Risk Mitigation Strategies:  Patient Counseling: Covered Patient-Prescriber Agreement (PPA): Present and active  Notification to other healthcare providers: Done  Pharmacologic Plan: Patient takes medications very sporadically but requesting 7.5 mg instead which is reasonable             Laboratory Chemistry  Inflammation Markers (CRP: Acute Phase) (ESR: Chronic Phase) No results found for: CRP, ESRSEDRATE, LATICACIDVEN                       Rheumatology Markers No results found for: RF, ANA, LABURIC, URICUR, LYMEIGGIGMAB, LYMEABIGMQN, HLAB27                      Renal Function Markers Lab Results  Component Value Date   BUN 15 09/12/2017   CREATININE 0.75 09/12/2017   GFRAA >60 09/12/2017   GFRNONAA >60 09/12/2017                             Hepatic Function Markers Lab Results  Component Value Date   AST 21 03/30/2014   ALT 26 03/30/2014   ALBUMIN 3.1 (L) 03/30/2014   ALKPHOS 73 03/30/2014                        Electrolytes Lab Results  Component Value Date   NA 139 09/12/2017   K 3.9 09/12/2017   CL 104 09/12/2017   CALCIUM 9.5 09/12/2017                        Neuropathy Markers No results found for: VITAMINB12, FOLATE, HGBA1C, HIV                      CNS Tests No results found for: COLORCSF, APPEARCSF, RBCCOUNTCSF, WBCCSF, POLYSCSF, LYMPHSCSF, EOSCSF, PROTEINCSF, GLUCCSF, JCVIRUS, CSFOLI, IGGCSF                      Bone Pathology Markers No results found for: [place], [id number], PX1062IR4, [id number], 25OHVITD1, 25OHVITD2, 25OHVITD3, TESTOFREE, TESTOSTERONE                       Coagulation Parameters Lab Results  Component Value  Date   INR 2.00 09/12/2017   LABPROT 22.5 (H) 09/12/2017   APTT 115.1 (H) 03/30/2014   PLT 146 (L) 09/12/2017                        Cardiovascular Markers Lab Results  Component Value Date   TROPONINI < 0.02 03/30/2014   HGB 15.1 09/12/2017   HCT 44.6 09/12/2017                          CA Markers No results found for: CEA, CA125, LABCA2                      Note: Lab results reviewed.  Recent Diagnostic Imaging Results  US Venous Img Lower Unilateral Right CLINICAL DATA:  Right lower extremity pain and swelling. Recently postop from right knee surgery.  EXAM: RIGHT LOWER EXTREMITY VENOUS DOPPLER ULTRASOUND  TECHNIQUE: Gray-scale sonography with graded compression, as well as color Doppler and duplex ultrasound were performed to evaluate the lower extremity deep venous systems from the level of the common femoral vein and including the common femoral, femoral, profunda femoral, popliteal and calf veins including the posterior tibial, peroneal and gastrocnemius veins when visible. The superficial great saphenous vein was also interrogated. Spectral Doppler was utilized to evaluate flow at rest and with distal augmentation maneuvers in the common femoral, femoral and popliteal veins.  COMPARISON:  None.  FINDINGS: Contralateral Common Femoral Vein: Respiratory phasicity is normal and symmetric with the symptomatic side. No evidence of thrombus. Normal compressibility.  Common Femoral Vein: No evidence of thrombus. Normal compressibility, respiratory phasicity and response to augmentation.  Saphenofemoral Junction: No evidence of thrombus. Normal compressibility and flow on color Doppler imaging.  Profunda Femoral Vein: No evidence of thrombus. Normal compressibility and flow on color Doppler imaging.  Femoral Vein: No evidence of thrombus. Normal compressibility, respiratory phasicity and response to augmentation.  Popliteal Vein: No evidence of thrombus. Normal compressibility, respiratory phasicity and response to augmentation.  Calf Veins: No evidence of thrombus. Normal compressibility and flow on color Doppler imaging.  Superficial Great Saphenous Vein: No evidence of thrombus. Normal compressibility.  Venous Reflux:   None.  Other Findings: Small hypoechoic fluid collection in the right popliteal fossa measuring 4.3 x 1.0 x 1.2 cm. This could represent a Baker's cyst or postoperative fluid collection.  IMPRESSION: No evidence of deep venous thrombosis.  Small fluid collection in right popliteal fossa, which could represent a Baker's cyst or postoperative collection.  Electronically Signed   By: Earle Gell M.D.   On: 09/12/2017 18:28  Complexity Note: Imaging results reviewed. Results shared with Ms. Morsch, using Layman's terms.                         Meds   Current Outpatient Medications:  .  augmented betamethasone dipropionate (DIPROLENE-AF) 0.05 % ointment, Apply topically 2 (two) times daily., Disp: , Rfl:  .  busPIRone (BUSPAR) 5 MG tablet, TAKE 1 TABLET BY MOUTH 3  TIMES DAILY AS NEEDED, Disp: 270 tablet, Rfl: 0 .  Calcium Carbonate-Vitamin D (CALCIUM-VITAMIN D3 PO), Take 1 tablet by mouth daily., Disp: , Rfl:  .  Cholecalciferol (VITAMIN D3) 2000 UNITS capsule, Take 2,000 Units by mouth daily. , Disp: , Rfl:  .  clobetasol (TEMOVATE) 0.05 % external solution, Apply 1 application topically 2 (two) times daily., Disp: , Rfl:  .  fluticasone (FLONASE) 50 MCG/ACT nasal spray, Place 1 spray into both nostrils daily as needed for allergies. , Disp: , Rfl:  .  LYRICA 150 MG capsule, Take 1 capsule (150 mg total) by mouth 2 (two) times daily., Disp: 180 capsule, Rfl: 0 .  meclizine (ANTIVERT) 25 MG tablet, TAKE 1 TABLET BY MOUTH 3  TIMES A DAY AS NEEDED FOR  DIZZINESS, Disp: 270 tablet, Rfl: 0 .  mirabegron ER (MYRBETRIQ) 25 MG TB24 tablet, Take 1 tablet (25 mg total) by mouth daily., Disp: 90 tablet, Rfl: 3 .  oxybutynin (DITROPAN XL) 15 MG 24 hr tablet, Take 1 tablet (15 mg total) by mouth daily., Disp: 90 tablet, Rfl: 4 .  pantoprazole (PROTONIX) 40 MG tablet, Take 1 tablet (40 mg total) by mouth daily., Disp: 90 tablet, Rfl: 4 .  sertraline (ZOLOFT) 100 MG tablet, Take 1.5 tablets (150 mg  total) by mouth daily., Disp: 135 tablet, Rfl: 0 .  valACYclovir (VALTREX) 500 MG tablet, Take 1 tablet (500 mg total) by mouth daily. (Patient taking differently: Take 1,000 mg by mouth daily as needed (for out breaks). ), Disp: 90 tablet, Rfl: 4 .  XARELTO 20 MG TABS tablet, Take 1 tablet (20 mg total) by mouth daily., Disp: 90 tablet, Rfl: 4 .  albuterol (PROAIR HFA) 108 (90 BASE) MCG/ACT inhaler, Inhale 2 puffs into the lungs every 6 (six) hours as needed for wheezing or shortness of breath. , Disp: , Rfl:  .  HYDROcodone-acetaminophen (NORCO) 7.5-325 MG tablet, Take 1 tablet by mouth 2 (two) times daily as needed for moderate pain., Disp: 60 tablet, Rfl: 0  ROS  Constitutional: Denies any fever or chills Gastrointestinal: No reported hemesis, hematochezia, vomiting, or acute GI distress Musculoskeletal: Denies any acute onset joint swelling, redness, loss of ROM, or weakness Neurological: No reported episodes of acute onset apraxia, aphasia, dysarthria, agnosia, amnesia, paralysis, loss of coordination, or loss of consciousness  Allergies  Ms. Tregre is allergic to cefaclor; penicillins; and phenytoin.  PFSH  Drug: Ms. Berwanger  reports that she does not use drugs. Alcohol:  reports that she does not drink alcohol. Tobacco:  reports that she has been smoking cigarettes. She has been smoking about 3.00 packs per day. She has never used smokeless tobacco. Medical:  has a past medical history of Anxiety, Cervical dysplasia, COPD (chronic obstructive pulmonary disease) ([place]), Discharge from the vagina (11/19/2014), DVT (deep venous thrombosis) ([place]), Fibromyalgia, GERD (gastroesophageal reflux disease), H/O: obesity (04/06/2014), Hematuria, Herpes, genital, History of blood clotting disorder, Hypertension, Microscopic hematuria (11/23/2015), Mixed incontinence, MS (multiple sclerosis) ([place]), Multiple sclerosis ([place]) (06/03/2015), OAB (overactive bladder), Obesity, Pulmonary embolism ([place]), Snores, and  Stroke ([place]). Surgical: Ms. Schlereth  has a past surgical history that includes Abdominal hysterectomy; Bladder mesh; Chonca batosa; Dilation and curettage of uterus; Laser ablation condyloma cervical / vulvar; Cholecystectomy; Bladder suspension; Knee arthroscopy with medial menisectomy (Right, 08/23/2017); Synovectomy (Right, 08/23/2017); and Chondroplasty (Right, 08/23/2017). Family: family history includes Hypertension in her father and mother; Prostate cancer in her paternal uncle.  Constitutional Exam  General appearance: Well nourished, well developed, and well hydrated. In no apparent acute distress Vitals:   05/10/18 1145  BP: 132/90  Pulse: 73  Resp: 18  Temp: 98.4 F (36.9 C)  SpO2: 97%  Weight: 224 lb (101.6 kg)  Height: _0  (1.6 m)   BMI Assessment: Estimated body mass index is 39.68 kg/m as calculated from the following:   Height as of this encounter: _1  (1.6 m).  Weight as of this encounter: 224 lb (101.6 kg).  BMI interpretation table: BMI level Category Range association with higher incidence of chronic pain  <18 kg/m2 Underweight   18.5-24.9 kg/m2 Ideal body weight   25-29.9 kg/m2 Overweight Increased incidence by 20%  30-34.9 kg/m2 Obese (Class I) Increased incidence by 68%  35-39.9 kg/m2 Severe obesity (Class II) Increased incidence by 136%  >40 kg/m2 Extreme obesity (Class III) Increased incidence by 254%   Patient's current BMI Ideal Body weight  Body mass index is 39.68 kg/m. Ideal body weight: 52.4 kg (115 lb 8.3 oz) Adjusted ideal body weight: 72.1 kg (158 lb 14.6 oz)   BMI Readings from Last 4 Encounters:  05/10/18 39.68 kg/m  02/07/18 40.21 kg/m  02/01/18 40.21 kg/m  12/02/17 40.39 kg/m   Wt Readings from Last 4 Encounters:  05/10/18 224 lb (101.6 kg)  02/07/18 227 lb (103 kg)  02/01/18 227 lb (103 kg)  12/02/17 228 lb (103.4 kg)  Psych/Mental status: Alert, oriented x 3 (person, place, & time)       Eyes: PERLA Respiratory: No evidence of  acute respiratory distress  Cervical Spine Area Exam  Skin & Axial Inspection: No masses, redness, edema, swelling, or associated skin lesions Alignment: Symmetrical Functional ROM: Unrestricted ROM      Stability: No instability detected Muscle Tone/Strength: Functionally intact. No obvious neuro-muscular anomalies detected. Sensory (Neurological): Unimpaired Palpation: No palpable anomalies              Upper Extremity (UE) Exam    Side: Right upper extremity  Side: Left upper extremity  Skin & Extremity Inspection: Skin color, temperature, and hair growth are WNL. No peripheral edema or cyanosis. No masses, redness, swelling, asymmetry, or associated skin lesions. No contractures.  Skin & Extremity Inspection: Skin color, temperature, and hair growth are WNL. No peripheral edema or cyanosis. No masses, redness, swelling, asymmetry, or associated skin lesions. No contractures.  Functional ROM: Unrestricted ROM          Functional ROM: Unrestricted ROM          Muscle Tone/Strength: Functionally intact. No obvious neuro-muscular anomalies detected.  Muscle Tone/Strength: Functionally intact. No obvious neuro-muscular anomalies detected.  Sensory (Neurological): Unimpaired          Sensory (Neurological): Unimpaired          Palpation: No palpable anomalies              Palpation: No palpable anomalies              Provocative Test(s):  Phalen's test: deferred Tinel's test: deferred Apley's scratch test (touch opposite shoulder):  Action 1 (Across chest): deferred Action 2 (Overhead): deferred Action 3 (LB reach): deferred   Provocative Test(s):  Phalen's test: deferred Tinel's test: deferred Apley's scratch test (touch opposite shoulder):  Action 1 (Across chest): deferred Action 2 (Overhead): deferred Action 3 (LB reach): deferred    Thoracic Spine Area Exam  Skin & Axial Inspection: No masses, redness, or swelling Alignment: Symmetrical Functional ROM: Unrestricted  ROM Stability: No instability detected Muscle Tone/Strength: Functionally intact. No obvious neuro-muscular anomalies detected. Sensory (Neurological): Unimpaired Muscle strength & Tone: No palpable anomalies  Lumbar Spine Area Exam  Skin & Axial Inspection: No masses, redness, or swelling Alignment: Symmetrical Functional ROM: Unrestricted ROM       Stability: No instability detected Muscle Tone/Strength: Functionally intact. No obvious neuro-muscular anomalies detected. Sensory (Neurological): Unimpaired Palpation: No palpable anomalies       Provocative Tests: Hyperextension/rotation  test: deferred today       Lumbar quadrant test (Kemp's test): deferred today       Lateral bending test: deferred today       Patrick's Maneuver: deferred today                   FABER test: deferred today                   S-I anterior distraction/compression test: deferred today         S-I lateral compression test: deferred today         S-I Thigh-thrust test: deferred today         S-I Gaenslen's test: deferred today          Gait & Posture Assessment  Ambulation: Unassisted Gait: Relatively normal for age and body habitus Posture: WNL   Lower Extremity Exam    Side: Right lower extremity  Side: Left lower extremity  Stability: No instability observed          Stability: No instability observed          Skin & Extremity Inspection: Skin color, temperature, and hair growth are WNL. No peripheral edema or cyanosis. No masses, redness, swelling, asymmetry, or associated skin lesions. No contractures.  Skin & Extremity Inspection: Skin color, temperature, and hair growth are WNL. No peripheral edema or cyanosis. No masses, redness, swelling, asymmetry, or associated skin lesions. No contractures.  Functional ROM: Unrestricted ROM                  Functional ROM: Unrestricted ROM                  Muscle Tone/Strength: Functionally intact. No obvious neuro-muscular anomalies detected.  Muscle  Tone/Strength: Functionally intact. No obvious neuro-muscular anomalies detected.  Sensory (Neurological): Unimpaired  Sensory (Neurological): Unimpaired  Palpation: No palpable anomalies  Palpation: No palpable anomalies   Assessment  Primary Diagnosis & Pertinent Problem List: The primary encounter diagnosis was Multiple sclerosis ([place]). Diagnoses of Fibromyalgia, Mild chronic obstructive pulmonary disease ([place]), H/O: obesity, Other pulmonary embolism without acute cor pulmonale, unspecified chronicity (HCC) (on Xarelto), and Chronic pain syndrome were also pertinent to this visit.  Status Diagnosis  Controlled Controlled Controlled 1. Multiple sclerosis ([place])   2. Fibromyalgia   3. Mild chronic obstructive pulmonary disease ([place])   4. H/O: obesity   5. Other pulmonary embolism without acute cor pulmonale, unspecified chronicity (HCC) (on Xarelto)   6. Chronic pain syndrome      General Recommendations: The pain condition that the patient suffers from is best treated with a multidisciplinary approach that involves an increase in physical activity to prevent de-conditioning and worsening of the pain cycle, as well as psychological counseling (formal and/or informal) to address the co-morbid psychological affects of pain. Treatment will often involve judicious use of pain medications and interventional procedures to decrease the pain, allowing the patient to participate in the physical activity that will ultimately produce long-lasting pain reductions. The goal of the multidisciplinary approach is to return the patient to a higher level of overall function and to restore their ability to perform activities of daily living.  52 year old female with a history of multiple sclerosis, fibromyalgia (MS diagnosed in 56) who presents for chronic pain management. 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  Patient is currently anticoagulated with Xarelto given her increased propensity  for thrombo-embolism. She is unable to tolerate NSAIDs and they should be avoided. Patient has tried various pain medications in the past including tricyclic antidepressants, multiple pole muscle relaxants including baclofen, Flexeril, Robaxin, various opioid medications including tramadol which were not effective, Vicodin which was not effective, Percocet which was not effective, Demerol which was not effective, Tylenol with codeine which made patient dizzy. She does find benefit with as needed hydrocodone. She takes these medications very sparingly and usually 90 tablets will last her over 3 to 4 months. She was previously seen by a neurologist but is no longer under the care of a neurologist.  UDS reviewed. Rx provided below.  Plan of Care  Pharmacotherapy (Medications Ordered): Meds ordered this encounter  Medications  . HYDROcodone-acetaminophen (NORCO) 7.5-325 MG tablet    Sig: Take 1 tablet by mouth 2 (two) times daily as needed for moderate pain.    Dispense:  60 tablet    Refill:  0    Do not place this medication, or any other prescription from our practice, on "Automatic Refill". Patient may have prescription filled one day early if pharmacy is closed on scheduled refill date.   Time Note: Greater than 50% of the 25 minute(s) of face-to-face time spent with Ms. Tauer, was spent in counseling/coordination of care regarding: Ms. Dory primary cause of pain, the treatment plan, treatment alternatives, medication side effects, the opioid analgesic risks and possible complications, the appropriate use of her medications, realistic expectations, the goals of pain management (increased in functionality), the medication agreement and the patient's responsibilities when it comes to controlled substances.  Provider-requested follow-up: Return if symptoms worsen or fail to improve.  Future Appointments  Date  Time Provider [place]  07/05/2018 11:00 AM Versie Starks, Audie Clear, NP [place] None    Primary Care Physician: Lavera Guise, MD Location: [facility] Note by: Gillis Santa, M.D Date: 05/10/2018; Time: 3:03 PM  Patient Instructions  You have been given a script for Hydrocodone x 1 today.

## 2018-05-10 NOTE — Patient Instructions (Signed)
You have been given a script for Hydrocodone x 1 today.

## 2018-05-10 NOTE — Progress Notes (Signed)
Nursing Pain Medication Assessment:  Safety precautions to be maintained throughout the outpatient stay will include: orient to surroundings, keep bed in low position, maintain call bell within reach at all times, provide assistance with transfer out of bed and ambulation.   Medication Inspection Compliance: Pill count conducted under aseptic conditions, in front of the patient. Neither the pills nor the bottle was removed from the patient's sight at any time. Once count was completed pills were immediately returned to the patient in their original bottle.  Medication: Hydrocodone/APAP Pill/Patch Count: 31 of 60 pills remain Pill/Patch Appearance: Markings consistent with prescribed medication Bottle Appearance: Standard pharmacy container. Clearly labeled. Filled Date: 07 / 31 / 2019 Last Medication intake:  Ran out of medicine more than 48 hours ago

## 2018-07-05 ENCOUNTER — Ambulatory Visit: Payer: Self-pay | Admitting: Adult Health

## 2018-07-08 ENCOUNTER — Other Ambulatory Visit: Payer: Self-pay | Admitting: Internal Medicine

## 2018-07-08 DIAGNOSIS — F410 Panic disorder [episodic paroxysmal anxiety] without agoraphobia: Secondary | ICD-10-CM

## 2018-07-08 DIAGNOSIS — F411 Generalized anxiety disorder: Principal | ICD-10-CM

## 2018-08-19 ENCOUNTER — Emergency Department
Admission: EM | Admit: 2018-08-19 | Discharge: 2018-08-19 | Disposition: A | Discharge status: Home / Self Care | Payer: Medicare Other | Attending: Emergency Medicine | Admitting: Emergency Medicine

## 2018-08-19 ENCOUNTER — Encounter: Payer: Self-pay | Admitting: Emergency Medicine

## 2018-08-19 ENCOUNTER — Emergency Department: Payer: Medicare Other

## 2018-08-19 DIAGNOSIS — F17210 Nicotine dependence, cigarettes, uncomplicated: Secondary | ICD-10-CM | POA: Diagnosis not present

## 2018-08-19 DIAGNOSIS — M7989 Other specified soft tissue disorders: Secondary | ICD-10-CM | POA: Diagnosis not present

## 2018-08-19 DIAGNOSIS — Z79899 Other long term (current) drug therapy: Secondary | ICD-10-CM | POA: Diagnosis not present

## 2018-08-19 DIAGNOSIS — I10 Essential (primary) hypertension: Secondary | ICD-10-CM | POA: Insufficient documentation

## 2018-08-19 DIAGNOSIS — M79661 Pain in right lower leg: Secondary | ICD-10-CM | POA: Diagnosis not present

## 2018-08-19 DIAGNOSIS — J449 Chronic obstructive pulmonary disease, unspecified: Secondary | ICD-10-CM | POA: Diagnosis not present

## 2018-08-19 DIAGNOSIS — Z7901 Long term (current) use of anticoagulants: Secondary | ICD-10-CM | POA: Diagnosis not present

## 2018-08-19 DIAGNOSIS — M79604 Pain in right leg: Secondary | ICD-10-CM | POA: Diagnosis not present

## 2018-08-19 NOTE — ED Triage Notes (Signed)
Patient presents to the ED with leg pain and swelling.  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  Patient reports she takes blood thinners.

## 2018-08-19 NOTE — ED Provider Notes (Signed)
[facility] Emergency Department Provider Note  Time seen: 7:50 PM  I have reviewed the triage vital signs and the nursing notes.   HISTORY  Chief Complaint Leg Pain    HPI Tracey Chandler is a 53 y.o. 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  Patient is currently on Xarelto.  States for the past 2 to 3 days she has felt pains described as more of a throbbing pain in her entire right leg.  Patient denies any recent long car trips or plane trips.  Currently is taking her Xarelto as prescribed.  Denies any chest pain or shortness of breath.  Given her history she was concerned and wanted to be safe to make sure that there was no blood clot.   Past Medical History:  Diagnosis Date  . Anxiety   . Cervical dysplasia   . COPD (chronic obstructive pulmonary disease) (HCC)   . Discharge from the vagina 11/19/2014  . DVT (deep venous thrombosis) (HCC)   . Fibromyalgia   . GERD (gastroesophageal reflux disease)   . H/O: obesity 04/06/2014  . Hematuria   . Herpes, genital   . History of blood clotting disorder   . Hypertension    lost weight/no meds for 10 years  . Microscopic hematuria 11/23/2015  . Mixed incontinence   . MS (multiple sclerosis) (HCC)   . Multiple sclerosis (HCC) 06/03/2015  . OAB (overactive bladder)   . Obesity   . Pulmonary embolism (HCC)   . Snores   . Stroke [facility](HCC)     Patient Active Problem List   Diagnosis Date Noted  . Abdominal pain, acute, right lower quadrant 04/26/2018  . Osteoarthritis of knee 01/18/2017  . Urge urinary incontinence 11/23/2015  . Microscopic hematuria 11/23/2015  . Abnormal cells of cervix 06/03/2015  . Fibromyalgia 06/03/2015  . Multiple sclerosis (HCC) 06/03/2015  . Discharge from the vagina 11/19/2014  . Pulmonary embolism (HCC) 04/06/2014  .  Mild chronic obstructive pulmonary disease (HCC) 04/06/2014  . Snores 04/06/2014  . H/O: obesity 04/06/2014    Past Surgical History:  Procedure Laterality Date  . ABDOMINAL HYSTERECTOMY    . Bladder mesh    . BLADDER SUSPENSION    . CHOLECYSTECTOMY    . Chonca batosa    . CHONDROPLASTY Right 08/23/2017   Procedure: CHONDROPLASTY;  Surgeon: Lyndle HerrlichBowers, James R, MD;  Location: [facility] ORS;  Service: Orthopedics;  Laterality: Right;  . DILATION AND CURETTAGE OF UTERUS    . KNEE ARTHROSCOPY WITH MEDIAL MENISECTOMY Right 08/23/2017   Procedure: KNEE ARTHROSCOPY WITH MEDIAL MENISECTOMY;  Surgeon: Lyndle HerrlichBowers, James R, MD;  Location: [facility] ORS;  Service: Orthopedics;  Laterality: Right;  . LASER ABLATION CONDYLOMA CERVICAL / VULVAR    . SYNOVECTOMY Right 08/23/2017   Procedure: SYNOVECTOMY;  Surgeon: Lyndle HerrlichBowers, James R, MD;  Location: [facility] ORS;  Service: Orthopedics;  Laterality: Right;    Prior to Admission medications   Medication Sig Start Date End Date Taking? Authorizing Provider  albuterol (PROAIR HFA) 108 (90 BASE) MCG/ACT inhaler Inhale 2 puffs into the lungs every 6 (six) hours as needed for wheezing or shortness of breath.  07/16/14 08/23/17  [provider]  augmented betamethasone dipropionate (DIPROLENE-AF) 0.05 % ointment Apply topically 2 (two) times daily.    [provider]  busPIRone (BUSPAR) 5 MG tablet  TAKE 1 TABLET BY MOUTH 3  TIMES DAILY AS NEEDED 07/08/18   Lyndon CodeKhan, Fozia M, MD  Calcium Carbonate-Vitamin D (CALCIUM-VITAMIN D3 PO) Take 1 tablet by mouth daily.    [provider]  Cholecalciferol (VITAMIN D3) 2000 UNITS capsule Take 2,000 Units by mouth daily.     [provider]  clobetasol (TEMOVATE) 0.05 % external solution Apply 1 application topically 2 (two) times daily.    [provider]  fluticasone (FLONASE) 50 MCG/ACT nasal spray Place 1 spray into both nostrils daily as needed for allergies.     [provider]   HYDROcodone-acetaminophen (NORCO) 7.5-325 MG tablet Take 1 tablet by mouth 2 (two) times daily as needed for moderate pain. 05/10/18   Edward JollyLateef, Bilal, MD  LYRICA 150 MG capsule Take 1 capsule (150 mg total) by mouth 2 (two) times daily. 04/22/18   Lyndon CodeKhan, Fozia M, MD  meclizine (ANTIVERT) 25 MG tablet TAKE 1 TABLET BY MOUTH 3  TIMES A DAY AS NEEDED FOR  DIZZINESS 07/08/18   Lyndon CodeKhan, Fozia M, MD  mirabegron ER (MYRBETRIQ) 25 MG TB24 tablet Take 1 tablet (25 mg total) by mouth daily. 09/21/17   Michiel CowboyMcGowan, Shannon A, PA-C  oxybutynin (DITROPAN XL) 15 MG 24 hr tablet Take 1 tablet (15 mg total) by mouth daily. 05/03/18   Michiel CowboyMcGowan, Shannon A, PA-C  pantoprazole (PROTONIX) 40 MG tablet Take 1 tablet (40 mg total) by mouth daily. 06/30/17   Carlean JewsBoscia, Heather E, NP  sertraline (ZOLOFT) 100 MG tablet Take 1.5 tablets (150 mg total) by mouth daily. 09/13/17   Carlean JewsBoscia, Heather E, NP  valACYclovir (VALTREX) 500 MG tablet Take 1 tablet (500 mg total) by mouth daily. Patient taking differently: Take 1,000 mg by mouth daily as needed (for out breaks).  06/30/17   Boscia, Kathlynn GrateHeather E, NP  XARELTO 20 MG TABS tablet Take 1 tablet (20 mg total) by mouth daily. 06/30/17   Carlean JewsBoscia, Heather E, NP    Allergies  Allergen Reactions  . Cefaclor Other (See Comments)     swells face and turns red  . Penicillins Hives and Other (See Comments)    Has patient had a PCN reaction causing immediate rash, facial/tongue/throat swelling, SOB or lightheadedness with hypotension: Unknown Has patient had a PCN reaction causing severe rash involving mucus membranes or skin necrosis: No Has patient had a PCN reaction that required hospitalization: Unknown Has patient had a PCN reaction occurring within the last 10 years: No If all of the above answers are "NO", then may proceed with Cephalosporin use.   Marland Kitchen. Phenytoin Hives    Other reaction(s): Unknown    Family History  Problem Relation Age of Onset  . Hypertension Mother   . Hypertension Father    . Prostate cancer Paternal Uncle   . Kidney cancer Neg Hx   . Bladder Cancer Neg Hx     Social History Social History   Tobacco Use  . Smoking status: Current Some Day Smoker    Packs/day: 3.00    Types: Cigarettes    Last attempt to quit: 08/15/2017    Years since quitting: 1.0  . Smokeless tobacco: Never Used  Substance Use Topics  . Alcohol use: No    Alcohol/week: 0.0 standard drinks  . Drug use: No    Review of Systems Constitutional: Negative for fever. Cardiovascular: Negative for chest pain. Respiratory: Negative for shortness of breath. Gastrointestinal: Negative for abdominal pain Musculoskeletal: Throbbing in the right leg, intermittent and mild. Skin: Negative for  skin complaints  Neurological: Negative for headache All other ROS negative  ____________________________________________   PHYSICAL EXAM:  VITAL SIGNS: ED Triage Vitals  Enc Vitals Group     BP 08/19/18 1831 135/75     Pulse Rate 08/19/18 1831 74     Resp 08/19/18 1831 18     Temp 08/19/18 1831 98 F (36.7 C)     Temp Source 08/19/18 1831 Oral     SpO2 08/19/18 1831 96 %     Weight 08/19/18 1823 225 lb (102.1 kg)     Height 08/19/18 1823 5\' 3"  (1.6 m)     Head Circumference --      Peak Flow --      Pain Score 08/19/18 1822 9     Pain Loc --      Pain Edu? --      Excl. in GC? --    Constitutional: Alert and oriented. Well appearing and in no distress. Eyes: Normal exam ENT   Head: Normocephalic and atraumatic.   Mouth/Throat: Mucous membranes are moist. Cardiovascular: Normal rate, regular rhythm.  Respiratory: Normal respiratory effort without tachypnea nor retractions. Breath sounds are clear Gastrointestinal: Soft and nontender. No distention.   Musculoskeletal: Patient's leg is largely nontender, no edema, 2+ DP pulse, sensation intact and normal.  Leg is warm, no skin color changes to the foot.  No calf tenderness.  No popliteal tenderness. Neurologic:  Normal speech  and language. No gross focal neurologic deficits  Skin:  Skin is warm, dry and intact.  Psychiatric: Mood and affect are normal.   ____________________________________________   RADIOLOGY  Sound shows what appears to be a Baker's cyst, otherwise negative.  No DVT.  ____________________________________________   INITIAL IMPRESSION / ASSESSMENT AND PLAN / ED COURSE  Pertinent labs & imaging results that were available during my care of the patient were reviewed by me and considered in my medical decision making (see chart for details).  Patient presents to the emergency department for intermittent throbbing of the right lower extremity x2 to 3 days, history of PE/DVT 4 years ago currently on Xarelto.  Differential would include musculoskeletal pain, DVT, Baker's cyst.  We will obtain an ultrasound and continue to closely monitor.  Reassuringly patient has no chest pain or shortness of breath and reassuring vitals.  Ultrasound shows what appears to be Baker's cyst, possibly causing some of her discomfort although largely unchanged from prior ultrasound.  No findings concerning for DVT.  Patient will be discharged with PCP follow-up.  ____________________________________________   FINAL CLINICAL IMPRESSION(S) / ED DIAGNOSES  Right leg pain   Minna Antis, MD 08/19/18 2005

## 2018-08-20 ENCOUNTER — Other Ambulatory Visit: Payer: Self-pay | Admitting: Urology

## 2018-08-20 DIAGNOSIS — N3946 Mixed incontinence: Secondary | ICD-10-CM

## 2018-08-22 ENCOUNTER — Telehealth: Payer: Self-pay | Admitting: Urology

## 2018-08-22 NOTE — Telephone Encounter (Signed)
Mrs. Duffee or her pharmacy are requesting a refill for the Myrbetriq.  I have authorized a 90 day supply.  If she needs more refills she will need to make an office visit sometime in March for her yearly visit.

## 2018-08-22 NOTE — Telephone Encounter (Signed)
Patient notified and voiced understanding. She was unable to schedule an appointment on the phone today as she was driving.

## 2018-08-23 ENCOUNTER — Ambulatory Visit: Payer: Self-pay | Admitting: Nurse Practitioner

## 2018-09-02 ENCOUNTER — Other Ambulatory Visit: Payer: Self-pay

## 2018-09-02 DIAGNOSIS — K219 Gastro-esophageal reflux disease without esophagitis: Secondary | ICD-10-CM

## 2018-09-02 MED ORDER — PANTOPRAZOLE SODIUM 40 MG PO TBEC: 40.0000 mg | DELAYED_RELEASE_TABLET | Freq: Every day | ORAL | 0 refills | Status: DC

## 2018-09-02 NOTE — Telephone Encounter (Signed)
SPOKE WITH PT HAD APPT COMING UP IN 09/05/18 I SEND 30 DAYS FOR PROTONIX OTHER MED WE CAN FILL ON NEXT VISIT

## 2018-09-05 ENCOUNTER — Ambulatory Visit (INDEPENDENT_AMBULATORY_CARE_PROVIDER_SITE_OTHER): Payer: Medicare Other | Admitting: Nurse Practitioner

## 2018-09-05 ENCOUNTER — Encounter: Payer: Self-pay | Admitting: Nurse Practitioner

## 2018-09-05 VITALS — BP 129/78 | HR 80 | Temp 98.4°F | Resp 16 | Ht 63.0 in | Wt 222.6 lb

## 2018-09-05 DIAGNOSIS — F339 Major depressive disorder, recurrent, unspecified: Secondary | ICD-10-CM

## 2018-09-05 DIAGNOSIS — R509 Fever, unspecified: Secondary | ICD-10-CM | POA: Insufficient documentation

## 2018-09-05 DIAGNOSIS — K582 Mixed irritable bowel syndrome: Secondary | ICD-10-CM

## 2018-09-05 DIAGNOSIS — F411 Generalized anxiety disorder: Secondary | ICD-10-CM | POA: Diagnosis not present

## 2018-09-05 DIAGNOSIS — M797 Fibromyalgia: Secondary | ICD-10-CM

## 2018-09-05 DIAGNOSIS — N3946 Mixed incontinence: Secondary | ICD-10-CM

## 2018-09-05 DIAGNOSIS — N3941 Urge incontinence: Secondary | ICD-10-CM

## 2018-09-05 DIAGNOSIS — J0110 Acute frontal sinusitis, unspecified: Secondary | ICD-10-CM | POA: Diagnosis not present

## 2018-09-05 DIAGNOSIS — Z1239 Encounter for other screening for malignant neoplasm of breast: Secondary | ICD-10-CM

## 2018-09-05 DIAGNOSIS — F410 Panic disorder [episodic paroxysmal anxiety] without agoraphobia: Secondary | ICD-10-CM

## 2018-09-05 DIAGNOSIS — I2699 Other pulmonary embolism without acute cor pulmonale: Secondary | ICD-10-CM

## 2018-09-05 DIAGNOSIS — K219 Gastro-esophageal reflux disease without esophagitis: Secondary | ICD-10-CM

## 2018-09-05 LAB — POC INFLUENZA TEST
Negative: NEGATIVE
Positive: NEGATIVE

## 2018-09-05 MED ORDER — XARELTO 20 MG PO TABS: 20.0000 mg | ORAL_TABLET | Freq: Every day | ORAL | 4 refills | Status: DC

## 2018-09-05 MED ORDER — AZITHROMYCIN 250 MG PO TABS: ORAL_TABLET | ORAL | 0 refills | Status: DC

## 2018-09-05 MED ORDER — LYRICA 150 MG PO CAPS: 150.0000 mg | ORAL_CAPSULE | Freq: Two times a day (BID) | ORAL | 1 refills | Status: DC

## 2018-09-05 MED ORDER — PANTOPRAZOLE SODIUM 40 MG PO TBEC: 40.0000 mg | DELAYED_RELEASE_TABLET | Freq: Every day | ORAL | 3 refills | Status: DC

## 2018-09-05 MED ORDER — SERTRALINE HCL 100 MG PO TABS: 150.0000 mg | ORAL_TABLET | Freq: Every day | ORAL | 0 refills | Status: DC

## 2018-09-05 MED ORDER — BUSPIRONE HCL 5 MG PO TABS: 5.0000 mg | ORAL_TABLET | Freq: Three times a day (TID) | ORAL | 3 refills | Status: AC | PRN

## 2018-09-05 MED ORDER — OXYBUTYNIN CHLORIDE ER 15 MG PO TB24: 15.0000 mg | ORAL_TABLET | Freq: Every day | ORAL | 3 refills | Status: DC

## 2018-09-05 MED ORDER — MIRABEGRON ER 25 MG PO TB24: 25.0000 mg | ORAL_TABLET | Freq: Every day | ORAL | 3 refills | Status: DC

## 2018-09-05 NOTE — Progress Notes (Signed)
[facility] [address] [place], [place] [address]  Internal MEDICINE  Office Visit Note  Patient Name: Tracey Chandler  [id number]  [id number]  Date of Service: 09/05/2018  Chief Complaint  Patient presents with  . Medical Management of Chronic Issues    medication refills  . Fever    pt has been havins symptoms of sinus infection with some chills,   . Headache  . Nasal Congestion    The patient is here for routine follow up. Today, she is c/o headache, fever, congestion, and cough. This has been going on for few weeks. She was unable to come in earlier, as her mother had been ill and hospitalized with brain tumor, which appears benign. The patient's husband is also ill with COPD with reduced lung capacity. She states she has been taking care of everyone else and has put her own issues on the back burner.       Current Medication: Outpatient Encounter Medications as of 09/05/2018  Medication Sig  . augmented betamethasone dipropionate (DIPROLENE-AF) 0.05 % ointment Apply topically 2 (two) times daily.  . busPIRone (BUSPAR) 5 MG tablet Take 1 tablet (5 mg total) by mouth 3 (three) times daily as needed.  . Calcium Carbonate-Vitamin D (CALCIUM-VITAMIN D3 PO) Take 1 tablet by mouth daily.  . Cholecalciferol (VITAMIN D3) 2000 UNITS capsule Take 2,000 Units by mouth daily.   . clobetasol (TEMOVATE) 0.05 % external solution Apply 1 application topically 2 (two) times daily.  . fluticasone (FLONASE) 50 MCG/ACT nasal spray Place 1 spray into both nostrils daily as needed for allergies.   Marland Kitchen HYDROcodone-acetaminophen (NORCO) 7.5-325 MG tablet Take 1 tablet by mouth 2 (two) times daily as needed for moderate pain.  Marland Kitchen LYRICA 150 MG capsule Take 1 capsule (150 mg total) by mouth 2 (two) times daily.  . meclizine (ANTIVERT) 25 MG tablet TAKE 1 TABLET BY MOUTH 3  TIMES A DAY AS NEEDED FOR  DIZZINESS  . mirabegron ER (MYRBETRIQ) 25 MG TB24 tablet Take 1 tablet (25 mg total) by mouth  daily.  Marland Kitchen oxybutynin (DITROPAN XL) 15 MG 24 hr tablet Take 1 tablet (15 mg total) by mouth daily.  . pantoprazole (PROTONIX) 40 MG tablet Take 1 tablet (40 mg total) by mouth daily.  . sertraline (ZOLOFT) 100 MG tablet Take 1.5 tablets (150 mg total) by mouth daily.  . valACYclovir (VALTREX) 500 MG tablet Take 1 tablet (500 mg total) by mouth daily. (Patient taking differently: Take 1,000 mg by mouth daily as needed (for out breaks). )  . XARELTO 20 MG TABS tablet Take 1 tablet (20 mg total) by mouth daily.  . [DISCONTINUED] busPIRone (BUSPAR) 5 MG tablet TAKE 1 TABLET BY MOUTH 3  TIMES DAILY AS NEEDED  . [DISCONTINUED] LYRICA 150 MG capsule Take 1 capsule (150 mg total) by mouth 2 (two) times daily.  . [DISCONTINUED] LYRICA 150 MG capsule Take 1 capsule (150 mg total) by mouth 2 (two) times daily.  . [DISCONTINUED] MYRBETRIQ 25 MG TB24 tablet TAKE 1 TABLET BY MOUTH  DAILY  . [DISCONTINUED] oxybutynin (DITROPAN XL) 15 MG 24 hr tablet Take 1 tablet (15 mg total) by mouth daily.  . [DISCONTINUED] pantoprazole (PROTONIX) 40 MG tablet Take 1 tablet (40 mg total) by mouth daily.  . [DISCONTINUED] sertraline (ZOLOFT) 100 MG tablet Take 1.5 tablets (150 mg total) by mouth daily.  . [DISCONTINUED] XARELTO 20 MG TABS tablet Take 1 tablet (20 mg total) by mouth daily.  Marland Kitchen albuterol (PROAIR HFA)  108 (90 BASE) MCG/ACT inhaler Inhale 2 puffs into the lungs every 6 (six) hours as needed for wheezing or shortness of breath.   Marland Kitchen azithromycin (ZITHROMAX) 250 MG tablet z-pack - take as directed for 5 days   No facility-administered encounter medications on file as of 09/05/2018.     Surgical History: Past Surgical History:  Procedure Laterality Date  . ABDOMINAL HYSTERECTOMY    . Bladder mesh    . BLADDER SUSPENSION    . CHOLECYSTECTOMY    . Chonca batosa    . CHONDROPLASTY Right 08/23/2017   Procedure: CHONDROPLASTY;  Surgeon: Lyndle Herrlich, MD;  Location: [facility] ORS;  Service: Orthopedics;  Laterality:  Right;  . DILATION AND CURETTAGE OF UTERUS    . KNEE ARTHROSCOPY WITH MEDIAL MENISECTOMY Right 08/23/2017   Procedure: KNEE ARTHROSCOPY WITH MEDIAL MENISECTOMY;  Surgeon: Lyndle Herrlich, MD;  Location: [facility] ORS;  Service: Orthopedics;  Laterality: Right;  . LASER ABLATION CONDYLOMA CERVICAL / VULVAR    . SYNOVECTOMY Right 08/23/2017   Procedure: SYNOVECTOMY;  Surgeon: Lyndle Herrlich, MD;  Location: [facility] ORS;  Service: Orthopedics;  Laterality: Right;    Medical History: Past Medical History:  Diagnosis Date  . Anxiety   . Cervical dysplasia   . COPD (chronic obstructive pulmonary disease) (HCC)   . Discharge from the vagina 11/19/2014  . DVT (deep venous thrombosis) (HCC)   . Fibromyalgia   . GERD (gastroesophageal reflux disease)   . H/O: obesity 04/06/2014  . Hematuria   . Herpes, genital   . History of blood clotting disorder   . Hypertension    lost weight/no meds for 10 years  . Microscopic hematuria 11/23/2015  . Mixed incontinence   . MS (multiple sclerosis) (HCC)   . Multiple sclerosis (HCC) 06/03/2015  . OAB (overactive bladder)   . Obesity   . Pulmonary embolism (HCC)   . Snores   . Stroke [facility])     Family History: Family History  Problem Relation Age of Onset  . Hypertension Mother   . Hypertension Father   . Prostate cancer Paternal Uncle   . Kidney cancer Neg Hx   . Bladder Cancer Neg Hx     Social History   Socioeconomic History  . Marital status: Married    Spouse name: Not on file  . Number of children: Not on file  . Years of education: Not on file  . Highest education level: Not on file  Occupational History  . Not on file  Social Needs  . Financial resource strain: Not on file  . Food insecurity:    Worry: Not on file    Inability: Not on file  . Transportation needs:    Medical: Not on file    Non-medical: Not on file  Tobacco Use  . Smoking status: Current Some Day Smoker    Packs/day: 3.00    Types: Cigarettes    Last attempt to quit:  08/15/2017    Years since quitting: 1.0  . Smokeless tobacco: Never Used  Substance and Sexual Activity  . Alcohol use: No    Alcohol/week: 0.0 standard drinks  . Drug use: No  . Sexual activity: Not on file  Lifestyle  . Physical activity:    Days per week: Not on file    Minutes per session: Not on file  . Stress: Not on file  Relationships  . Social connections:    Talks on phone: Not on file    Gets together: Not  on file    Attends religious service: Not on file    Active member of club or organization: Not on file    Attends meetings of clubs or organizations: Not on file    Relationship status: Not on file  . Intimate partner violence:    Fear of current or ex partner: Not on file    Emotionally abused: Not on file    Physically abused: Not on file    Forced sexual activity: Not on file  Other Topics Concern  . Not on file  Social History Narrative  . Not on file      Review of Systems  Constitutional: Positive for chills and fever. Negative for diaphoresis and fatigue.  HENT: Positive for congestion and postnasal drip. Negative for ear pain and sinus pressure.   Eyes: Negative for photophobia, discharge, redness, itching and visual disturbance.  Respiratory: Positive for cough and wheezing. Negative for shortness of breath.   Cardiovascular: Negative for chest pain, palpitations and leg swelling.  Gastrointestinal: Negative for abdominal pain, constipation, diarrhea, nausea and vomiting.  Endocrine: Negative for cold intolerance, polydipsia and polyuria.  Genitourinary: Negative for dysuria and flank pain.  Musculoskeletal: Negative for arthralgias, back pain, gait problem and neck pain.  Skin: Negative for color change.  Allergic/Immunologic: Positive for environmental allergies. Negative for food allergies.  Neurological: Positive for headaches. Negative for dizziness.  Hematological: Positive for adenopathy. Does not bruise/bleed easily.   Psychiatric/Behavioral: Negative for agitation, behavioral problems (depression) and hallucinations. The patient is nervous/anxious.     Today's Vitals   09/05/18 1401  BP: 129/78  Pulse: 80  Resp: 16  Temp: 98.4 F (36.9 C)  SpO2: 97%  Weight: 222 lb 9.6 oz (101 kg)  Height: 5\' 3"  (1.6 m)   Body mass index is 39.43 kg/m.  Physical Exam Vitals signs and nursing note reviewed.  Constitutional:      General: She is not in acute distress.    Appearance: She is well-developed. She is ill-appearing. She is not diaphoretic.  HENT:     Head: Normocephalic and atraumatic.     Right Ear: Tympanic membrane is erythematous and bulging.     Left Ear: Tympanic membrane is erythematous and bulging.     Nose: Congestion present.     Right Sinus: Frontal sinus tenderness present.     Left Sinus: Frontal sinus tenderness present.     Mouth/Throat:     Pharynx: No oropharyngeal exudate.  Neck:     Musculoskeletal: Neck supple.     Thyroid: No thyromegaly.     Vascular: No JVD.     Trachea: No tracheal deviation.  Cardiovascular:     Rate and Rhythm: Normal rate and regular rhythm.     Heart sounds: Normal heart sounds. No murmur. No friction rub. No gallop.   Pulmonary:     Effort: Pulmonary effort is normal. No respiratory distress.     Breath sounds: Normal breath sounds. No wheezing or rales.  Chest:     Chest wall: No tenderness.  Abdominal:     General: Bowel sounds are normal.     Palpations: Abdomen is soft.  Lymphadenopathy:     Cervical: No cervical adenopathy.  Skin:    General: Skin is warm and dry.  Neurological:     Mental Status: She is alert and oriented to person, place, and time.     Cranial Nerves: No cranial nerve deficit.  Psychiatric:        Behavior: Behavior  normal.        Thought Content: Thought content normal.        Judgment: Judgment normal.   Assessment/Plan: 1. Fever and chills - POC INFLUENZA TEST negative today. Treat as acute sinusitis.    2. Acute non-recurrent frontal sinusitis Start x-pack. Take as directed for 5 days. Recommend use of OTC medication to improve symptoms.  - azithromycin (ZITHROMAX) 250 MG tablet; z-pack - take as directed for 5 days  Dispense: 6 tablet; Refill: 0  3. Fibromyalgia Renew Lyrica 150mg  twice daily. Will fill out prior authorization paperwork to override denial from insurance.  - LYRICA 150 MG capsule; Take 1 capsule (150 mg total) by mouth 2 (two) times daily.  Dispense: 180 capsule; Refill: 1  4. Generalized anxiety disorder with panic attacks Continue buspar 5mg  twice daily as needed fr acute anxiety. - busPIRone (BUSPAR) 5 MG tablet; Take 1 tablet (5 mg total) by mouth 3 (three) times daily as needed.  Dispense: 270 tablet; Refill: 3  5. Mixed incontinence Continue Myrbetriq 25mg  daily. - mirabegron ER (MYRBETRIQ) 25 MG TB24 tablet; Take 1 tablet (25 mg total) by mouth daily.  Dispense: 90 tablet; Refill: 3  6. Urge urinary incontinence Continue ditropan XL 15mg  daily - oxybutynin (DITROPAN XL) 15 MG 24 hr tablet; Take 1 tablet (15 mg total) by mouth daily.  Dispense: 90 tablet; Refill: 3  7. Gastroesophageal reflux disease without esophagitis - pantoprazole (PROTONIX) 40 MG tablet; Take 1 tablet (40 mg total) by mouth daily.  Dispense: 90 tablet; Refill: 3  8. Depression, recurrent (HCC) Stable.  - sertraline (ZOLOFT) 100 MG tablet; Take 1.5 tablets (150 mg total) by mouth daily.  Dispense: 135 tablet; Refill: 0  9. Other pulmonary embolism without acute cor pulmonale, unspecified chronicity (HCC) - XARELTO 20 MG TABS tablet; Take 1 tablet (20 mg total) by mouth daily.  Dispense: 90 tablet; Refill: 4  10. Screening for breast cancer - MM DIGITAL SCREENING BILATERAL; Future  11. Irritable bowel syndrome with both constipation and diarrhea Refer to GI for screening colonoscopy. - Ambulatory referral to Gastroenterology  General Counseling: hazeleigh trammell understanding of  the findings of todays visit and agrees with plan of treatment. I have discussed any further diagnostic evaluation that may be needed or ordered today. We also reviewed her medications today. she has been encouraged to call the office with any questions or concerns that should arise related to todays visit.  This patient was seen by Vincent Gros FNP Collaboration with Dr Lyndon Code as a part of collaborative care agreement  Orders Placed This Encounter  Procedures  . MM DIGITAL SCREENING BILATERAL  . Ambulatory referral to Gastroenterology  . POC INFLUENZA TEST    Meds ordered this encounter  Medications  . DISCONTD: LYRICA 150 MG capsule    Sig: Take 1 capsule (150 mg total) by mouth 2 (two) times daily.    Dispense:  180 capsule    Refill:  1    Insurance is no longer covering. Can you please send [place] prior authorization paperwork, as she has been on this for years and to change this would potentially cause negative health outcomes.    Order Specific Question:   Supervising Provider    Answer:   Lyndon Code [[id number]]  . busPIRone (BUSPAR) 5 MG tablet    Sig: Take 1 tablet (5 mg total) by mouth 3 (three) times daily as needed.    Dispense:  270 tablet    Refill:  3    Order Specific Question:   Supervising Provider    Answer:   Lyndon CodeKHAN, FOZIA M [[id number]]  . mirabegron ER (MYRBETRIQ) 25 MG TB24 tablet    Sig: Take 1 tablet (25 mg total) by mouth daily.    Dispense:  90 tablet    Refill:  3    Order Specific Question:   Supervising Provider    Answer:   Lyndon CodeKHAN, FOZIA M [[id number]]  . oxybutynin (DITROPAN XL) 15 MG 24 hr tablet    Sig: Take 1 tablet (15 mg total) by mouth daily.    Dispense:  90 tablet    Refill:  3    Order Specific Question:   Supervising Provider    Answer:   Lyndon CodeKHAN, FOZIA M [[id number]]  . pantoprazole (PROTONIX) 40 MG tablet    Sig: Take 1 tablet (40 mg total) by mouth daily.    Dispense:  90 tablet    Refill:  3    Order Specific Question:   Supervising Provider     Answer:   Lyndon CodeKHAN, FOZIA M [[id number]]  . sertraline (ZOLOFT) 100 MG tablet    Sig: Take 1.5 tablets (150 mg total) by mouth daily.    Dispense:  135 tablet    Refill:  0    Order Specific Question:   Supervising Provider    Answer:   Lyndon CodeKHAN, FOZIA M [[id number]]  . XARELTO 20 MG TABS tablet    Sig: Take 1 tablet (20 mg total) by mouth daily.    Dispense:  90 tablet    Refill:  4    Order Specific Question:   Supervising Provider    Answer:   Lyndon CodeKHAN, FOZIA M [[id number]]  . LYRICA 150 MG capsule    Sig: Take 1 capsule (150 mg total) by mouth 2 (two) times daily.    Dispense:  180 capsule    Refill:  1    Insurance is no longer covering. Can you please send us prior authorization paperwork, as she has been on this for years and to change this would potentially cause negative health outcomes.    Order Specific Question:   Supervising Provider    Answer:   Lyndon CodeKHAN, FOZIA M [[id number]]  . azithromycin (ZITHROMAX) 250 MG tablet    Sig: z-pack - take as directed for 5 days    Dispense:  6 tablet    Refill:  0    Order Specific Question:   Supervising Provider    Answer:   Lyndon CodeKHAN, FOZIA M [[id number]]    Time spent: 730 Minutes      Dr Lyndon CodeFozia M Khan Internal medicine

## 2018-09-16 ENCOUNTER — Telehealth: Payer: Self-pay

## 2018-09-16 ENCOUNTER — Other Ambulatory Visit: Payer: Self-pay | Admitting: Nurse Practitioner

## 2018-09-16 DIAGNOSIS — J0110 Acute frontal sinusitis, unspecified: Secondary | ICD-10-CM

## 2018-09-16 MED ORDER — AZITHROMYCIN 250 MG PO TABS: ORAL_TABLET | ORAL | 0 refills | Status: DC

## 2018-09-16 NOTE — Progress Notes (Signed)
After finishing round antibiotics started 09/05/2018, feeling mostly better, but still congested and not completely resolved. Renewed z-pack. Take as directed for 5 days. Sent to [facility]. She should take OTC medication as needed for persistent symptoms.

## 2018-09-16 NOTE — Telephone Encounter (Signed)
After finishing round antibiotics started 09/05/2018, feeling mostly better, but still congested and not completely resolved. Renewed z-pack. Take as directed for 5 days. Sent to [facility]. She should take OTC medication as needed for persistent symptoms.

## 2018-09-19 ENCOUNTER — Emergency Department
Admission: EM | Admit: 2018-09-19 | Discharge: 2018-09-19 | Disposition: A | Discharge status: Home / Self Care | Payer: Medicare Other | Attending: Emergency Medicine | Admitting: Emergency Medicine

## 2018-09-19 ENCOUNTER — Other Ambulatory Visit: Payer: Self-pay

## 2018-09-19 ENCOUNTER — Encounter: Payer: Self-pay | Admitting: Emergency Medicine

## 2018-09-19 ENCOUNTER — Encounter: Payer: Self-pay | Admitting: *Deleted

## 2018-09-19 DIAGNOSIS — J101 Influenza due to other identified influenza virus with other respiratory manifestations: Secondary | ICD-10-CM | POA: Insufficient documentation

## 2018-09-19 DIAGNOSIS — F17210 Nicotine dependence, cigarettes, uncomplicated: Secondary | ICD-10-CM | POA: Insufficient documentation

## 2018-09-19 DIAGNOSIS — I10 Essential (primary) hypertension: Secondary | ICD-10-CM | POA: Insufficient documentation

## 2018-09-19 DIAGNOSIS — Z79899 Other long term (current) drug therapy: Secondary | ICD-10-CM | POA: Diagnosis not present

## 2018-09-19 DIAGNOSIS — J449 Chronic obstructive pulmonary disease, unspecified: Secondary | ICD-10-CM | POA: Diagnosis not present

## 2018-09-19 DIAGNOSIS — R509 Fever, unspecified: Secondary | ICD-10-CM | POA: Diagnosis present

## 2018-09-19 LAB — INFLUENZA PANEL BY PCR (TYPE A & B)
Influenza A By PCR: NEGATIVE
Influenza B By PCR: POSITIVE — AB

## 2018-09-19 MED ORDER — OSELTAMIVIR PHOSPHATE 75 MG PO CAPS: 75.0000 mg | ORAL_CAPSULE | Freq: Two times a day (BID) | ORAL | 0 refills | Status: DC

## 2018-09-19 MED ORDER — MAGIC MOUTHWASH W/LIDOCAINE: 5.0000 mL | Freq: Four times a day (QID) | ORAL | 0 refills | Status: AC

## 2018-09-19 NOTE — ED Triage Notes (Signed)
Patient ambulatory to triage with steady gait, without difficulty or distress noted, mask in place; pt began zpak for sinus infection 2/28; since yesterday having fever accomp by sinus congestion with prod cough green sputum

## 2018-09-19 NOTE — ED Provider Notes (Signed)
[facility] Emergency Department Provider Note  ____________________________________________  Time seen: Approximately 10:14 PM  I have reviewed the triage vital signs and the nursing notes.   HISTORY  Chief Complaint Fever    HPI Tracey Chandler is a 53 y.o. female who presents emergency department complaining of sudden onset of fever, chills, body aches, cough.  Patient reports that she was being treated for sinus infection with azithromycin.  Patient has 1 more dose of azithromycin and suddenly started feeling worse tonight.  Patient is unsure whether her sinus infection is worsening or whether she has another condition.  Patient denies any headache, visual changes, neck pain or stiffness, chest pain, abdominal pain, nausea vomiting, diarrhea or constipation.  Patient has a significant medical history to include COPD, DVT, GERD, multiple sclerosis, pulmonary embolism, CVA.  Patient denies any complaints with chronic medical problems at this time.  Other than azithromycin she is not taking any medications for this complaint.    Past Medical History:  Diagnosis Date  . Anxiety   . Cervical dysplasia   . COPD (chronic obstructive pulmonary disease) (HCC)   . Discharge from the vagina 11/19/2014  . DVT (deep venous thrombosis) (HCC)   . Fibromyalgia   . GERD (gastroesophageal reflux disease)   . H/O: obesity 04/06/2014  . Hematuria   . Herpes, genital   . History of blood clotting disorder   . Hypertension    lost weight/no meds for 10 years  . Microscopic hematuria 11/23/2015  . Mixed incontinence   . MS (multiple sclerosis) (HCC)   . Multiple sclerosis (HCC) 06/03/2015  . OAB (overactive bladder)   . Obesity   . Pulmonary embolism (HCC)   . Snores   . Stroke [facility])     Patient Active Problem List   Diagnosis Date Noted  . Fever and chills 09/05/2018  . Acute non-recurrent frontal sinusitis 09/05/2018  . Generalized anxiety disorder with panic attacks  09/05/2018  . Gastroesophageal reflux disease without esophagitis 09/05/2018  . Depression, recurrent (HCC) 09/05/2018  . Screening for breast cancer 09/05/2018  . Irritable bowel syndrome with both constipation and diarrhea 09/05/2018  . Abdominal pain, acute, right lower quadrant 04/26/2018  . Osteoarthritis of knee 01/18/2017  . Mixed incontinence 11/23/2015  . Microscopic hematuria 11/23/2015  . Abnormal cells of cervix 06/03/2015  . Fibromyalgia 06/03/2015  . Multiple sclerosis (HCC) 06/03/2015  . Discharge from the vagina 11/19/2014  . Pulmonary embolism (HCC) 04/06/2014  . Mild chronic obstructive pulmonary disease (HCC) 04/06/2014  . Snores 04/06/2014  . H/O: obesity 04/06/2014    Past Surgical History:  Procedure Laterality Date  . ABDOMINAL HYSTERECTOMY    . Bladder mesh    . BLADDER SUSPENSION    . CHOLECYSTECTOMY    . Chonca batosa    . CHONDROPLASTY Right 08/23/2017   Procedure: CHONDROPLASTY;  Surgeon: Lyndle Herrlich, MD;  Location: [facility] ORS;  Service: Orthopedics;  Laterality: Right;  . DILATION AND CURETTAGE OF UTERUS    . KNEE ARTHROSCOPY WITH MEDIAL MENISECTOMY Right 08/23/2017   Procedure: KNEE ARTHROSCOPY WITH MEDIAL MENISECTOMY;  Surgeon: Lyndle Herrlich, MD;  Location: [facility] ORS;  Service: Orthopedics;  Laterality: Right;  . LASER ABLATION CONDYLOMA CERVICAL / VULVAR    . SYNOVECTOMY Right 08/23/2017   Procedure: SYNOVECTOMY;  Surgeon: Lyndle Herrlich, MD;  Location: [facility] ORS;  Service: Orthopedics;  Laterality: Right;    Prior to Admission medications   Medication Sig Start Date End Date Taking? Authorizing Provider  albuterol (PROAIR HFA) 108 (90 BASE) MCG/ACT inhaler Inhale 2 puffs into the lungs every 6 (six) hours as needed for wheezing or shortness of breath.  07/16/14 08/23/17  [provider]  augmented betamethasone dipropionate (DIPROLENE-AF) 0.05 % ointment Apply topically 2 (two) times daily.    [provider]  azithromycin  (ZITHROMAX) 250 MG tablet z-pack - take as directed for 5 days 09/16/18   Carlean Jews, NP  busPIRone (BUSPAR) 5 MG tablet Take 1 tablet (5 mg total) by mouth 3 (three) times daily as needed. 09/05/18   Carlean Jews, NP  Calcium Carbonate-Vitamin D (CALCIUM-VITAMIN D3 PO) Take 1 tablet by mouth daily.    [provider]  Cholecalciferol (VITAMIN D3) 2000 UNITS capsule Take 2,000 Units by mouth daily.     [provider]  clobetasol (TEMOVATE) 0.05 % external solution Apply 1 application topically 2 (two) times daily.    [provider]  fluticasone (FLONASE) 50 MCG/ACT nasal spray Place 1 spray into both nostrils daily as needed for allergies.     [provider]  HYDROcodone-acetaminophen (NORCO) 7.5-325 MG tablet Take 1 tablet by mouth 2 (two) times daily as needed for moderate pain. 05/10/18   Edward Jolly, MD  LYRICA 150 MG capsule Take 1 capsule (150 mg total) by mouth 2 (two) times daily. 09/05/18   Carlean Jews, NP  magic mouthwash w/lidocaine SOLN Take 5 mLs by mouth 4 (four) times daily. 09/19/18   Cuthriell, Delorise Royals, PA-C  meclizine (ANTIVERT) 25 MG tablet TAKE 1 TABLET BY MOUTH 3  TIMES A DAY AS NEEDED FOR  DIZZINESS 07/08/18   Lyndon Code, MD  mirabegron ER (MYRBETRIQ) 25 MG TB24 tablet Take 1 tablet (25 mg total) by mouth daily. 09/05/18   Carlean Jews, NP  oseltamivir (TAMIFLU) 75 MG capsule Take 1 capsule (75 mg total) by mouth 2 (two) times daily. 09/19/18   Cuthriell, Delorise Royals, PA-C  oxybutynin (DITROPAN XL) 15 MG 24 hr tablet Take 1 tablet (15 mg total) by mouth daily. 09/05/18   Carlean Jews, NP  pantoprazole (PROTONIX) 40 MG tablet Take 1 tablet (40 mg total) by mouth daily. 09/05/18   Carlean Jews, NP  sertraline (ZOLOFT) 100 MG tablet Take 1.5 tablets (150 mg total) by mouth daily. 09/05/18   Carlean Jews, NP  valACYclovir (VALTREX) 500 MG tablet Take 1 tablet (500 mg total) by mouth daily. Patient taking  differently: Take 1,000 mg by mouth daily as needed (for out breaks).  06/30/17   Boscia, Kathlynn Grate, NP  XARELTO 20 MG TABS tablet Take 1 tablet (20 mg total) by mouth daily. 09/05/18   Carlean Jews, NP    Allergies Cefaclor; Penicillins; and Phenytoin  Family History  Problem Relation Age of Onset  . Hypertension Mother   . Hypertension Father   . Prostate cancer Paternal Uncle   . Kidney cancer Neg Hx   . Bladder Cancer Neg Hx     Social History Social History   Tobacco Use  . Smoking status: Current Every Day Smoker    Packs/day: 3.00    Types: Cigarettes    Last attempt to quit: 08/15/2017    Years since quitting: 1.0  . Smokeless tobacco: Never Used  Substance Use Topics  . Alcohol use: No    Alcohol/week: 0.0 standard drinks  . Drug use: No     Review of Systems  Constitutional: Positive fever/chills.  Positive for body aches Eyes:  No visual changes. No discharge ENT: Positive for nasal congestion and sore throat Cardiovascular: no chest pain. Respiratory: Positive cough. No SOB. Gastrointestinal: No abdominal pain.  No nausea, no vomiting.  No diarrhea.  No constipation. Genitourinary: Negative for dysuria. No hematuria Musculoskeletal: Negative for musculoskeletal pain. Skin: Negative for rash, abrasions, lacerations, ecchymosis. Neurological: Negative for headaches, focal weakness or numbness. 10-point ROS otherwise negative.  ____________________________________________   PHYSICAL EXAM:  VITAL SIGNS: ED Triage Vitals  Enc Vitals Group     BP 09/19/18 2059 (!) 123/92     Pulse Rate 09/19/18 2059 87     Resp 09/19/18 2059 18     Temp 09/19/18 2059 99.5 F (37.5 C)     Temp Source 09/19/18 2059 Oral     SpO2 09/19/18 2059 97 %     Weight 09/19/18 2100 222 lb (100.7 kg)     Height 09/19/18 2100 5\' 3"  (1.6 m)     Head Circumference --      Peak Flow --      Pain Score 09/19/18 2105 0     Pain Loc --      Pain Edu? --      Excl. in GC? --       Constitutional: Alert and oriented. Well appearing and in no acute distress. Eyes: Conjunctivae are normal. PERRL. EOMI. Head: Atraumatic. ENT:      Ears: EACs unremarkable bilaterally.  TMs are moderately bulging bilaterally.  No indication of otitis media on exam.      Nose: Moderate congestion/rhinnorhea.  No tenderness to percussion over the sinuses.      Mouth/Throat: Mucous membranes are moist.  Oropharynx is nonerythematous and nonedematous.  Uvula is midline. Neck: No stridor.  Neck is supple with full range of motion. Hematological/Lymphatic/Immunilogical: Scattered, mobile, nontender anterior cervical lymphadenopathy. Cardiovascular: Normal rate, regular rhythm. Normal S1 and S2.  Good peripheral circulation. Respiratory: Normal respiratory effort without tachypnea or retractions. Lungs CTAB. Good air entry to the bases with no decreased or absent breath sounds. Musculoskeletal: Full range of motion to all extremities. No gross deformities appreciated. Neurologic:  Normal speech and language. No gross focal neurologic deficits are appreciated.  Skin:  Skin is warm, dry and intact. No rash noted. Psychiatric: Mood and affect are normal. Speech and behavior are normal. Patient exhibits appropriate insight and judgement.   ____________________________________________   LABS (all labs ordered are listed, but only abnormal results are displayed)  Labs Reviewed  INFLUENZA PANEL BY PCR (TYPE A & B) - Abnormal; Notable for the following components:      Result Value   Influenza B By PCR POSITIVE (*)    All other components within normal limits   ____________________________________________  EKG   ____________________________________________  RADIOLOGY   No results found.  ____________________________________________    PROCEDURES  Procedure(s) performed:    Procedures    Medications - No data to  display   ____________________________________________   INITIAL IMPRESSION / ASSESSMENT AND PLAN / ED COURSE  Pertinent labs & imaging results that were available during my care of the patient were reviewed by me and considered in my medical decision making (see chart for details).  Review of the [place] CSRS was performed in accordance of the [facility] prior to dispensing any controlled drugs.      Patient's diagnosis is consistent with influenza B.  Patient presents emergency department complaining of flulike symptoms but has also been undergoing treatment for sinus infection.  It appears that antibiotics have  resolved sinus infection for the most part.  Patient was positive for influenza on testing.  At this time, patient will be treated for influenza.  Patient is prescribed Tamiflu Magic mouthwash.  Tylenol Motrin at home.  Plenty of fluids.  Plenty of rest..  Patient is given ED precautions to return to the ED for any worsening or new symptoms.     ____________________________________________  FINAL CLINICAL IMPRESSION(S) / ED DIAGNOSES  Final diagnoses:  Influenza B      NEW MEDICATIONS STARTED DURING THIS VISIT:  ED Discharge Orders         Ordered    oseltamivir (TAMIFLU) 75 MG capsule  2 times daily     09/19/18 2222    magic mouthwash w/lidocaine SOLN  4 times daily    Note to Pharmacy:  Dispense in a 1/1/1 ratio. Use lidocaine, diphenhydramine, prednisolone   09/19/18 2222              This chart was dictated using voice recognition software/Dragon. Despite best efforts to proofread, errors can occur which can change the meaning. Any change was purely unintentional.    Racheal Patches, PA-C 09/19/18 2313    Rockne Menghini, MD 09/19/18 [phone]

## 2018-09-22 ENCOUNTER — Other Ambulatory Visit: Payer: Self-pay

## 2018-09-22 DIAGNOSIS — K219 Gastro-esophageal reflux disease without esophagitis: Secondary | ICD-10-CM

## 2018-09-22 MED ORDER — PANTOPRAZOLE SODIUM 40 MG PO TBEC: 40.0000 mg | DELAYED_RELEASE_TABLET | Freq: Every day | ORAL | 3 refills | Status: DC

## 2018-09-26 ENCOUNTER — Telehealth: Payer: Self-pay | Admitting: *Deleted

## 2018-09-26 NOTE — Telephone Encounter (Signed)
Called patient and she stated that she needs a refill on her Hydrocodone. Informed the patient we have not seen her in our office since October and that we normally do not refill nacrs without being seen. Patient stated that she was told that all she needed to do was call since she was not abusive.   What are your thoughts. I will be glad to call the patient back and inform her either way.

## 2018-09-26 NOTE — Telephone Encounter (Signed)
, °

## 2018-09-29 ENCOUNTER — Other Ambulatory Visit: Payer: Self-pay

## 2018-09-29 DIAGNOSIS — K219 Gastro-esophageal reflux disease without esophagitis: Secondary | ICD-10-CM

## 2018-09-29 MED ORDER — PANTOPRAZOLE SODIUM 40 MG PO TBEC: 40.0000 mg | DELAYED_RELEASE_TABLET | Freq: Every day | ORAL | 3 refills | Status: DC

## 2018-10-05 ENCOUNTER — Telehealth: Payer: Self-pay

## 2018-10-05 NOTE — Telephone Encounter (Signed)
Spoke with Patient, she had the flu a week and a half ago, she's been afebrile , no exposure to anyone with flu like symptoms. She coming in for Med refill @1030  tomorrow

## 2018-10-06 ENCOUNTER — Encounter: Payer: Self-pay | Admitting: Student in an Organized Health Care Education/Training Program

## 2018-10-06 ENCOUNTER — Encounter: Payer: Self-pay | Admitting: Urology

## 2018-10-06 ENCOUNTER — Other Ambulatory Visit: Payer: Self-pay

## 2018-10-06 ENCOUNTER — Ambulatory Visit (INDEPENDENT_AMBULATORY_CARE_PROVIDER_SITE_OTHER): Payer: Medicare Other | Admitting: Urology

## 2018-10-06 ENCOUNTER — Ambulatory Visit
Payer: Medicare Other | Attending: Student in an Organized Health Care Education/Training Program | Admitting: Student in an Organized Health Care Education/Training Program

## 2018-10-06 VITALS — BP 114/78 | HR 75 | Ht 63.0 in | Wt 221.0 lb

## 2018-10-06 VITALS — BP 135/82 | HR 69 | Temp 97.9°F | Resp 18 | Ht 63.0 in | Wt 220.0 lb

## 2018-10-06 DIAGNOSIS — I2699 Other pulmonary embolism without acute cor pulmonale: Secondary | ICD-10-CM | POA: Insufficient documentation

## 2018-10-06 DIAGNOSIS — J449 Chronic obstructive pulmonary disease, unspecified: Secondary | ICD-10-CM | POA: Diagnosis not present

## 2018-10-06 DIAGNOSIS — G894 Chronic pain syndrome: Secondary | ICD-10-CM | POA: Insufficient documentation

## 2018-10-06 DIAGNOSIS — M797 Fibromyalgia: Secondary | ICD-10-CM | POA: Diagnosis not present

## 2018-10-06 DIAGNOSIS — G35 Multiple sclerosis: Secondary | ICD-10-CM | POA: Diagnosis not present

## 2018-10-06 DIAGNOSIS — Z87448 Personal history of other diseases of urinary system: Secondary | ICD-10-CM | POA: Diagnosis not present

## 2018-10-06 DIAGNOSIS — N3946 Mixed incontinence: Secondary | ICD-10-CM | POA: Diagnosis not present

## 2018-10-06 DIAGNOSIS — Z8639 Personal history of other endocrine, nutritional and metabolic disease: Secondary | ICD-10-CM | POA: Insufficient documentation

## 2018-10-06 DIAGNOSIS — N3941 Urge incontinence: Secondary | ICD-10-CM

## 2018-10-06 LAB — URINALYSIS, COMPLETE
Bilirubin, UA: NEGATIVE
Glucose, UA: NEGATIVE
Ketones, UA: NEGATIVE
NITRITE UA: NEGATIVE
Specific Gravity, UA: >1.03 — ABNORMAL HIGH (ref 1.005–1.030)
Urobilinogen, Ur: 0.2 mg/dL (ref 0.2–1.0)
pH, UA: 6 (ref 5.0–7.5)

## 2018-10-06 LAB — MICROSCOPIC EXAMINATION

## 2018-10-06 LAB — BLADDER SCAN AMB NON-IMAGING

## 2018-10-06 MED ORDER — OXYBUTYNIN CHLORIDE ER 15 MG PO TB24: 15.0000 mg | ORAL_TABLET | Freq: Every day | ORAL | 3 refills | Status: DC

## 2018-10-06 MED ORDER — HYDROCODONE-ACETAMINOPHEN 7.5-325 MG PO TABS: 1.0000 | ORAL_TABLET | Freq: Two times a day (BID) | ORAL | 0 refills | Status: DC | PRN

## 2018-10-06 MED ORDER — MIRABEGRON ER 25 MG PO TB24: 25.0000 mg | ORAL_TABLET | Freq: Every day | ORAL | 3 refills | Status: AC

## 2018-10-06 NOTE — Progress Notes (Signed)
Patient's Name: Tracey Chandler  MRN: [id number]  Referring Provider: Lavera Guise, MD  DOB: 1966/03/11  PCP: Lavera Guise, MD  DOS: 10/06/2018  Note by: Gillis Santa, MD  Service setting: Ambulatory outpatient  Specialty: Interventional Pain Management  Location: [facility] (AMB) Pain Management Facility    Patient type: Established   Primary Reason(s) for Visit: Encounter for prescription drug management. (Level of risk: moderate)  CC: Medication Refill; Back Pain; Hip Pain; and Shoulder Pain  HPI  Tracey Chandler is a 53 y.o. year old, female patient, who comes today for a medication management evaluation. Tracey Chandler has Pulmonary embolism ([place]); Abnormal cells of cervix; Mild chronic obstructive pulmonary disease ([place]); Discharge from the vagina; Fibromyalgia; Multiple sclerosis ([place]); Snores; H/O: obesity; Mixed incontinence; Microscopic hematuria; Osteoarthritis of knee; Abdominal pain, acute, right lower quadrant; Fever and chills; Acute non-recurrent frontal sinusitis; Generalized anxiety disorder with panic attacks; Gastroesophageal reflux disease without esophagitis; Depression, recurrent ([place]); Screening for breast cancer; and Irritable bowel syndrome with both constipation and diarrhea on their problem list. Her primarily concern today is the Medication Refill; Back Pain; Hip Pain; and Shoulder Pain  Pain Assessment: Location: Lower, Left, Mid, Upper Back Radiating: Pain radaites all over the back area  Onset: More than a month ago Duration: Chronic pain Quality: Constant, Aching, Throbbing Severity: 7 /10 (subjective, self-reported pain score)  Note: Reported level is inconsistent with clinical observations.                         When using our objective Pain Scale, levels between 6 and 10/10 are said to belong in an emergency room, as it progressively worsens from a 6/10, described as severely limiting, requiring emergency care not usually available at an outpatient pain management facility. At a 6/10  level, communication becomes difficult and requires great effort. Assistance to reach the emergency department may be required. Facial flushing and profuse sweating along with potentially dangerous increases in heart rate and blood pressure will be evident. Effect on ADL: "I've fallen because of the pain more than usual, walking, and bending over makes it worse"  Timing: Constant Modifying factors: Hydrocodone-APAP and laying down BP: 135/82  HR: 69  Tracey Chandler was last scheduled for an appointment on 05/10/2018 for medication management. During today's appointment we reviewed Tracey Chandler's chronic pain status, as well as her outpatient medication regimen.  Here for medication refill.  Last visit with me was in October 2019.  No significant changes in her medical history.  Patient did sustain for falls since her last visit with me related to walking outside in the dark and losing her balance while the other 2 were due to her tripping on an object in her house.  No fractures.  The patient  reports no history of drug use. Her body mass index is 38.97 kg/m.  Further details on both, my assessment(s), as well as the proposed treatment plan, please see below.  Controlled Substance Pharmacotherapy Assessment REMS (Risk Evaluation and Mitigation Strategy)  Analgesic: Hydrocodone 7.5 mg daily PRN MME/day: 7.5 mg/day.  Janne Napoleon, RN  10/06/2018 10:52 AM  Sign when Signing Visit Nursing Pain Medication Assessment:  Safety precautions to be maintained throughout the outpatient stay will include: orient to surroundings, keep bed in low position, maintain call bell within reach at all times, provide assistance with transfer out of bed and ambulation.  Medication Inspection Compliance: Pill count conducted under aseptic conditions, in front of the patient. Neither  the pills nor the bottle was removed from the patient's sight at any time. Once count was completed pills were immediately returned to the  patient in their original bottle.  Medication: Hydrocodone/APAP Pill/Patch Count: 11 of 60 pills remain Pill/Patch Appearance: Markings consistent with prescribed medication Bottle Appearance: Standard pharmacy container. Clearly labeled. Filled Date: 72 / 22 / 2019 Last Medication intake:  Today   Pharmacokinetics: Liberation and absorption (onset of action): WNL Distribution (time to peak effect): WNL Metabolism and excretion (duration of action): WNL         Pharmacodynamics: Desired effects: Analgesia: Tracey Chandler reports >50% benefit. Functional ability: Patient reports that medication allows her to accomplish basic ADLs Clinically meaningful improvement in function (CMIF): Sustained CMIF goals met Perceived effectiveness: Described as relatively effective, allowing for increase in activities of daily living (ADL) Undesirable effects: Side-effects or Adverse reactions: None reported Monitoring: [place] PMP: Online review of the past 48-monthperiod conducted. Compliant with practice rules and regulations Last UDS on record: Summary  Date Value Ref Range Status  02/01/2018 FINAL  Final    Comment:    ==================================================================== TOXASSURE COMP DRUG ANALYSIS,UR ==================================================================== Test                             Result       Flag       Units Drug Present not Declared for Prescription Verification   Pregabalin                     PRESENT      UNEXPECTED   Sertraline                     PRESENT      UNEXPECTED   Desmethylsertraline            PRESENT      UNEXPECTED    Desmethylsertraline is an expected metabolite of sertraline. Drug Absent but Declared for Prescription Verification   Hydrocodone                    Not Detected UNEXPECTED ng/mg creat   Acetaminophen                  Not Detected UNEXPECTED    Acetaminophen, as indicated in the declared medication list, is    not always  detected even when used as directed. ==================================================================== Test                      Result    Flag   Units      Ref Range   Creatinine              97               mg/dL      >=20 ==================================================================== Declared Medications:  The flagging and interpretation on this report are based on the  following declared medications.  Unexpected results may arise from  inaccuracies in the declared medications.  **Note: The testing scope of this panel includes these medications:  Hydrocodone (Hydrocodone-Acetaminophen)  **Note: The testing scope of this panel does not include small to  moderate amounts of these reported medications:  Acetaminophen (Hydrocodone-Acetaminophen)  **Note: The testing scope of this panel does not include following  reported medications:  Albuterol  Betamethasone  Buspirone  Calcium carbonate (Calcium Carb/Vitamin D)  Clobetasol (Temovate)  Fluticasone (Flonase)  Meclizine  Mirabegron (Myrbetriq)  Oxybutynin  Vitamin D (Calcium Carb/Vitamin D)  Vitamin D3 ==================================================================== For clinical consultation, please call [phone]. ====================================================================    UDS interpretation: Compliant          Medication Assessment Form: Reviewed. Patient indicates being compliant with therapy Treatment compliance: Compliant Risk Assessment Profile: Aberrant behavior: See initial evaluations. None observed or detected today Comorbid factors increasing risk of overdose: See initial evaluation. No additional risks detected today Opioid risk tool (ORT):  Opioid Risk  05/10/2018  Alcohol 0  Illegal Drugs 0  Rx Drugs 0  Alcohol 0  Illegal Drugs 0  Rx Drugs 0  Age between 16-45 years  0  History of Preadolescent Sexual Abuse -  Psychological Disease 2  ADD Negative  OCD Negative  Bipolar  Negative  Depression 0  Opioid Risk Tool Scoring 2  Opioid Risk Interpretation Low Risk    ORT Scoring interpretation table:  Score <3 = Low Risk for SUD  Score between 4-7 = Moderate Risk for SUD  Score >8 = High Risk for Opioid Abuse   Risk of substance use disorder (SUD): Low  Risk Mitigation Strategies:  Patient Counseling: Covered Patient-Prescriber Agreement (PPA): Present and active  Notification to other healthcare providers: Done  Pharmacologic Plan: No change in therapy, at this time.             Laboratory Chemistry  Inflammation Markers (CRP: Acute Phase) (ESR: Chronic Phase) No results found for: CRP, ESRSEDRATE, LATICACIDVEN                       Rheumatology Markers No results found for: RF, ANA, LABURIC, URICUR, LYMEIGGIGMAB, LYMEABIGMQN, HLAB27                      Renal Function Markers Lab Results  Component Value Date   BUN 15 09/12/2017   CREATININE 0.75 09/12/2017   GFRAA >60 09/12/2017   GFRNONAA >60 09/12/2017                             Hepatic Function Markers Lab Results  Component Value Date   AST 21 03/30/2014   ALT 26 03/30/2014   ALBUMIN 3.1 (L) 03/30/2014   ALKPHOS 73 03/30/2014                        Electrolytes Lab Results  Component Value Date   NA 139 09/12/2017   K 3.9 09/12/2017   CL 104 09/12/2017   CALCIUM 9.5 09/12/2017                        Neuropathy Markers No results found for: VITAMINB12, FOLATE, HGBA1C, HIV                      CNS Tests No results found for: COLORCSF, APPEARCSF, RBCCOUNTCSF, WBCCSF, POLYSCSF, LYMPHSCSF, EOSCSF, PROTEINCSF, GLUCCSF, JCVIRUS, CSFOLI, IGGCSF                      Bone Pathology Markers No results found for: VD25OH, [id number], [id number], [id number], 25OHVITD1, 25OHVITD2, 25OHVITD3, TESTOFREE, TESTOSTERONE                       Coagulation Parameters Lab Results  Component Value Date   INR 2.00 09/12/2017   LABPROT 22.5 (H) 09/12/2017   APTT  115.1 (H) 03/30/2014   PLT  146 (L) 09/12/2017                        Cardiovascular Markers Lab Results  Component Value Date   TROPONINI < 0.02 03/30/2014   HGB 15.1 09/12/2017   HCT 44.6 09/12/2017                         CA Markers No results found for: CEA, CA125, LABCA2                      Endocrine Markers No results found for: TSH, FREET4, TESTOFREE, TESTOSTERONE, ESTRADIOL, ESTRADIOLPCT, ESTRADIOLFRE                      Note: Lab results reviewed.  Recent Diagnostic Imaging Results  US Venous Img Lower Unilateral Right CLINICAL DATA:  Right lower extremity pain and swelling.  EXAM: RIGHT LOWER EXTREMITY VENOUS DOPPLER ULTRASOUND  TECHNIQUE: Gray-scale sonography with graded compression, as well as color Doppler and duplex ultrasound were performed to evaluate the lower extremity deep venous systems from the level of the common femoral vein and including the common femoral, femoral, profunda femoral, popliteal and calf veins including the posterior tibial, peroneal and gastrocnemius veins when visible. The superficial great saphenous vein was also interrogated. Spectral Doppler was utilized to evaluate flow at rest and with distal augmentation maneuvers in the common femoral, femoral and popliteal veins.  COMPARISON:  09/12/2017  FINDINGS: Contralateral Common Femoral Vein: Respiratory phasicity is normal and symmetric with the symptomatic side. No evidence of thrombus. Normal compressibility.  Common Femoral Vein: No evidence of thrombus. Normal compressibility, respiratory phasicity and response to augmentation.  Saphenofemoral Junction: No evidence of thrombus. Normal compressibility and flow on color Doppler imaging.  Profunda Femoral Vein: No evidence of thrombus. Normal compressibility and flow on color Doppler imaging.  Femoral Vein: No evidence of thrombus. Normal compressibility, respiratory phasicity and response to augmentation.  Popliteal Vein: No evidence of  thrombus. Normal compressibility, respiratory phasicity and response to augmentation.  Calf Veins: No evidence of thrombus. Normal compressibility and flow on color Doppler imaging.  Superficial Great Saphenous Vein: No evidence of thrombus. Normal compressibility.  Venous Reflux:  None.  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  IMPRESSION: No evidence of deep venous thrombosis.  Electronically Signed   By: Lajean Manes M.D.   On: 08/19/2018 19:59  Complexity Note: Imaging results reviewed. Results shared with Tracey Chandler, using Layman's terms.                               Meds   Current Outpatient Medications:  .  augmented betamethasone dipropionate (DIPROLENE-AF) 0.05 % ointment, Apply topically 2 (two) times daily., Disp: , Rfl:  .  busPIRone (BUSPAR) 5 MG tablet, Take 1 tablet (5 mg total) by mouth 3 (three) times daily as needed., Disp: 270 tablet, Rfl: 3 .  Calcium Carbonate-Vitamin D (CALCIUM-VITAMIN D3 PO), Take 1 tablet by mouth daily., Disp: , Rfl:  .  Cholecalciferol (VITAMIN D3) 2000 UNITS capsule, Take 2,000 Units by mouth daily. , Disp: , Rfl:  .  fluticasone (FLONASE) 50 MCG/ACT nasal spray, Place 1 spray into both nostrils daily as needed for allergies. , Disp: , Rfl:  .  HYDROcodone-acetaminophen (NORCO) 7.5-325 MG tablet, Take 1 tablet by mouth 2 (two) times daily as needed for moderate pain., Disp: 60 tablet, Rfl: 0 .  LYRICA 150 MG capsule, Take 1 capsule (150 mg total) by mouth 2 (two) times daily., Disp: 180 capsule, Rfl: 1 .  magic mouthwash w/lidocaine SOLN, Take 5 mLs by mouth 4 (four) times daily., Disp: 240 mL, Rfl: 0 .  meclizine (ANTIVERT) 25 MG tablet, TAKE 1 TABLET BY MOUTH 3  TIMES A DAY AS NEEDED FOR  DIZZINESS, Disp: 270 tablet, Rfl: 0 .  mirabegron ER (MYRBETRIQ) 25 MG TB24 tablet, Take 1 tablet (25 mg total) by mouth daily., Disp: 90 tablet,  Rfl: 3 .  oxybutynin (DITROPAN XL) 15 MG 24 hr tablet, Take 1 tablet (15 mg total) by mouth daily., Disp: 90 tablet, Rfl: 3 .  pantoprazole (PROTONIX) 40 MG tablet, Take 1 tablet (40 mg total) by mouth daily., Disp: 90 tablet, Rfl: 3 .  sertraline (ZOLOFT) 100 MG tablet, Take 1.5 tablets (150 mg total) by mouth daily., Disp: 135 tablet, Rfl: 0 .  valACYclovir (VALTREX) 500 MG tablet, Take 1 tablet (500 mg total) by mouth daily. (Patient taking differently: Take 1,000 mg by mouth daily as needed (for out breaks). ), Disp: 90 tablet, Rfl: 4 .  XARELTO 20 MG TABS tablet, Take 1 tablet (20 mg total) by mouth daily., Disp: 90 tablet, Rfl: 4  ROS  Constitutional: Denies any fever or chills Gastrointestinal: No reported hemesis, hematochezia, vomiting, or acute GI distress Musculoskeletal: Denies any acute onset joint swelling, redness, loss of ROM, or weakness Neurological: No reported episodes of acute onset apraxia, aphasia, dysarthria, agnosia, amnesia, paralysis, loss of coordination, or loss of consciousness  Allergies  Tracey Chandler is allergic to cefaclor; penicillins; and phenytoin.  PFSH  Drug: Tracey Chandler  reports no history of drug use. Alcohol:  reports no history of alcohol use. Tobacco:  reports that Tracey Chandler has been smoking cigarettes. Tracey Chandler has been smoking about 3.00 packs per day. Tracey Chandler has never used smokeless tobacco. Medical:  has a past medical history of Anxiety, Cervical dysplasia, COPD (chronic obstructive pulmonary disease) ([place]), Discharge from the vagina (11/19/2014), DVT (deep venous thrombosis) ([place]), Fibromyalgia, GERD (gastroesophageal reflux disease), H/O: obesity (04/06/2014), Hematuria, Herpes, genital, History of blood clotting disorder, Hypertension, Microscopic hematuria (11/23/2015), Mixed incontinence, MS (multiple sclerosis) ([place]), Multiple sclerosis ([place]) (06/03/2015), OAB (overactive bladder), Obesity, Pulmonary embolism ([place]), Snores, and Stroke ([place]). Surgical: Tracey Chandler  has  a past surgical history that includes Abdominal hysterectomy; Bladder mesh; Chonca batosa; Dilation and curettage of uterus; Laser ablation condyloma cervical / vulvar; Cholecystectomy; Bladder suspension; Knee arthroscopy with medial menisectomy (Right, 08/23/2017); Synovectomy (Right, 08/23/2017); and Chondroplasty (Right, 08/23/2017). Family: family history includes Hypertension in her father and mother; Prostate cancer in her paternal uncle.  Constitutional Exam  General appearance: Well nourished, well developed, and well hydrated. In no apparent acute distress Vitals:   10/06/18 1040  BP: 135/82  Pulse: 69  Resp: 18  Temp: 97.9 F (36.6 C)  SpO2: 99%  Weight: 220 lb (99.8 kg)  Height: 5' 3" (1.6 m)   BMI Assessment: Estimated body mass index is 38.97 kg/m as calculated from the following:   Height as of this encounter: 5' 3" (1.6 m).   Weight as of this encounter: 220 lb (99.8 kg).  BMI interpretation table: BMI level Category Range association with higher incidence of chronic pain  <18 kg/m2 Underweight   18.5-24.9 kg/m2 Ideal body weight   25-29.9  kg/m2 Overweight Increased incidence by 20%  30-34.9 kg/m2 Obese (Class I) Increased incidence by 68%  35-39.9 kg/m2 Severe obesity (Class II) Increased incidence by 136%  >40 kg/m2 Extreme obesity (Class III) Increased incidence by 254%   Patient's current BMI Ideal Body weight  Body mass index is 38.97 kg/m. Ideal body weight: 52.4 kg (115 lb 8.3 oz) Adjusted ideal body weight: 71.4 kg (157 lb 5 oz)   BMI Readings from Last 4 Encounters:  10/06/18 39.15 kg/m  10/06/18 38.97 kg/m  09/19/18 39.33 kg/m  09/05/18 39.43 kg/m   Wt Readings from Last 4 Encounters:  10/06/18 221 lb (100.2 kg)  10/06/18 220 lb (99.8 kg)  09/19/18 222 lb (100.7 kg)  09/05/18 222 lb 9.6 oz (101 kg)  Psych/Mental status: Alert, oriented x 3 (person, place, & time)       Eyes: PERLA Respiratory: No evidence of acute respiratory  distress  Cervical Spine Area Exam  Skin & Axial Inspection: No masses, redness, edema, swelling, or associated skin lesions Alignment: Symmetrical Functional ROM: Unrestricted ROM      Stability: No instability detected Muscle Tone/Strength: Functionally intact. No obvious neuro-muscular anomalies detected. Sensory (Neurological): Unimpaired Palpation: No palpable anomalies              Upper Extremity (UE) Exam    Side: Right upper extremity  Side: Left upper extremity  Skin & Extremity Inspection: Skin color, temperature, and hair growth are WNL. No peripheral edema or cyanosis. No masses, redness, swelling, asymmetry, or associated skin lesions. No contractures.  Skin & Extremity Inspection: Skin color, temperature, and hair growth are WNL. No peripheral edema or cyanosis. No masses, redness, swelling, asymmetry, or associated skin lesions. No contractures.  Functional ROM: Unrestricted ROM          Functional ROM: Unrestricted ROM          Muscle Tone/Strength: Functionally intact. No obvious neuro-muscular anomalies detected.  Muscle Tone/Strength: Functionally intact. No obvious neuro-muscular anomalies detected.  Sensory (Neurological): Unimpaired          Sensory (Neurological): Unimpaired          Palpation: No palpable anomalies              Palpation: No palpable anomalies              Provocative Test(s):  Phalen's test: deferred Tinel's test: deferred Apley's scratch test (touch opposite shoulder):  Action 1 (Across chest): deferred Action 2 (Overhead): deferred Action 3 (LB reach): deferred   Provocative Test(s):  Phalen's test: deferred Tinel's test: deferred Apley's scratch test (touch opposite shoulder):  Action 1 (Across chest): deferred Action 2 (Overhead): deferred Action 3 (LB reach): deferred    Thoracic Spine Area Exam  Skin & Axial Inspection: No masses, redness, or swelling Alignment: Symmetrical Functional ROM: Unrestricted ROM Stability: No  instability detected Muscle Tone/Strength: Functionally intact. No obvious neuro-muscular anomalies detected. Sensory (Neurological): Unimpaired Muscle strength & Tone: No palpable anomalies  Lumbar Spine Area Exam  Skin & Axial Inspection: No masses, redness, or swelling Alignment: Symmetrical Functional ROM: Unrestricted ROM       Stability: No instability detected Muscle Tone/Strength: Functionally intact. No obvious neuro-muscular anomalies detected. Sensory (Neurological): Unimpaired Palpation: No palpable anomalies       Provocative Tests: Hyperextension/rotation test: deferred today       Lumbar quadrant test (Kemp's test): deferred today       Lateral bending test: deferred today       Patrick's Maneuver: deferred  today                   FABER* test: deferred today                   S-I anterior distraction/compression test: deferred today         S-I lateral compression test: deferred today         S-I Thigh-thrust test: deferred today         S-I Gaenslen's test: deferred today         *(Flexion, ABduction and External Rotation)  Gait & Posture Assessment  Ambulation: Unassisted Gait: Relatively normal for age and body habitus Posture: WNL   Lower Extremity Exam    Side: Right lower extremity  Side: Left lower extremity  Stability: No instability observed          Stability: No instability observed          Skin & Extremity Inspection: Skin color, temperature, and hair growth are WNL. No peripheral edema or cyanosis. No masses, redness, swelling, asymmetry, or associated skin lesions. No contractures.  Skin & Extremity Inspection: Skin color, temperature, and hair growth are WNL. No peripheral edema or cyanosis. No masses, redness, swelling, asymmetry, or associated skin lesions. No contractures.  Functional ROM: Unrestricted ROM                  Functional ROM: Unrestricted ROM                  Muscle Tone/Strength: Functionally intact. No obvious neuro-muscular  anomalies detected.  Muscle Tone/Strength: Functionally intact. No obvious neuro-muscular anomalies detected.  Sensory (Neurological): Unimpaired        Sensory (Neurological): Unimpaired        DTR: Patellar: deferred today Achilles: deferred today Plantar: deferred today  DTR: Patellar: deferred today Achilles: deferred today Plantar: deferred today  Palpation: No palpable anomalies  Palpation: No palpable anomalies   Assessment   Status Diagnosis  Controlled Controlled Controlled 1. Multiple sclerosis ([place])   2. Fibromyalgia   3. Mild chronic obstructive pulmonary disease ([place])   4. H/O: obesity   5. Other pulmonary embolism without acute cor pulmonale, unspecified chronicity ([facility]) (on Xarelto)   6. Chronic pain syndrome      General Recommendations: The pain condition that the patient suffers from is best treated with a multidisciplinary approach that involves an increase in physical activity to prevent de-conditioning and worsening of the pain cycle, as well as psychological counseling (formal and/or informal) to address the co-morbid psychological affects of pain. Treatment will often involve judicious use of pain medications and interventional procedures to decrease the pain, allowing the patient to participate in the physical activity that will ultimately produce long-lasting pain reductions. The goal of the multidisciplinary approach is to return the patient to a higher level of overall function and to restore their ability to perform activities of daily living.  53 year old female with a history of multiple sclerosis, fibromyalgia (MS diagnosed in 57) who presents for chronic pain management. 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 Patient is currently anticoagulated with Xarelto given her increased propensity for thrombo-embolism. Tracey Chandler is unable to tolerate NSAIDs and they  should be avoided. Patient has tried various pain medications in the past including tricyclic antidepressants, multiple pole muscle relaxants including baclofen, Flexeril, Robaxin, various opioid medications including tramadol which were not effective, Vicodin which  was not effective, Percocet which was not effective, Demerol which was not effective, Tylenol with codeine which made patient dizzy. Tracey Chandler does find benefit with as needed hydrocodone. Tracey Chandler takes these medications very sparingly and usually 60 tablets will last her over 3 to 4 months (last fill was 06/11/2018).  [place] PMP checked, UDS appropriate.  We will repeat at next visit (annual).  Plan of Care  Pharmacotherapy (Medications Ordered): Meds ordered this encounter  Medications  . HYDROcodone-acetaminophen (NORCO) 7.5-325 MG tablet    Sig: Take 1 tablet by mouth 2 (two) times daily as needed for moderate pain.    Dispense:  60 tablet    Refill:  0    Do not place this medication, or any other prescription from our practice, on "Automatic Refill". Patient may have prescription filled one day early if pharmacy is closed on scheduled refill date.    Provider-requested follow-up: Return if symptoms worsen or fail to improve.  Future Appointments  Date Time Provider [place]  10/11/2018 11:00 AM Lin Landsman, MD [facility] None  03/14/2019  1:45 PM Ronnell Freshwater, NP [place] None    Primary Care Physician: Lavera Guise, MD Location: [facility] Note by: Gillis Santa, M.D Date: 10/06/2018; Time: 11:35 AM  There are no Patient Instructions on file for this visit.

## 2018-10-06 NOTE — Progress Notes (Signed)
Nursing Pain Medication Assessment:  Safety precautions to be maintained throughout the outpatient stay will include: orient to surroundings, keep bed in low position, maintain call bell within reach at all times, provide assistance with transfer out of bed and ambulation.  Medication Inspection Compliance: Pill count conducted under aseptic conditions, in front of the patient. Neither the pills nor the bottle was removed from the patient's sight at any time. Once count was completed pills were immediately returned to the patient in their original bottle.  Medication: Hydrocodone/APAP Pill/Patch Count: 11 of 60 pills remain Pill/Patch Appearance: Markings consistent with prescribed medication Bottle Appearance: Standard pharmacy container. Clearly labeled. Filled Date: 53 / 22 / 2019 Last Medication intake:  Today

## 2018-10-06 NOTE — Progress Notes (Signed)
10/06/2018 11:41 AM   Tracey Chandler March 22, 1966 [id number]  Referring provider: Lyndon Code, MD [address] [place], [place] [address]  Chief Complaint  Patient presents with  . Follow-up    HPI: Tracey Chandler is a 53 y.o. female Caucasian with a history of multiple sclerosis, mixed incontinence and a history of hematuria who presents today for an annual follow-up.  Mixed urinary incontinence Patient has failed Myrbetriq, Vesicare and PTNS therapy.  In 09/21/2017, she was on a course of oxybutynin 15 mg XL and Myrbetriq 25 mg.  The patient was then experiencing urgency x 0-3 (stable), frequency x 4-7 (stable), was still not restricting fluids to avoid visits to the restroom, was still engaging in toilet mapping, incontinence x 4-7 (stable) and nocturia x 0-3 (stable).  Her BP was 122/80.  Her PVR was 24 mL.  Her main complaints were frequency, urgency, nocturia, incontinence and intermittency.  She had not seen a significant improvement with her urinary symptoms with the addition of the Myrbetriq.  She was still having a "seepage."  Today (10/06/2018), the patient is  experiencing urgency x 4-7 (worsened), frequency x 4-7 (stable), not restricting fluids to avoid visits to the restroom 0-3, is engaging in toilet mapping, incontinence x 0-3 (improved) and nocturia x 0-3 (stable).  Her BP is 114/78.  Her PVR is 45 mL.  She reports that things have been 'so-so' as her vaginal discharge has continued without a cause having been determined yet; she reports her gynecologist is planning to send her for an MRI.  She reports she is still taking the oxybutynin and Myrbetriq have been effective, and taking both together at night is most effective against the urgency.  Patient denies gross hematuria.  History of hematuria CTU and cystoscopy performed in 2015 were negative for malignancies.   UA today was positive for > 30 WBC's, 3-10 RBC's and many bacteria.     PMH: Past Medical History:   Diagnosis Date  . Anxiety   . Cervical dysplasia   . COPD (chronic obstructive pulmonary disease) (HCC)   . Discharge from the vagina 11/19/2014  . DVT (deep venous thrombosis) (HCC)   . Fibromyalgia   . GERD (gastroesophageal reflux disease)   . H/O: obesity 04/06/2014  . Hematuria   . Herpes, genital   . History of blood clotting disorder   . Hypertension    lost weight/no meds for 10 years  . Microscopic hematuria 11/23/2015  . Mixed incontinence   . MS (multiple sclerosis) (HCC)   . Multiple sclerosis (HCC) 06/03/2015  . OAB (overactive bladder)   . Obesity   . Pulmonary embolism (HCC)   . Snores   . Stroke [facility])     Surgical History: Past Surgical History:  Procedure Laterality Date  . ABDOMINAL HYSTERECTOMY    . Bladder mesh    . BLADDER SUSPENSION    . CHOLECYSTECTOMY    . Chonca batosa    . CHONDROPLASTY Right 08/23/2017   Procedure: CHONDROPLASTY;  Surgeon: Lyndle Herrlich, MD;  Location: [facility] ORS;  Service: Orthopedics;  Laterality: Right;  . DILATION AND CURETTAGE OF UTERUS    . KNEE ARTHROSCOPY WITH MEDIAL MENISECTOMY Right 08/23/2017   Procedure: KNEE ARTHROSCOPY WITH MEDIAL MENISECTOMY;  Surgeon: Lyndle Herrlich, MD;  Location: [facility] ORS;  Service: Orthopedics;  Laterality: Right;  . LASER ABLATION CONDYLOMA CERVICAL / VULVAR    . SYNOVECTOMY Right 08/23/2017   Procedure: SYNOVECTOMY;  Surgeon: Lyndle Herrlich, MD;  Location: [facility]  ORS;  Service: Orthopedics;  Laterality: Right;    Home Medications:  Allergies as of 10/06/2018      Reactions   Cefaclor Other (See Comments)    swells face and turns red   Penicillins Hives, Other (See Comments)   Has patient had a PCN reaction causing immediate rash, facial/tongue/throat swelling, SOB or lightheadedness with hypotension: Unknown Has patient had a PCN reaction causing severe rash involving mucus membranes or skin necrosis: No Has patient had a PCN reaction that required hospitalization: Unknown Has patient had a PCN  reaction occurring within the last 10 years: No If all of the above answers are "NO", then may proceed with Cephalosporin use.   Phenytoin Hives   Other reaction(s): Unknown      Medication List       Accurate as of October 06, 2018 11:41 AM. Always use your most recent med list.        augmented betamethasone dipropionate 0.05 % ointment Commonly known as:  DIPROLENE-AF Apply topically 2 (two) times daily.   busPIRone 5 MG tablet Commonly known as:  BUSPAR Take 1 tablet (5 mg total) by mouth 3 (three) times daily as needed.   CALCIUM-VITAMIN D3 PO Take 1 tablet by mouth daily.   fluticasone 50 MCG/ACT nasal spray Commonly known as:  FLONASE Place 1 spray into both nostrils daily as needed for allergies.   HYDROcodone-acetaminophen 7.5-325 MG tablet Commonly known as:  Norco Take 1 tablet by mouth 2 (two) times daily as needed for moderate pain.   Lyrica 150 MG capsule Generic drug:  pregabalin Take 1 capsule (150 mg total) by mouth 2 (two) times daily.   magic mouthwash w/lidocaine Soln Take 5 mLs by mouth 4 (four) times daily.   meclizine 25 MG tablet Commonly known as:  ANTIVERT TAKE 1 TABLET BY MOUTH 3  TIMES A DAY AS NEEDED FOR  DIZZINESS   mirabegron ER 25 MG Tb24 tablet Commonly known as:  Myrbetriq Take 1 tablet (25 mg total) by mouth daily.   oxybutynin 15 MG 24 hr tablet Commonly known as:  DITROPAN XL Take 1 tablet (15 mg total) by mouth daily.   pantoprazole 40 MG tablet Commonly known as:  PROTONIX Take 1 tablet (40 mg total) by mouth daily.   sertraline 100 MG tablet Commonly known as:  ZOLOFT Take 1.5 tablets (150 mg total) by mouth daily.   valACYclovir 500 MG tablet Commonly known as:  VALTREX Take 1 tablet (500 mg total) by mouth daily.   Vitamin D3 50 MCG (2000 UT) capsule Take 2,000 Units by mouth daily.   Xarelto 20 MG Tabs tablet Generic drug:  rivaroxaban Take 1 tablet (20 mg total) by mouth daily.       Allergies:   Allergies  Allergen Reactions  . Cefaclor Other (See Comments)     swells face and turns red  . Penicillins Hives and Other (See Comments)    Has patient had a PCN reaction causing immediate rash, facial/tongue/throat swelling, SOB or lightheadedness with hypotension: Unknown Has patient had a PCN reaction causing severe rash involving mucus membranes or skin necrosis: No Has patient had a PCN reaction that required hospitalization: Unknown Has patient had a PCN reaction occurring within the last 10 years: No If all of the above answers are "NO", then may proceed with Cephalosporin use.   Marland Kitchen Phenytoin Hives    Other reaction(s): Unknown    Family History: Family History  Problem Relation Age of Onset  .  Hypertension Mother   . Hypertension Father   . Prostate cancer Paternal Uncle   . Kidney cancer Neg Hx   . Bladder Cancer Neg Hx     Social History:  reports that she has been smoking cigarettes. She has been smoking about 3.00 packs per day. She has never used smokeless tobacco. She reports that she does not drink alcohol or use drugs.  ROS: UROLOGY Frequent Urination?: No Hard to postpone urination?: Yes Burning/pain with urination?: No Get up at night to urinate?: Yes Leakage of urine?: No Urine stream starts and stops?: Yes Trouble starting stream?: No Do you have to strain to urinate?: No Blood in urine?: Yes Urinary tract infection?: No Sexually transmitted disease?: No Injury to kidneys or bladder?: No Painful intercourse?: No Weak stream?: No Currently pregnant?: No Vaginal bleeding?: No Last menstrual period?: n  Gastrointestinal Nausea?: No Vomiting?: No Indigestion/heartburn?: No Diarrhea?: No Constipation?: No  Constitutional Fever: No Night sweats?: No Weight loss?: No Fatigue?: No  Skin Skin rash/lesions?: No Itching?: No  Eyes Blurred vision?: No Double vision?: No  Ears/Nose/Throat Sore throat?: No Sinus problems?: No   Hematologic/Lymphatic Swollen glands?: No Easy bruising?: No  Cardiovascular Leg swelling?: No Chest pain?: No  Respiratory Cough?: No Shortness of breath?: No  Endocrine Excessive thirst?: No  Musculoskeletal Back pain?: No Joint pain?: No  Neurological Headaches?: No Dizziness?: No  Psychologic Depression?: No Anxiety?: No  Physical Exam: BP 114/78   Pulse 75   Ht  (1.6 m)   Wt 221 lb (100.2 kg)   BMI 39.15 kg/m   Constitutional: Well nourished. Alert and oriented, No acute distress. HEENT: [place] AT, moist mucus membranes.  Trachea midline. Cardiovascular: No clubbing, cyanosis, or edema. Respiratory: Normal respiratory effort, no increased work of breathing. Neurologic: Grossly intact, no focal deficits, moving all 4 extremities. Psychiatric: Normal mood and affect.   Laboratory Data: Lab Results  Component Value Date   WBC 6.2 09/12/2017   HGB 15.1 09/12/2017   HCT 44.6 09/12/2017   MCV 89.2 09/12/2017   PLT 146 (L) 09/12/2017    Lab Results  Component Value Date   CREATININE 0.75 09/12/2017    No results found for: PSA  No results found for: TESTOSTERONE  No results found for: HGBA1C  No results found for: TSH  No results found for: CHOL, HDL, CHOLHDL, VLDL, LDLCALC  Lab Results  Component Value Date   AST 21 03/30/2014   Lab Results  Component Value Date   ALT 26 03/30/2014   No components found for: ALKALINEPHOPHATASE No components found for: BILIRUBINTOTAL  No results found for: ESTRADIOL  Urinalysis 3+ leukocytes, 2+ protein, 3+ RBC, > 30 WBC's, 3-10 RBC's, 0-10 epithelial cells, 0-10 renal epithelial cells and many bacteria.  See Epic.    I have reviewed the labs.  Assessment & Plan:    1. Mixed incontinence - Continue oxybutynin and Myrbetriq 25 mg daily - refills given   2. History of hematuria UA today is likely contaminated with vaginal discharge - did not have enough for culture and it was not a CATH UA Will  have patient return after COVID-19 precautions have lifted for CATH UA    No follow-ups on file.  Michiel Cowboy, PA-C  [facility] [address] [place], [place] [address] [phone]  I, Duanne Moron, am acting as a Neurosurgeon for [employer], PA-C.   I have reviewed the above documentation for accuracy and completeness, and I agree  with the above.    Michiel Cowboy, PA-C

## 2018-10-11 ENCOUNTER — Ambulatory Visit: Payer: Medicare Other | Admitting: Gastroenterology

## 2018-10-24 ENCOUNTER — Other Ambulatory Visit: Payer: Self-pay | Admitting: Internal Medicine

## 2018-10-24 DIAGNOSIS — B009 Herpesviral infection, unspecified: Secondary | ICD-10-CM

## 2018-10-24 DIAGNOSIS — F339 Major depressive disorder, recurrent, unspecified: Secondary | ICD-10-CM

## 2018-10-24 MED ORDER — FLUTICASONE PROPIONATE 50 MCG/ACT NA SUSP: 1.0000 | Freq: Every day | NASAL | 3 refills | Status: DC | PRN

## 2018-10-24 MED ORDER — SERTRALINE HCL 100 MG PO TABS: 150.0000 mg | ORAL_TABLET | Freq: Every day | ORAL | 0 refills | Status: DC

## 2018-10-24 MED ORDER — VALACYCLOVIR HCL 500 MG PO TABS: 500.0000 mg | ORAL_TABLET | Freq: Every day | ORAL | 4 refills | Status: DC

## 2018-11-21 ENCOUNTER — Ambulatory Visit: Payer: Medicare Other | Admitting: Gastroenterology

## 2018-11-30 ENCOUNTER — Other Ambulatory Visit: Payer: Self-pay | Admitting: Internal Medicine

## 2018-11-30 DIAGNOSIS — F339 Major depressive disorder, recurrent, unspecified: Secondary | ICD-10-CM

## 2018-12-01 ENCOUNTER — Other Ambulatory Visit: Payer: Self-pay

## 2018-12-01 MED ORDER — MECLIZINE HCL 25 MG PO TABS: ORAL_TABLET | ORAL | 0 refills | Status: DC

## 2018-12-02 ENCOUNTER — Other Ambulatory Visit: Payer: Self-pay | Admitting: Internal Medicine

## 2018-12-02 DIAGNOSIS — M797 Fibromyalgia: Secondary | ICD-10-CM

## 2018-12-02 NOTE — Telephone Encounter (Signed)
Prior authorization for Lyrica has been approved [place] [id number] good throught 07/20/2019

## 2018-12-05 ENCOUNTER — Other Ambulatory Visit: Payer: Self-pay | Admitting: Obstetrics and Gynecology

## 2018-12-05 ENCOUNTER — Other Ambulatory Visit (HOSPITAL_COMMUNITY): Payer: Self-pay | Admitting: Obstetrics and Gynecology

## 2018-12-05 DIAGNOSIS — N939 Abnormal uterine and vaginal bleeding, unspecified: Secondary | ICD-10-CM

## 2018-12-05 DIAGNOSIS — N898 Other specified noninflammatory disorders of vagina: Secondary | ICD-10-CM

## 2018-12-08 ENCOUNTER — Other Ambulatory Visit: Payer: Self-pay

## 2018-12-08 ENCOUNTER — Ambulatory Visit
Admission: RE | Admit: 2018-12-08 | Discharge: 2018-12-08 | Disposition: A | Discharge status: Home / Self Care | Payer: Medicare Other | Source: Ambulatory Visit | Attending: Obstetrics and Gynecology | Admitting: Obstetrics and Gynecology

## 2018-12-08 DIAGNOSIS — K5090 Crohn's disease, unspecified, without complications: Secondary | ICD-10-CM | POA: Insufficient documentation

## 2018-12-08 DIAGNOSIS — N939 Abnormal uterine and vaginal bleeding, unspecified: Secondary | ICD-10-CM | POA: Diagnosis present

## 2018-12-08 DIAGNOSIS — N898 Other specified noninflammatory disorders of vagina: Secondary | ICD-10-CM | POA: Diagnosis present

## 2018-12-08 DIAGNOSIS — Z90710 Acquired absence of both cervix and uterus: Secondary | ICD-10-CM | POA: Diagnosis not present

## 2018-12-08 LAB — POCT I-STAT CREATININE: Creatinine, Ser: 0.9 mg/dL (ref 0.44–1.00)

## 2018-12-08 MED ORDER — IOHEXOL 300 MG/ML  SOLN
75.0000 mL | Freq: Once | INTRAMUSCULAR | Status: AC | PRN
  Administered 2018-12-08: 75 mL via INTRAVENOUS

## 2018-12-19 DIAGNOSIS — I2609 Other pulmonary embolism with acute cor pulmonale: Secondary | ICD-10-CM | POA: Diagnosis not present

## 2018-12-19 DIAGNOSIS — R0600 Dyspnea, unspecified: Secondary | ICD-10-CM | POA: Diagnosis not present

## 2018-12-19 DIAGNOSIS — I2782 Chronic pulmonary embolism: Secondary | ICD-10-CM | POA: Diagnosis not present

## 2018-12-19 DIAGNOSIS — J449 Chronic obstructive pulmonary disease, unspecified: Secondary | ICD-10-CM | POA: Diagnosis not present

## 2018-12-20 ENCOUNTER — Ambulatory Visit
Payer: Medicare Other | Attending: Student in an Organized Health Care Education/Training Program | Admitting: Student in an Organized Health Care Education/Training Program

## 2018-12-20 ENCOUNTER — Encounter: Payer: Self-pay | Admitting: Student in an Organized Health Care Education/Training Program

## 2018-12-20 ENCOUNTER — Other Ambulatory Visit: Payer: Self-pay

## 2018-12-20 DIAGNOSIS — Z8639 Personal history of other endocrine, nutritional and metabolic disease: Secondary | ICD-10-CM

## 2018-12-20 DIAGNOSIS — J449 Chronic obstructive pulmonary disease, unspecified: Secondary | ICD-10-CM

## 2018-12-20 DIAGNOSIS — G894 Chronic pain syndrome: Secondary | ICD-10-CM | POA: Diagnosis not present

## 2018-12-20 DIAGNOSIS — G35 Multiple sclerosis: Secondary | ICD-10-CM

## 2018-12-20 DIAGNOSIS — M797 Fibromyalgia: Secondary | ICD-10-CM

## 2018-12-20 MED ORDER — HYDROCODONE-ACETAMINOPHEN 7.5-325 MG PO TABS: 1.0000 | ORAL_TABLET | Freq: Two times a day (BID) | ORAL | 0 refills | Status: AC | PRN

## 2018-12-20 NOTE — Progress Notes (Signed)
Pain Management Virtual Encounter Note - Virtual Visit via Video Conference Telehealth (real-time audio visits between healthcare provider and patient).  Patient's Phone No. & Preferred Pharmacy:  [phone] (home); [phone] (mobile); (Preferred) [phone] [email or web address]  [facility] [address] [place] - [address] [place] [address] Phone: [phone] Fax: [phone]  [facility] - [place], [place] - [address] [facility] [address] [place] [address] [place] [address] Phone: [phone] Fax: [phone]   Pre-screening note:  Our staff contacted Tracey Chandler and offered her an "in person", "face-to-face" appointment versus a telephone encounter. She indicated preferring the telephone encounter, at this time.  Reason for Virtual Visit: COVID-19*  Social distancing based on CDC and AMA recommendations.   I contacted Tracey Chandler on 12/20/2018 at 9:13 AM via video conference.      I clearly identified myself as Edward Jolly, MD. I verified that I was speaking with the correct person using two identifiers (Name and date of birth: 12-23-1965).  Advanced Informed Consent I sought verbal advanced consent from Tracey Chandler for virtual visit interactions. I informed Tracey Chandler of possible security and privacy concerns, risks, and limitations associated with providing "not-in-person" medical evaluation and management services. I also informed Tracey Chandler of the availability of "in-person" appointments. Finally, I informed her that there would be a charge for the virtual visit and that she could be  personally, fully or partially, financially responsible for it. Tracey Chandler expressed understanding and agreed to proceed.   Historic Elements   Tracey Chandler is a 53 y.o. year old, female patient evaluated today after her last encounter by our practice on 10/06/2018. Tracey Chandler  has a past medical history of Anxiety, Cervical dysplasia, COPD (chronic  obstructive pulmonary disease) (HCC), Discharge from the vagina (11/19/2014), DVT (deep venous thrombosis) (HCC), Fibromyalgia, GERD (gastroesophageal reflux disease), H/O: obesity (04/06/2014), Hematuria, Herpes, genital, History of blood clotting disorder, Hypertension, Microscopic hematuria (11/23/2015), Mixed incontinence, MS (multiple sclerosis) (HCC), Multiple sclerosis (HCC) (06/03/2015), OAB (overactive bladder), Obesity, Pulmonary embolism (HCC), Snores, and Stroke (HCC). She also  has a past surgical history that includes Abdominal hysterectomy; Bladder mesh; Chonca batosa; Dilation and curettage of uterus; Laser ablation condyloma cervical / vulvar; Cholecystectomy; Bladder suspension; Knee arthroscopy with medial menisectomy (Right, 08/23/2017); Synovectomy (Right, 08/23/2017); and Chondroplasty (Right, 08/23/2017). Tracey Chandler has a current medication list which includes the following prescription(s): hydrocodone-acetaminophen, augmented betamethasone dipropionate, buspirone, calcium carbonate-vitamin d, vitamin d3, fluticasone, hydrocodone-acetaminophen, lyrica, magic mouthwash w/lidocaine, meclizine, mirabegron er, oxybutynin, pantoprazole, sertraline, valacyclovir, and xarelto. She  reports that she has been smoking cigarettes. She has been smoking about 3.00 packs per day. She has never used smokeless tobacco. She reports that she does not drink alcohol or use drugs. Tracey Chandler is allergic to cefaclor; penicillins; and phenytoin.   HPI  Today, she is being contacted for medication management.  No change in medical history. Is in the process of moving so has been lifting more and exerting herself more. Hydrocodone #60 usually lasts patient 2-3 months. Refill as below. Patient in process of moving to [facility] and will need to find prescribing MD there she states as it is too far for her to come to [place]. Continues Lyrica as prescribed.  Pharmacotherapy Assessment   10/06/2018  3   10/06/2018   Hydrocodone-Acetamin 7.5-325  60.00 30 Bi Lat   [id number]   Nor (4575)   0  15.00 MME  Medicare   [place]  Monitoring: Pharmacotherapy: No side-effects or adverse reactions reported. [place] PMP: PDMP reviewed during this encounter.       Compliance: No problems identified. Effectiveness: Clinically acceptable. Plan: Refer to "POC".  Pertinent Labs  Renal Function Lab Results  Component Value Date   BUN 15 09/12/2017   CREATININE 0.90 12/08/2018   GFRAA >60 09/12/2017   GFRNONAA >60 09/12/2017   Hepatic Function Lab Results  Component Value Date   AST 21 03/30/2014   ALT 26 03/30/2014   ALBUMIN 3.1 (L) 03/30/2014   UDS Summary  Date Value Ref Range Status  02/01/2018 FINAL  Final    Comment:    ==================================================================== TOXASSURE COMP DRUG ANALYSIS,UR ==================================================================== Test                             Result       Flag       Units Drug Present not Declared for Prescription Verification   Pregabalin                     PRESENT      UNEXPECTED   Sertraline                     PRESENT      UNEXPECTED   Desmethylsertraline            PRESENT      UNEXPECTED    Desmethylsertraline is an expected metabolite of sertraline. Drug Absent but Declared for Prescription Verification   Hydrocodone                    Not Detected UNEXPECTED ng/mg creat   Acetaminophen                  Not Detected UNEXPECTED    Acetaminophen, as indicated in the declared medication list, is    not always detected even when used as directed. ==================================================================== Test                      Result    Flag   Units      Ref Range   Creatinine              97               mg/dL      >=67 ==================================================================== Declared Medications:  The flagging and interpretation on this report are based on the  following declared  medications.  Unexpected results may arise from  inaccuracies in the declared medications.  **Note: The testing scope of this panel includes these medications:  Hydrocodone (Hydrocodone-Acetaminophen)  **Note: The testing scope of this panel does not include small to  moderate amounts of these reported medications:  Acetaminophen (Hydrocodone-Acetaminophen)  **Note: The testing scope of this panel does not include following  reported medications:  Albuterol  Betamethasone  Buspirone  Calcium carbonate (Calcium Carb/Vitamin D)  Clobetasol (Temovate)  Fluticasone (Flonase)  Meclizine  Mirabegron (Myrbetriq)  Oxybutynin  Vitamin D (Calcium Carb/Vitamin D)  Vitamin D3 ==================================================================== For clinical consultation, please call [phone]. ====================================================================    Note: Above Lab results reviewed.  Recent imaging  CT PELVIS W CONTRAST CLINICAL DATA:  Vaginal discharge. History of Crohn's disease and hysterectomy  EXAM: CT PELVIS WITH CONTRAST  TECHNIQUE: Multidetector CT imaging of the pelvis was performed using the standard protocol following the bolus administration of intravenous contrast.  CONTRAST:  75mL OMNIPAQUE IOHEXOL 300 MG/ML  SOLN  COMPARISON:  None.  FINDINGS: Urinary Tract: Ureters are decompressed. Urinary bladder unremarkable.  Bowel:  Visualized pelvic large and small bowel unremarkable.  Vascular/Lymphatic: No evidence of aneurysm or adenopathy.  Reproductive:  Prior hysterectomy.  No adnexal mass.  Other: No free fluid or free air. No visible abnormality in the region of the vagina by CT.  Musculoskeletal: No acute bony abnormality. Degenerative changes in the lower lumbar spine.  IMPRESSION: No visible abnormality noted. If there is clinical concern for colovaginal fistula, consider repeating the study with rectal contrast for performing  barium enema.  Electronically Signed   By: Charlett NoseKevin  Dover M.D.   On: 12/08/2018 15:05  Assessment  The primary encounter diagnosis was Multiple sclerosis (HCC). Diagnoses of Fibromyalgia, Mild chronic obstructive pulmonary disease (HCC), H/O: obesity, and Chronic pain syndrome were also pertinent to this visit.  Plan of Care  I am having Tracey MasseJanice Horger start on HYDROcodone-acetaminophen. I am also having her maintain her Vitamin D3, Calcium Carbonate-Vitamin D (CALCIUM-VITAMIN D3 PO), augmented betamethasone dipropionate, busPIRone, Xarelto, Lyrica, magic mouthwash w/lidocaine, pantoprazole, oxybutynin, mirabegron ER, fluticasone, valACYclovir, sertraline, meclizine, and HYDROcodone-acetaminophen. Fill dates 12/20/18 and 02/18/19  Pharmacotherapy (Medications Ordered): Meds ordered this encounter  Medications  . HYDROcodone-acetaminophen (NORCO) 7.5-325 MG tablet    Sig: Take 1 tablet by mouth 2 (two) times daily as needed for moderate pain.    Dispense:  60 tablet    Refill:  0    Do not place this medication, or any other prescription from our practice, on "Automatic Refill". Patient may have prescription filled one day early if pharmacy is closed on scheduled refill date.  Marland Kitchen. HYDROcodone-acetaminophen (NORCO) 7.5-325 MG tablet    Sig: Take 1 tablet by mouth 2 (two) times daily as needed for moderate pain.    Dispense:  60 tablet    Refill:  0    Do not place this medication, or any other prescription from our practice, on "Automatic Refill". Patient may have prescription filled one day early if pharmacy is closed on scheduled refill date.   Orders:  No orders of the defined types were placed in this encounter.  Follow-up plan:   Return if symptoms worsen or fail to improve.    I discussed the assessment and treatment plan with the patient. The patient was provided an opportunity to ask questions and all were answered. The patient agreed with the plan and demonstrated an understanding of  the instructions.  Patient advised to call back or seek an in-person evaluation if the symptoms or condition worsens.  Total duration of non-face-to-face encounter: 25 minutes.  Note by: Edward JollyBilal Harpreet Pompey, MD Date: 12/20/2018; Time: 9:13 AM  Note: This dictation was prepared with Dragon dictation. Any transcriptional errors that may result from this process are unintentional.  Disclaimer:  * Given the special circumstances of the COVID-19 pandemic, the federal government has announced that the Office for Civil Rights (OCR) will exercise its enforcement discretion and will not impose penalties on physicians using telehealth in the event of noncompliance with regulatory requirements under the [employer] Insurance Portability and Accountability Act (HIPAA) in connection with the good faith provision of telehealth during the COVID-19 national public health emergency. (AMA)

## 2019-01-31 ENCOUNTER — Ambulatory Visit: Payer: Medicare Other | Admitting: Gastroenterology

## 2019-02-15 DIAGNOSIS — L659 Nonscarring hair loss, unspecified: Secondary | ICD-10-CM | POA: Diagnosis not present

## 2019-03-07 ENCOUNTER — Other Ambulatory Visit: Payer: Self-pay

## 2019-03-07 DIAGNOSIS — F339 Major depressive disorder, recurrent, unspecified: Secondary | ICD-10-CM

## 2019-03-07 MED ORDER — SERTRALINE HCL 100 MG PO TABS: 150.0000 mg | ORAL_TABLET | Freq: Every day | ORAL | 0 refills | Status: AC

## 2019-03-14 ENCOUNTER — Ambulatory Visit: Payer: Self-pay | Admitting: Nurse Practitioner

## 2019-03-23 ENCOUNTER — Other Ambulatory Visit: Payer: Self-pay

## 2019-03-29 ENCOUNTER — Other Ambulatory Visit: Payer: Self-pay

## 2019-03-29 ENCOUNTER — Ambulatory Visit (INDEPENDENT_AMBULATORY_CARE_PROVIDER_SITE_OTHER): Payer: Medicare Other | Admitting: Adult Health

## 2019-03-29 ENCOUNTER — Encounter: Payer: Self-pay | Admitting: Adult Health

## 2019-03-29 DIAGNOSIS — R42 Dizziness and giddiness: Secondary | ICD-10-CM

## 2019-03-29 DIAGNOSIS — M797 Fibromyalgia: Secondary | ICD-10-CM

## 2019-03-29 DIAGNOSIS — E559 Vitamin D deficiency, unspecified: Secondary | ICD-10-CM

## 2019-03-29 DIAGNOSIS — J45909 Unspecified asthma, uncomplicated: Secondary | ICD-10-CM | POA: Diagnosis not present

## 2019-03-29 MED ORDER — FLUTICASONE PROPIONATE 50 MCG/ACT NA SUSP: 1.0000 | Freq: Every day | NASAL | 3 refills | Status: DC | PRN

## 2019-03-29 MED ORDER — MECLIZINE HCL 25 MG PO TABS: ORAL_TABLET | ORAL | 0 refills | Status: DC

## 2019-03-29 MED ORDER — CALCIUM-VITAMIN D3 600-125 MG-UNIT PO TABS: 1.0000 | ORAL_TABLET | Freq: Every day | ORAL | 1 refills | Status: AC

## 2019-03-29 MED ORDER — LYRICA 150 MG PO CAPS: 150.0000 mg | ORAL_CAPSULE | Freq: Two times a day (BID) | ORAL | 1 refills | Status: DC

## 2019-03-29 NOTE — Progress Notes (Signed)
[facility] [place], [place] [address]  Internal MEDICINE  Telephone Visit  Patient Name: Tracey Chandler  [id number]  [id number]  Date of Service: 03/29/2019  I connected with the patient at 440 by telephone and verified the patients identity using two identifiers.   I discussed the limitations, risks, security and privacy concerns of performing an evaluation and management service by telephone and the availability of in person appointments. I also discussed with the patient that there may be a patient responsible charge related to the service.  The patient expressed understanding and agrees to proceed.    Chief Complaint  Patient presents with  . Telephone Assessment  . Telephone Screen  . Medication Refill    sertraline  . Medical Management of Chronic Issues    pt  is moving     HPI  PT is seen via video visit.  Patient reports that she is moving out of the area to be with her children.  She is in need of medication refills so that she can have some time to get a new provider in that area.  Overall she appears doing well and denies any overt needs at this time other than the refills.   Current Medication: Outpatient Encounter Medications as of 03/29/2019  Medication Sig  . busPIRone (BUSPAR) 5 MG tablet Take 1 tablet (5 mg total) by mouth 3 (three) times daily as needed.  . Cholecalciferol (VITAMIN D3) 2000 UNITS capsule Take 2,000 Units by mouth daily.   . fluticasone (FLONASE) 50 MCG/ACT nasal spray Place 1 spray into both nostrils daily as needed for allergies.  Marland Kitchen HYDROcodone-acetaminophen (NORCO) 7.5-325 MG tablet Take 1 tablet by mouth 2 (two) times daily as needed for moderate pain.  Marland Kitchen LYRICA 150 MG capsule Take 1 capsule (150 mg total) by mouth 2 (two) times daily.  . magic mouthwash w/lidocaine SOLN Take 5 mLs by mouth 4 (four) times daily.  . meclizine (ANTIVERT) 25 MG tablet TAKE 1 TABLET BY MOUTH 3  TIMES DAILY AS NEEDED FOR  DIZZINESS  .  mirabegron ER (MYRBETRIQ) 25 MG TB24 tablet Take 1 tablet (25 mg total) by mouth daily.  Marland Kitchen oxybutynin (DITROPAN XL) 15 MG 24 hr tablet Take 1 tablet (15 mg total) by mouth daily.  . pantoprazole (PROTONIX) 40 MG tablet Take 1 tablet (40 mg total) by mouth daily.  . sertraline (ZOLOFT) 100 MG tablet Take 1.5 tablets (150 mg total) by mouth daily.  . valACYclovir (VALTREX) 500 MG tablet Take 1 tablet (500 mg total) by mouth daily.  Alveda Reasons 20 MG TABS tablet Take 1 tablet (20 mg total) by mouth daily.  . [DISCONTINUED] augmented betamethasone dipropionate (DIPROLENE-AF) 0.05 % ointment Apply topically 2 (two) times daily.  . [DISCONTINUED] Calcium Carbonate-Vitamin D (CALCIUM-VITAMIN D3 PO) Take 1 tablet by mouth daily.  . [DISCONTINUED] fluticasone (FLONASE) 50 MCG/ACT nasal spray Place 1 spray into both nostrils daily as needed for allergies.  . [DISCONTINUED] LYRICA 150 MG capsule Take 1 capsule (150 mg total) by mouth 2 (two) times daily.  . [DISCONTINUED] meclizine (ANTIVERT) 25 MG tablet TAKE 1 TABLET BY MOUTH 3  TIMES DAILY AS NEEDED FOR  DIZZINESS  . Calcium Carbonate-Vitamin D (CALCIUM-VITAMIN D3) 600-125 MG-UNIT TABS Take 1 tablet by mouth daily.   No facility-administered encounter medications on file as of 03/29/2019.     Surgical History: Past Surgical History:  Procedure Laterality Date  . ABDOMINAL HYSTERECTOMY    . Bladder mesh    .  BLADDER SUSPENSION    . CHOLECYSTECTOMY    . Chonca batosa    . CHONDROPLASTY Right 08/23/2017   Procedure: CHONDROPLASTY;  Surgeon: Lyndle HerrlichBowers, James R, MD;  Location: [facility] ORS;  Service: Orthopedics;  Laterality: Right;  . DILATION AND CURETTAGE OF UTERUS    . KNEE ARTHROSCOPY WITH MEDIAL MENISECTOMY Right 08/23/2017   Procedure: KNEE ARTHROSCOPY WITH MEDIAL MENISECTOMY;  Surgeon: Lyndle HerrlichBowers, James R, MD;  Location: [facility] ORS;  Service: Orthopedics;  Laterality: Right;  . LASER ABLATION CONDYLOMA CERVICAL / VULVAR    . SYNOVECTOMY Right 08/23/2017    Procedure: SYNOVECTOMY;  Surgeon: Lyndle HerrlichBowers, James R, MD;  Location: [facility] ORS;  Service: Orthopedics;  Laterality: Right;    Medical History: Past Medical History:  Diagnosis Date  . Anxiety   . Cervical dysplasia   . COPD (chronic obstructive pulmonary disease) (HCC)   . Discharge from the vagina 11/19/2014  . DVT (deep venous thrombosis) (HCC)   . Fibromyalgia   . GERD (gastroesophageal reflux disease)   . H/O: obesity 04/06/2014  . Hematuria   . Herpes, genital   . History of blood clotting disorder   . Hypertension    lost weight/no meds for 10 years  . Microscopic hematuria 11/23/2015  . Mixed incontinence   . MS (multiple sclerosis) (HCC)   . Multiple sclerosis (HCC) 06/03/2015  . OAB (overactive bladder)   . Obesity   . Pulmonary embolism (HCC)   . Snores   . Stroke [facility](HCC)     Family History: Family History  Problem Relation Age of Onset  . Hypertension Mother   . Hypertension Father   . Prostate cancer Paternal Uncle   . Kidney cancer Neg Hx   . Bladder Cancer Neg Hx     Social History   Socioeconomic History  . Marital status: Married    Spouse name: Not on file  . Number of children: Not on file  . Years of education: Not on file  . Highest education level: Not on file  Occupational History  . Not on file  Social Needs  . Financial resource strain: Not on file  . Food insecurity    Worry: Not on file    Inability: Not on file  . Transportation needs    Medical: Not on file    Non-medical: Not on file  Tobacco Use  . Smoking status: Current Every Day Smoker    Packs/day: 3.00    Types: Cigarettes    Last attempt to quit: 08/15/2017    Years since quitting: 1.6  . Smokeless tobacco: Never Used  Substance and Sexual Activity  . Alcohol use: No    Alcohol/week: 0.0 standard drinks  . Drug use: No  . Sexual activity: Not on file  Lifestyle  . Physical activity    Days per week: Not on file    Minutes per session: Not on file  . Stress: Not on file   Relationships  . Social Musicianconnections    Talks on phone: Not on file    Gets together: Not on file    Attends religious service: Not on file    Active member of club or organization: Not on file    Attends meetings of clubs or organizations: Not on file    Relationship status: Not on file  . Intimate partner violence    Fear of current or ex partner: Not on file    Emotionally abused: Not on file    Physically abused: Not on file  Forced sexual activity: Not on file  Other Topics Concern  . Not on file  Social History Narrative  . Not on file      Review of Systems  Constitutional: Negative for chills, fatigue and unexpected weight change.  HENT: Negative for congestion, rhinorrhea, sneezing and sore throat.   Eyes: Negative for photophobia, pain and redness.  Respiratory: Negative for cough, chest tightness and shortness of breath.   Cardiovascular: Negative for chest pain and palpitations.  Gastrointestinal: Negative for abdominal pain, constipation, diarrhea, nausea and vomiting.  Endocrine: Negative.   Genitourinary: Negative for dysuria and frequency.  Musculoskeletal: Negative for arthralgias, back pain, joint swelling and neck pain.  Skin: Negative for rash.  Allergic/Immunologic: Negative.   Neurological: Negative for tremors and numbness.  Hematological: Negative for adenopathy. Does not bruise/bleed easily.  Psychiatric/Behavioral: Negative for behavioral problems and sleep disturbance. The patient is not nervous/anxious.     Vital Signs: There were no vitals taken for this visit.   Observation/Objective: Well appearing, NAD noted.    Assessment/Plan: 1. Fibromyalgia Refill patient's Lyrica at this time. - LYRICA 150 MG capsule; Take 1 capsule (150 mg total) by mouth 2 (two) times daily.  Dispense: 180 capsule; Refill: 1  2. Asthma due to environmental allergies Sent refills for Flonase. - fluticasone (FLONASE) 50 MCG/ACT nasal spray; Place 1 spray  into both nostrils daily as needed for allergies.  Dispense: 16 g; Refill: 3  3. Vertigo Refill patient's meclizine for vertigo. - meclizine (ANTIVERT) 25 MG tablet; TAKE 1 TABLET BY MOUTH 3  TIMES DAILY AS NEEDED FOR  DIZZINESS  Dispense: 270 tablet; Refill: 0  4. Vitamin D deficiency - Calcium Carbonate-Vitamin D (CALCIUM-VITAMIN D3) 600-125 MG-UNIT TABS; Take 1 tablet by mouth daily.  Dispense: 30 tablet; Refill: 1  General Counseling: Liborio NixonJanice verbalizes understanding of the findings of today's phone visit and agrees with plan of treatment. I have discussed any further diagnostic evaluation that may be needed or ordered today. We also reviewed her medications today. she has been encouraged to call the office with any questions or concerns that should arise related to todays visit.    No orders of the defined types were placed in this encounter.   Meds ordered this encounter  Medications  . Calcium Carbonate-Vitamin D (CALCIUM-VITAMIN D3) 600-125 MG-UNIT TABS    Sig: Take 1 tablet by mouth daily.    Dispense:  30 tablet    Refill:  1  . fluticasone (FLONASE) 50 MCG/ACT nasal spray    Sig: Place 1 spray into both nostrils daily as needed for allergies.    Dispense:  16 g    Refill:  3  . LYRICA 150 MG capsule    Sig: Take 1 capsule (150 mg total) by mouth 2 (two) times daily.    Dispense:  180 capsule    Refill:  1  . meclizine (ANTIVERT) 25 MG tablet    Sig: TAKE 1 TABLET BY MOUTH 3  TIMES DAILY AS NEEDED FOR  DIZZINESS    Dispense:  270 tablet    Refill:  0    Time spent:15 Minutes    Blima LedgerAdam Rey Dansby AGNP-C Internal medicine

## 2019-05-15 DIAGNOSIS — L639 Alopecia areata, unspecified: Secondary | ICD-10-CM | POA: Diagnosis not present

## 2019-05-15 DIAGNOSIS — L659 Nonscarring hair loss, unspecified: Secondary | ICD-10-CM | POA: Diagnosis not present

## 2019-05-15 DIAGNOSIS — L648 Other androgenic alopecia: Secondary | ICD-10-CM | POA: Diagnosis not present

## 2019-05-15 DIAGNOSIS — L638 Other alopecia areata: Secondary | ICD-10-CM | POA: Diagnosis not present

## 2019-05-29 ENCOUNTER — Telehealth: Payer: Self-pay | Admitting: Student in an Organized Health Care Education/Training Program

## 2019-05-29 NOTE — Telephone Encounter (Signed)
Dr Holley Raring, as you are aware from your note, this patient has moved out of town.  She still has one script for Hydrocodone that [facility] [place].  They will not transfer it and she wanted to see if you would [facility].

## 2019-05-29 NOTE — Telephone Encounter (Signed)
Patient moved to [facility] and wants to know if her pain med can be transferred to the pharmacy she is using now  [facility] [phone] [place], [place]

## 2019-05-31 ENCOUNTER — Other Ambulatory Visit: Payer: Self-pay

## 2019-05-31 ENCOUNTER — Telehealth: Payer: Self-pay

## 2019-05-31 MED ORDER — HYDROCODONE-ACETAMINOPHEN 7.5-325 MG PO TABS: 1.0000 | ORAL_TABLET | Freq: Two times a day (BID) | ORAL | 0 refills | Status: AC | PRN

## 2019-05-31 NOTE — Telephone Encounter (Signed)
Rx sent, please call previous pharmacy to cancel script.  Requested Prescriptions   Signed Prescriptions Disp Refills  . HYDROcodone-acetaminophen (NORCO) 7.5-325 MG tablet 60 tablet 0    Sig: Take 1 tablet by mouth 2 (two) times daily as needed for severe pain. Must last 30 days.    Authorizing Provider: Gillis Santa

## 2019-05-31 NOTE — Telephone Encounter (Signed)
Dr Holley Raring, I sent you a message 2 days ago and I never heard anything.  I changed the pharmacy in [place]. The original message was that she has moved and [facility] script [facility] here in town and she would like to know if you will resend it.  Thank you

## 2019-05-31 NOTE — Telephone Encounter (Signed)
[facility] [place] per Thurmond Butts the pharmacist.

## 2019-05-31 NOTE — Telephone Encounter (Signed)
She states she spoke with someone on the 9th and they were supposed to talk to Dr. Holley Raring about her medicine  and call her back and she never got a call back.

## 2019-06-02 DIAGNOSIS — Z7901 Long term (current) use of anticoagulants: Secondary | ICD-10-CM | POA: Diagnosis not present

## 2019-06-02 DIAGNOSIS — Z882 Allergy status to sulfonamides status: Secondary | ICD-10-CM | POA: Diagnosis not present

## 2019-06-02 DIAGNOSIS — M79602 Pain in left arm: Secondary | ICD-10-CM | POA: Diagnosis not present

## 2019-06-02 DIAGNOSIS — Z880 Allergy status to penicillin: Secondary | ICD-10-CM | POA: Diagnosis not present

## 2019-06-02 DIAGNOSIS — Z79899 Other long term (current) drug therapy: Secondary | ICD-10-CM | POA: Diagnosis not present

## 2019-06-02 DIAGNOSIS — Z881 Allergy status to other antibiotic agents status: Secondary | ICD-10-CM | POA: Diagnosis not present

## 2019-06-02 DIAGNOSIS — S4992XA Unspecified injury of left shoulder and upper arm, initial encounter: Secondary | ICD-10-CM | POA: Diagnosis not present

## 2019-06-02 DIAGNOSIS — M7989 Other specified soft tissue disorders: Secondary | ICD-10-CM | POA: Diagnosis not present

## 2019-06-02 DIAGNOSIS — G8911 Acute pain due to trauma: Secondary | ICD-10-CM | POA: Diagnosis not present

## 2019-07-14 ENCOUNTER — Other Ambulatory Visit: Payer: Self-pay | Admitting: Adult Health

## 2019-07-14 DIAGNOSIS — J45909 Unspecified asthma, uncomplicated: Secondary | ICD-10-CM

## 2019-07-14 DIAGNOSIS — R42 Dizziness and giddiness: Secondary | ICD-10-CM

## 2019-07-14 DIAGNOSIS — M797 Fibromyalgia: Secondary | ICD-10-CM

## 2019-09-07 ENCOUNTER — Other Ambulatory Visit: Payer: Self-pay | Admitting: Urology

## 2019-09-07 DIAGNOSIS — N3941 Urge incontinence: Secondary | ICD-10-CM

## 2019-09-11 ENCOUNTER — Other Ambulatory Visit: Payer: Self-pay

## 2019-09-11 DIAGNOSIS — I2699 Other pulmonary embolism without acute cor pulmonale: Secondary | ICD-10-CM

## 2019-09-11 MED ORDER — XARELTO 20 MG PO TABS: 20.0000 mg | ORAL_TABLET | Freq: Every day | ORAL | 0 refills | Status: DC

## 2019-09-12 ENCOUNTER — Telehealth: Payer: Self-pay

## 2019-09-12 NOTE — Telephone Encounter (Signed)
Called lmom informing patient need to schedule awv. klh

## 2019-10-04 ENCOUNTER — Other Ambulatory Visit: Payer: Self-pay | Admitting: Urology

## 2019-10-04 DIAGNOSIS — N3946 Mixed incontinence: Secondary | ICD-10-CM

## 2019-10-10 ENCOUNTER — Ambulatory Visit: Payer: Medicare Other | Admitting: Urology

## 2019-10-20 ENCOUNTER — Other Ambulatory Visit: Payer: Self-pay | Admitting: Urology

## 2019-10-20 ENCOUNTER — Other Ambulatory Visit: Payer: Self-pay | Admitting: Internal Medicine

## 2019-10-20 DIAGNOSIS — B009 Herpesviral infection, unspecified: Secondary | ICD-10-CM

## 2019-10-20 DIAGNOSIS — N3946 Mixed incontinence: Secondary | ICD-10-CM

## 2019-10-23 ENCOUNTER — Other Ambulatory Visit: Payer: Self-pay

## 2019-10-23 DIAGNOSIS — K219 Gastro-esophageal reflux disease without esophagitis: Secondary | ICD-10-CM

## 2019-10-23 MED ORDER — PANTOPRAZOLE SODIUM 40 MG PO TBEC: 40.0000 mg | DELAYED_RELEASE_TABLET | Freq: Every day | ORAL | 0 refills | Status: DC

## 2019-11-06 ENCOUNTER — Other Ambulatory Visit: Payer: Self-pay

## 2019-11-06 DIAGNOSIS — B009 Herpesviral infection, unspecified: Secondary | ICD-10-CM

## 2019-11-06 MED ORDER — VALACYCLOVIR HCL 500 MG PO TABS: 500.0000 mg | ORAL_TABLET | Freq: Every day | ORAL | 0 refills | Status: AC

## 2019-11-27 ENCOUNTER — Other Ambulatory Visit: Payer: Self-pay | Admitting: Adult Health

## 2019-11-27 DIAGNOSIS — I2699 Other pulmonary embolism without acute cor pulmonale: Secondary | ICD-10-CM

## 2020-02-12 ENCOUNTER — Other Ambulatory Visit: Payer: Self-pay | Admitting: Adult Health

## 2020-02-12 DIAGNOSIS — K219 Gastro-esophageal reflux disease without esophagitis: Secondary | ICD-10-CM

## 2020-02-16 ENCOUNTER — Other Ambulatory Visit: Payer: Self-pay | Admitting: Adult Health

## 2020-02-16 DIAGNOSIS — I2699 Other pulmonary embolism without acute cor pulmonale: Secondary | ICD-10-CM
